# Patient Record
Sex: Male | Born: 1955 | Race: White | Hispanic: No | Marital: Married | Smoking: Former smoker
Health system: Southern US, Community
[De-identification: ages and names within clinical notes are randomized; demographics above are authoritative.]

## PROBLEM LIST (undated history)

## (undated) DIAGNOSIS — M1990 Unspecified osteoarthritis, unspecified site: Secondary | ICD-10-CM

## (undated) DIAGNOSIS — R3129 Other microscopic hematuria: Secondary | ICD-10-CM

## (undated) DIAGNOSIS — Z950 Presence of cardiac pacemaker: Secondary | ICD-10-CM

## (undated) DIAGNOSIS — E785 Hyperlipidemia, unspecified: Secondary | ICD-10-CM

## (undated) DIAGNOSIS — C61 Malignant neoplasm of prostate: Secondary | ICD-10-CM

## (undated) DIAGNOSIS — G7000 Myasthenia gravis without (acute) exacerbation: Secondary | ICD-10-CM

## (undated) DIAGNOSIS — M109 Gout, unspecified: Secondary | ICD-10-CM

## (undated) DIAGNOSIS — Z8619 Personal history of other infectious and parasitic diseases: Secondary | ICD-10-CM

## (undated) DIAGNOSIS — Z22322 Carrier or suspected carrier of Methicillin resistant Staphylococcus aureus: Secondary | ICD-10-CM

## (undated) DIAGNOSIS — I4891 Unspecified atrial fibrillation: Secondary | ICD-10-CM

## (undated) DIAGNOSIS — I10 Essential (primary) hypertension: Secondary | ICD-10-CM

## (undated) DIAGNOSIS — M21371 Foot drop, right foot: Secondary | ICD-10-CM

## (undated) DIAGNOSIS — R011 Cardiac murmur, unspecified: Secondary | ICD-10-CM

## (undated) DIAGNOSIS — R972 Elevated prostate specific antigen [PSA]: Secondary | ICD-10-CM

## (undated) DIAGNOSIS — H269 Unspecified cataract: Secondary | ICD-10-CM

## (undated) DIAGNOSIS — T7840XA Allergy, unspecified, initial encounter: Secondary | ICD-10-CM

## (undated) HISTORY — DX: Presence of cardiac pacemaker: Z95.0

## (undated) HISTORY — PX: COLONOSCOPY W/ POLYPECTOMY: SHX1380

## (undated) HISTORY — DX: Unspecified cataract: H26.9

## (undated) HISTORY — DX: Essential (primary) hypertension: I10

## (undated) HISTORY — PX: CARPAL TUNNEL RELEASE: SHX101

## (undated) HISTORY — DX: Hyperlipidemia, unspecified: E78.5

## (undated) HISTORY — DX: Gout, unspecified: M10.9

## (undated) HISTORY — PX: MOHS SURGERY: SUR867

## (undated) HISTORY — DX: Malignant neoplasm of prostate: C61

## (undated) HISTORY — DX: Other microscopic hematuria: R31.29

## (undated) HISTORY — DX: Cardiac murmur, unspecified: R01.1

## (undated) HISTORY — PX: SPINE SURGERY: SHX786

## (undated) HISTORY — DX: Personal history of other infectious and parasitic diseases: Z86.19

## (undated) HISTORY — PX: JOINT REPLACEMENT: SHX530

## (undated) HISTORY — PX: MELANOMA EXCISION: SHX5266

## (undated) HISTORY — DX: Allergy, unspecified, initial encounter: T78.40XA

## (undated) HISTORY — DX: Unspecified osteoarthritis, unspecified site: M19.90

## (undated) HISTORY — DX: Elevated prostate specific antigen (PSA): R97.20

## (undated) HISTORY — PX: EYE SURGERY: SHX253

---

## 1986-11-01 HISTORY — PX: KNEE SURGERY: SHX244

## 2000-12-02 DIAGNOSIS — I10 Essential (primary) hypertension: Secondary | ICD-10-CM | POA: Insufficient documentation

## 2003-07-22 DIAGNOSIS — G7000 Myasthenia gravis without (acute) exacerbation: Secondary | ICD-10-CM

## 2006-11-30 ENCOUNTER — Ambulatory Visit: Payer: Self-pay | Admitting: Gastroenterology

## 2009-05-08 ENCOUNTER — Ambulatory Visit: Payer: Self-pay | Admitting: Family Medicine

## 2012-12-28 HISTORY — PX: BASAL CELL CARCINOMA EXCISION: SHX1214

## 2014-10-16 DIAGNOSIS — I442 Atrioventricular block, complete: Secondary | ICD-10-CM | POA: Insufficient documentation

## 2014-10-17 LAB — BASIC METABOLIC PANEL
BUN: 17 mg/dL (ref 4–21)
CREATININE: 1.3 mg/dL (ref 0.6–1.3)
Glucose: 115 mg/dL
POTASSIUM: 4.6 mmol/L (ref 3.4–5.3)
SODIUM: 143 mmol/L (ref 137–147)

## 2014-10-17 LAB — LIPID PANEL
Cholesterol: 163 mg/dL (ref 0–200)
HDL: 37 mg/dL (ref 35–70)
LDL Cholesterol: 98 mg/dL
Triglycerides: 138 mg/dL (ref 40–160)

## 2014-10-17 LAB — TSH: TSH: 3.38 u[IU]/mL (ref 0.41–5.90)

## 2014-11-21 DIAGNOSIS — R001 Bradycardia, unspecified: Secondary | ICD-10-CM | POA: Insufficient documentation

## 2014-11-21 DIAGNOSIS — R0681 Apnea, not elsewhere classified: Secondary | ICD-10-CM | POA: Insufficient documentation

## 2014-11-27 LAB — PSA: PSA: 5.5

## 2014-12-11 ENCOUNTER — Ambulatory Visit: Payer: Self-pay | Admitting: Internal Medicine

## 2014-12-23 ENCOUNTER — Ambulatory Visit: Payer: Self-pay | Admitting: Cardiology

## 2014-12-23 DIAGNOSIS — I10 Essential (primary) hypertension: Secondary | ICD-10-CM

## 2014-12-24 HISTORY — PX: INSERT / REPLACE / REMOVE PACEMAKER: SUR710

## 2014-12-25 ENCOUNTER — Ambulatory Visit: Payer: Self-pay | Admitting: Cardiology

## 2014-12-30 DIAGNOSIS — Z950 Presence of cardiac pacemaker: Secondary | ICD-10-CM | POA: Insufficient documentation

## 2015-02-24 ENCOUNTER — Ambulatory Visit: Admit: 2015-02-24 | Payer: Self-pay | Admitting: Radiation Oncology

## 2015-03-01 LAB — PSA: PSA: 5.1

## 2015-03-02 NOTE — Op Note (Signed)
PATIENT NAME:  Garrett Woods, Garrett Woods MR#:  [id number] DATE OF BIRTH:  September 22, 1956  DATE OF PROCEDURE:  12/25/2014  PRIMARY CARE PHYSICIAN:  Lelon Huh, MD  PREOPERATIVE DIAGNOSIS: Type II second-degree atrioventricular block.   PROCEDURE: Dual-chamber pacemaker implantation.   POSTPROCEDURE DIAGNOSIS: Atrial sensing with ventricular pacing.   INDICATION: The patient is a 59 year old gentleman with a history of near syncope, palpitations, and exertional dyspnea. The patient was noted to be bradycardic with type II second degree AV block.   The risks, benefits, and alternatives of permanent pacemaker implantation were explained to the patient and informed written consent was obtained.   DESCRIPTION OF PROCEDURE: He was brought to the Operating Room in a fasting state. The left pectoral region was prepped and draped in the usual sterile manner. Anesthesia was obtained with 1% Xylocaine locally. A 6 cm incision was performed over the left pectoral region. The pacemaker pocket was generated by electrocautery and blunt dissection. Access was obtained to the left subclavian vein by fine needle aspiration. MRI compatible ventricular and atrial leads were positioned into the right ventricular apical septum and right atrial appendage after fluoroscopic guidance. After thresholds were obtained, the leads were sutured in place. The leads were connected to a dual chamber rate-responsive MRI compatible pacemaker generator ([employer] [id number] Advisa). The pacemaker pocket was irrigated with 10 mL solution. The new pacemaker generator was positioned in the pocket. The pocket was closed with 2-0 and 4-0 Vicryl, respectively. Steri-Strips as pressure dressing were applied.   ____________________________ Isaias Cowman, MD ap:mc D: 12/25/2014 13:36:57 ET T: 12/26/2014 08:13:35 ET JOB#: [id number]  cc: Isaias Cowman, MD, <Dictator> Isaias Cowman MD ELECTRONICALLY SIGNED 01/07/2015 10:08

## 2015-03-06 ENCOUNTER — Ambulatory Visit: Payer: 59 | Admitting: Radiation Oncology

## 2015-03-06 ENCOUNTER — Encounter (INDEPENDENT_AMBULATORY_CARE_PROVIDER_SITE_OTHER): Payer: Self-pay

## 2015-03-06 ENCOUNTER — Ambulatory Visit
Admission: RE | Admit: 2015-03-06 | Discharge: 2015-03-06 | Disposition: A | Discharge status: Home / Self Care | Payer: 59 | Source: Ambulatory Visit | Attending: Radiation Oncology | Admitting: Radiation Oncology

## 2015-03-06 ENCOUNTER — Encounter: Payer: Self-pay | Admitting: Radiation Oncology

## 2015-03-06 VITALS — BP 162/93 | HR 96 | Temp 98.1°F | Resp 20 | Ht 72.0 in | Wt 357.1 lb

## 2015-03-06 DIAGNOSIS — C61 Malignant neoplasm of prostate: Secondary | ICD-10-CM | POA: Insufficient documentation

## 2015-03-06 DIAGNOSIS — Z510 Encounter for antineoplastic radiation therapy: Secondary | ICD-10-CM | POA: Insufficient documentation

## 2015-03-06 NOTE — Consult Note (Signed)
Radiation Oncology NEW PATIENT EVALUATION  Name: Garrett Woods  MRN: [id number]  Date:   03/06/2015     DOB: September 16, 1956   This 59 y.o. male patient presents to the clinic for initial evaluation of Prostate cancer.  REFERRING PHYSICIAN: Birdie Sons, MD  CHIEF COMPLAINT:  Chief Complaint  Patient presents with  . Prostate Cancer    Initial evaluation of prostate caner    DIAGNOSIS: The encounter diagnosis was Prostate cancer.   PREVIOUS INVESTIGATIONS:  Pathology Report Imaging Referral Report All reviewed HPI: Patient is a 59 year old male with very little your lower urinary tract symptoms presented with an elevated PSA of 5. 10/01/2014. Digital rectal exam was difficult because the patient is morbidly obese. He went on to have transrectal ultrasound-guided biopsy showing 3 of 12 cores positive for Gleason 6 (3+3) adenocarcinoma. Patient does have a pacemaker. He is also had melanoma excision in the past. Treatment options by urology have been recommended. His prostate volume is 72 g making it over the limit for seed implantation. Based on his body habitus pacemaker recognition for robotic prostatectomy was not made. He is seen today for consideration of radiation treatments.  PLANNED TREATMENT REGIMEN: Image guided radiation therapy using I am RT  PAST MEDICAL HISTORY:  has a past medical history of Arthritis; Elevated PSA; Prostate cancer; Microscopic hematuria; Gout; Heart murmur; Hypertension; Hyperlipemia; and Pacemaker.    PAST SURGICAL HISTORY:  Past Surgical History  Procedure Laterality Date  . Knee surgery Right   . Melanoma excision      FAMILY HISTORY: family history is not on file.  SOCIAL HISTORY:  reports that he has quit smoking. He does not have any smokeless tobacco history on file. He reports that he drinks alcohol.  ALLERGIES: Review of patient's allergies indicates no known allergies.  MEDICATIONS:  Current Outpatient Prescriptions   Medication Sig Dispense Refill  . diclofenac (VOLTAREN) 50 MG EC tablet Take 50 mg by mouth daily as needed.    . Glucosamine-Chondroitin 250-200 MG TABS Take 1 tablet by mouth daily.    . imiquimod (ALDARA) 5 % cream Apply 1 application topically daily.    Marland Kitchen losartan-hydrochlorothiazide (HYZAAR) 100-25 MG per tablet Take 1 tablet by mouth daily.    Marland Kitchen lovastatin (MEVACOR) 40 MG tablet Take 40 mg by mouth at bedtime.     No current facility-administered medications for this encounter.    ECOG PERFORMANCE STATUS:  0 - Asymptomatic  REVIEW OF SYSTEMS:  Patient denies any weight loss, fatigue, weakness, fever, chills or night sweats. Patient denies any loss of vision, blurred vision. Patient denies any ringing  of the ears or hearing loss. No irregular heartbeat. Patient denies heart murmur or history of fainting. Patient denies any chest pain or pain radiating to her upper extremities. Patient denies any shortness of breath, difficulty breathing at night, cough or hemoptysis. Patient denies any swelling in the lower legs. Patient denies any nausea vomiting, vomiting of blood, or coffee ground material in the vomitus. Patient denies any stomach pain. Patient states has had normal bowel movements no significant constipation or diarrhea. Patient denies any dysuria, hematuria or significant nocturia. Patient denies any problems walking, swelling in the joints or loss of balance. Patient denies any skin changes, loss of hair or loss of weight. Patient denies any excessive worrying or anxiety or significant depression. Patient denies any problems with insomnia. Patient denies excessive thirst, polyuria, polydipsia. Patient denies any swollen glands, patient denies easy bruising or easy bleeding. Patient  denies any recent infections, allergies or URI. Patient "s visual fields have not changed significantly in recent time.    PHYSICAL EXAM: BP 162/93 mmHg  Pulse 96  Temp(Src) 98.1 F (36.7 C)  Resp 20   Ht 6' (1.829 m)  Wt 357 lb 2.3 oz (162 kg)  BMI 48.43 kg/m2 Well-developed obese male in NAD. Lungs are clear to A&P he has a 2/6 systolic ejection murmur. Abdomen is benign. On rectal exam rectal sphincter tone is good. Prostate exam is difficult secondary to his body habitus. No other rectal abnormalities are identified. Well-developed well-nourished patient in NAD. HEENT reveals PERLA, EOMI, discs not visualized.  Oral cavity is clear. No oral mucosal lesions are identified. Neck is clear without evidence of cervical or supraclavicular adenopathy. Lungs are clear to A&P. Cardiac examination is essentially unremarkable with regular rate and rhythm without murmur rub or thrill. Abdomen is benign with no organomegaly or masses noted. Motor sensory and DTR levels are equal and symmetric in the upper and lower extremities. Cranial nerves II through XII are grossly intact. Proprioception is intact. No peripheral adenopathy or edema is identified. No motor or sensory levels are noted. Crude visual fields are within normal range.   LABORATORY DATA:  No results found for this or any previous visit (from the past 72 hour(s)).   RADIOLOGY RESULTS: No results found.  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  PLAN: At this time we have discussed observation and surgical alternatives. Based on neurology's recommendation do not feel he is in good candidate for surgical resection based on his body habitus and pacemaker. Prostate volume is over the limit for seed implantation in and and I would recommend image guided radiation therapy up to 8000 cGy over 8 weeks using IMRT treatment planning and delivery. I have asked urology to place gold fiduciary markers for daily image guided therapy. Risks and benefits of treatment including increasing lower urinary tract symptoms, possible diarrhea, fatigue, alteration of blood counts, all were explained in  detail to the patient. He seems to comprehend my treatment plan well. I have ordered and scheduled CT simulation shortly after markers are placed.  I would like to take this opportunity for allowing me to participate in the care of your patient.Armstead Peaks., MD

## 2015-03-18 ENCOUNTER — Encounter: Payer: Self-pay | Admitting: *Deleted

## 2015-03-20 ENCOUNTER — Ambulatory Visit
Admission: RE | Admit: 2015-03-20 | Discharge: 2015-03-20 | Disposition: A | Discharge status: Home / Self Care | Payer: 59 | Source: Ambulatory Visit | Attending: Radiation Oncology | Admitting: Radiation Oncology

## 2015-03-20 DIAGNOSIS — Z510 Encounter for antineoplastic radiation therapy: Secondary | ICD-10-CM | POA: Diagnosis not present

## 2015-03-20 DIAGNOSIS — C61 Malignant neoplasm of prostate: Secondary | ICD-10-CM | POA: Diagnosis present

## 2015-03-24 ENCOUNTER — Ambulatory Visit
Admission: RE | Admit: 2015-03-24 | Discharge: 2015-03-24 | Disposition: A | Discharge status: Home / Self Care | Payer: 59 | Source: Ambulatory Visit | Attending: Family Medicine | Admitting: Family Medicine

## 2015-03-24 ENCOUNTER — Other Ambulatory Visit: Payer: Self-pay | Admitting: Family Medicine

## 2015-03-24 DIAGNOSIS — M5431 Sciatica, right side: Secondary | ICD-10-CM

## 2015-03-24 DIAGNOSIS — M5430 Sciatica, unspecified side: Secondary | ICD-10-CM | POA: Diagnosis present

## 2015-03-24 DIAGNOSIS — M5432 Sciatica, left side: Principal | ICD-10-CM

## 2015-03-25 DIAGNOSIS — C61 Malignant neoplasm of prostate: Secondary | ICD-10-CM | POA: Diagnosis not present

## 2015-03-27 ENCOUNTER — Encounter: Payer: Self-pay | Admitting: *Deleted

## 2015-03-28 ENCOUNTER — Other Ambulatory Visit: Payer: Self-pay | Admitting: *Deleted

## 2015-03-28 DIAGNOSIS — C61 Malignant neoplasm of prostate: Secondary | ICD-10-CM

## 2015-04-01 ENCOUNTER — Ambulatory Visit
Admission: RE | Admit: 2015-04-01 | Discharge: 2015-04-01 | Disposition: A | Discharge status: Home / Self Care | Payer: 59 | Source: Ambulatory Visit | Attending: Radiation Oncology | Admitting: Radiation Oncology

## 2015-04-01 DIAGNOSIS — C61 Malignant neoplasm of prostate: Secondary | ICD-10-CM | POA: Diagnosis not present

## 2015-04-02 ENCOUNTER — Ambulatory Visit
Admission: RE | Admit: 2015-04-02 | Discharge: 2015-04-02 | Disposition: A | Discharge status: Home / Self Care | Payer: 59 | Source: Ambulatory Visit | Attending: Radiation Oncology | Admitting: Radiation Oncology

## 2015-04-02 DIAGNOSIS — C61 Malignant neoplasm of prostate: Secondary | ICD-10-CM | POA: Diagnosis not present

## 2015-04-03 ENCOUNTER — Ambulatory Visit
Admission: RE | Admit: 2015-04-03 | Discharge: 2015-04-03 | Disposition: A | Discharge status: Home / Self Care | Payer: 59 | Source: Ambulatory Visit | Attending: Radiation Oncology | Admitting: Radiation Oncology

## 2015-04-03 DIAGNOSIS — C61 Malignant neoplasm of prostate: Secondary | ICD-10-CM | POA: Diagnosis not present

## 2015-04-04 ENCOUNTER — Ambulatory Visit
Admission: RE | Admit: 2015-04-04 | Discharge: 2015-04-04 | Disposition: A | Discharge status: Home / Self Care | Payer: 59 | Source: Ambulatory Visit | Attending: Radiation Oncology | Admitting: Radiation Oncology

## 2015-04-04 ENCOUNTER — Other Ambulatory Visit: Payer: Self-pay | Admitting: Family Medicine

## 2015-04-04 DIAGNOSIS — C61 Malignant neoplasm of prostate: Secondary | ICD-10-CM | POA: Diagnosis not present

## 2015-04-07 ENCOUNTER — Ambulatory Visit
Admission: RE | Admit: 2015-04-07 | Discharge: 2015-04-07 | Disposition: A | Discharge status: Home / Self Care | Payer: 59 | Source: Ambulatory Visit | Attending: Radiation Oncology | Admitting: Radiation Oncology

## 2015-04-07 DIAGNOSIS — C61 Malignant neoplasm of prostate: Secondary | ICD-10-CM | POA: Diagnosis not present

## 2015-04-08 ENCOUNTER — Encounter: Payer: Self-pay | Admitting: Radiation Oncology

## 2015-04-08 ENCOUNTER — Ambulatory Visit
Admission: RE | Admit: 2015-04-08 | Discharge: 2015-04-08 | Disposition: A | Discharge status: Home / Self Care | Payer: 59 | Source: Ambulatory Visit | Attending: Radiation Oncology | Admitting: Radiation Oncology

## 2015-04-08 DIAGNOSIS — C61 Malignant neoplasm of prostate: Secondary | ICD-10-CM | POA: Diagnosis not present

## 2015-04-09 ENCOUNTER — Ambulatory Visit
Admission: RE | Admit: 2015-04-09 | Discharge: 2015-04-09 | Disposition: A | Discharge status: Home / Self Care | Payer: 59 | Source: Ambulatory Visit | Attending: Radiation Oncology | Admitting: Radiation Oncology

## 2015-04-09 DIAGNOSIS — C61 Malignant neoplasm of prostate: Secondary | ICD-10-CM | POA: Diagnosis not present

## 2015-04-10 ENCOUNTER — Inpatient Hospital Stay: Payer: 59 | Attending: Radiation Oncology

## 2015-04-10 ENCOUNTER — Ambulatory Visit
Admission: RE | Admit: 2015-04-10 | Discharge: 2015-04-10 | Disposition: A | Discharge status: Home / Self Care | Payer: 59 | Source: Ambulatory Visit | Attending: Radiation Oncology | Admitting: Radiation Oncology

## 2015-04-10 DIAGNOSIS — C61 Malignant neoplasm of prostate: Secondary | ICD-10-CM | POA: Diagnosis present

## 2015-04-10 LAB — CBC
HEMATOCRIT: 41.7 % (ref 40.0–52.0)
Hemoglobin: 14.4 g/dL (ref 13.0–18.0)
MCH: 32.2 pg (ref 26.0–34.0)
MCHC: 34.6 g/dL (ref 32.0–36.0)
MCV: 92.9 fL (ref 80.0–100.0)
Platelets: 204 10*3/uL (ref 150–440)
RBC: 4.49 MIL/uL (ref 4.40–5.90)
RDW: 14 % (ref 11.5–14.5)
WBC: 6.4 10*3/uL (ref 3.8–10.6)

## 2015-04-11 ENCOUNTER — Ambulatory Visit
Admission: RE | Admit: 2015-04-11 | Discharge: 2015-04-11 | Disposition: A | Discharge status: Home / Self Care | Payer: 59 | Source: Ambulatory Visit | Attending: Radiation Oncology | Admitting: Radiation Oncology

## 2015-04-11 DIAGNOSIS — C61 Malignant neoplasm of prostate: Secondary | ICD-10-CM | POA: Diagnosis not present

## 2015-04-14 ENCOUNTER — Ambulatory Visit
Admission: RE | Admit: 2015-04-14 | Discharge: 2015-04-14 | Disposition: A | Discharge status: Home / Self Care | Payer: 59 | Source: Ambulatory Visit | Attending: Radiation Oncology | Admitting: Radiation Oncology

## 2015-04-14 DIAGNOSIS — C61 Malignant neoplasm of prostate: Secondary | ICD-10-CM | POA: Diagnosis not present

## 2015-04-15 ENCOUNTER — Ambulatory Visit
Admission: RE | Admit: 2015-04-15 | Discharge: 2015-04-15 | Disposition: A | Discharge status: Home / Self Care | Payer: 59 | Source: Ambulatory Visit | Attending: Radiation Oncology | Admitting: Radiation Oncology

## 2015-04-15 DIAGNOSIS — C61 Malignant neoplasm of prostate: Secondary | ICD-10-CM | POA: Diagnosis not present

## 2015-04-16 ENCOUNTER — Ambulatory Visit
Admission: RE | Admit: 2015-04-16 | Discharge: 2015-04-16 | Disposition: A | Discharge status: Home / Self Care | Payer: 59 | Source: Ambulatory Visit | Attending: Radiation Oncology | Admitting: Radiation Oncology

## 2015-04-16 DIAGNOSIS — C61 Malignant neoplasm of prostate: Secondary | ICD-10-CM | POA: Diagnosis not present

## 2015-04-17 ENCOUNTER — Ambulatory Visit
Admission: RE | Admit: 2015-04-17 | Discharge: 2015-04-17 | Disposition: A | Discharge status: Home / Self Care | Payer: 59 | Source: Ambulatory Visit | Attending: Radiation Oncology | Admitting: Radiation Oncology

## 2015-04-17 DIAGNOSIS — C61 Malignant neoplasm of prostate: Secondary | ICD-10-CM | POA: Diagnosis not present

## 2015-04-18 ENCOUNTER — Ambulatory Visit
Admission: RE | Admit: 2015-04-18 | Discharge: 2015-04-18 | Disposition: A | Discharge status: Home / Self Care | Payer: 59 | Source: Ambulatory Visit | Attending: Radiation Oncology | Admitting: Radiation Oncology

## 2015-04-18 DIAGNOSIS — C61 Malignant neoplasm of prostate: Secondary | ICD-10-CM | POA: Diagnosis not present

## 2015-04-21 ENCOUNTER — Ambulatory Visit
Admission: RE | Admit: 2015-04-21 | Discharge: 2015-04-21 | Disposition: A | Discharge status: Home / Self Care | Payer: 59 | Source: Ambulatory Visit | Attending: Radiation Oncology | Admitting: Radiation Oncology

## 2015-04-21 DIAGNOSIS — C61 Malignant neoplasm of prostate: Secondary | ICD-10-CM | POA: Diagnosis not present

## 2015-04-22 ENCOUNTER — Ambulatory Visit
Admission: RE | Admit: 2015-04-22 | Discharge: 2015-04-22 | Disposition: A | Discharge status: Home / Self Care | Payer: 59 | Source: Ambulatory Visit | Attending: Radiation Oncology | Admitting: Radiation Oncology

## 2015-04-22 DIAGNOSIS — C61 Malignant neoplasm of prostate: Secondary | ICD-10-CM | POA: Diagnosis not present

## 2015-04-23 ENCOUNTER — Ambulatory Visit
Admission: RE | Admit: 2015-04-23 | Discharge: 2015-04-23 | Disposition: A | Discharge status: Home / Self Care | Payer: 59 | Source: Ambulatory Visit | Attending: Radiation Oncology | Admitting: Radiation Oncology

## 2015-04-23 DIAGNOSIS — C61 Malignant neoplasm of prostate: Secondary | ICD-10-CM | POA: Diagnosis not present

## 2015-04-24 ENCOUNTER — Inpatient Hospital Stay: Payer: 59

## 2015-04-24 ENCOUNTER — Ambulatory Visit
Admission: RE | Admit: 2015-04-24 | Discharge: 2015-04-24 | Disposition: A | Discharge status: Home / Self Care | Payer: 59 | Source: Ambulatory Visit | Attending: Radiation Oncology | Admitting: Radiation Oncology

## 2015-04-24 DIAGNOSIS — C61 Malignant neoplasm of prostate: Secondary | ICD-10-CM

## 2015-04-24 LAB — CBC
HEMATOCRIT: 42.8 % (ref 40.0–52.0)
Hemoglobin: 14.5 g/dL (ref 13.0–18.0)
MCH: 31.8 pg (ref 26.0–34.0)
MCHC: 33.8 g/dL (ref 32.0–36.0)
MCV: 93.9 fL (ref 80.0–100.0)
PLATELETS: 189 10*3/uL (ref 150–440)
RBC: 4.55 MIL/uL (ref 4.40–5.90)
RDW: 13.7 % (ref 11.5–14.5)
WBC: 6.6 10*3/uL (ref 3.8–10.6)

## 2015-04-25 ENCOUNTER — Ambulatory Visit
Admission: RE | Admit: 2015-04-25 | Discharge: 2015-04-25 | Disposition: A | Discharge status: Home / Self Care | Payer: 59 | Source: Ambulatory Visit | Attending: Radiation Oncology | Admitting: Radiation Oncology

## 2015-04-25 DIAGNOSIS — C61 Malignant neoplasm of prostate: Secondary | ICD-10-CM | POA: Diagnosis not present

## 2015-04-28 ENCOUNTER — Ambulatory Visit
Admission: RE | Admit: 2015-04-28 | Discharge: 2015-04-28 | Disposition: A | Discharge status: Home / Self Care | Payer: 59 | Source: Ambulatory Visit | Attending: Radiation Oncology | Admitting: Radiation Oncology

## 2015-04-28 DIAGNOSIS — C61 Malignant neoplasm of prostate: Secondary | ICD-10-CM | POA: Diagnosis not present

## 2015-04-29 ENCOUNTER — Ambulatory Visit
Admission: RE | Admit: 2015-04-29 | Discharge: 2015-04-29 | Disposition: A | Discharge status: Home / Self Care | Payer: 59 | Source: Ambulatory Visit | Attending: Radiation Oncology | Admitting: Radiation Oncology

## 2015-04-29 DIAGNOSIS — C61 Malignant neoplasm of prostate: Secondary | ICD-10-CM | POA: Diagnosis not present

## 2015-04-30 ENCOUNTER — Ambulatory Visit
Admission: RE | Admit: 2015-04-30 | Discharge: 2015-04-30 | Disposition: A | Discharge status: Home / Self Care | Payer: 59 | Source: Ambulatory Visit | Attending: Radiation Oncology | Admitting: Radiation Oncology

## 2015-04-30 DIAGNOSIS — C61 Malignant neoplasm of prostate: Secondary | ICD-10-CM | POA: Diagnosis not present

## 2015-05-01 ENCOUNTER — Ambulatory Visit
Admission: RE | Admit: 2015-05-01 | Discharge: 2015-05-01 | Disposition: A | Discharge status: Home / Self Care | Payer: 59 | Source: Ambulatory Visit | Attending: Radiation Oncology | Admitting: Radiation Oncology

## 2015-05-01 DIAGNOSIS — C61 Malignant neoplasm of prostate: Secondary | ICD-10-CM | POA: Diagnosis not present

## 2015-05-02 ENCOUNTER — Ambulatory Visit
Admission: RE | Admit: 2015-05-02 | Discharge: 2015-05-02 | Disposition: A | Discharge status: Home / Self Care | Payer: 59 | Source: Ambulatory Visit | Attending: Radiation Oncology | Admitting: Radiation Oncology

## 2015-05-02 DIAGNOSIS — C61 Malignant neoplasm of prostate: Secondary | ICD-10-CM | POA: Diagnosis not present

## 2015-05-06 ENCOUNTER — Ambulatory Visit
Admission: RE | Admit: 2015-05-06 | Discharge: 2015-05-06 | Disposition: A | Discharge status: Home / Self Care | Payer: 59 | Source: Ambulatory Visit | Attending: Radiation Oncology | Admitting: Radiation Oncology

## 2015-05-06 ENCOUNTER — Other Ambulatory Visit: Payer: Self-pay | Admitting: Family Medicine

## 2015-05-06 DIAGNOSIS — C61 Malignant neoplasm of prostate: Secondary | ICD-10-CM | POA: Diagnosis not present

## 2015-05-07 ENCOUNTER — Ambulatory Visit
Admission: RE | Admit: 2015-05-07 | Discharge: 2015-05-07 | Disposition: A | Discharge status: Home / Self Care | Payer: 59 | Source: Ambulatory Visit | Attending: Radiation Oncology | Admitting: Radiation Oncology

## 2015-05-07 DIAGNOSIS — C61 Malignant neoplasm of prostate: Secondary | ICD-10-CM | POA: Diagnosis not present

## 2015-05-08 ENCOUNTER — Inpatient Hospital Stay: Payer: 59 | Attending: Radiation Oncology

## 2015-05-08 ENCOUNTER — Ambulatory Visit
Admission: RE | Admit: 2015-05-08 | Discharge: 2015-05-08 | Disposition: A | Discharge status: Home / Self Care | Payer: 59 | Source: Ambulatory Visit | Attending: Radiation Oncology | Admitting: Radiation Oncology

## 2015-05-08 DIAGNOSIS — C61 Malignant neoplasm of prostate: Secondary | ICD-10-CM | POA: Insufficient documentation

## 2015-05-08 LAB — CBC
HCT: 41.8 % (ref 40.0–52.0)
HEMOGLOBIN: 14.2 g/dL (ref 13.0–18.0)
MCH: 31.9 pg (ref 26.0–34.0)
MCHC: 34 g/dL (ref 32.0–36.0)
MCV: 93.9 fL (ref 80.0–100.0)
Platelets: 190 10*3/uL (ref 150–440)
RBC: 4.45 MIL/uL (ref 4.40–5.90)
RDW: 14.1 % (ref 11.5–14.5)
WBC: 5.6 10*3/uL (ref 3.8–10.6)

## 2015-05-09 ENCOUNTER — Ambulatory Visit
Admission: RE | Admit: 2015-05-09 | Discharge: 2015-05-09 | Disposition: A | Discharge status: Home / Self Care | Payer: 59 | Source: Ambulatory Visit | Attending: Radiation Oncology | Admitting: Radiation Oncology

## 2015-05-09 DIAGNOSIS — C61 Malignant neoplasm of prostate: Secondary | ICD-10-CM | POA: Diagnosis not present

## 2015-05-12 ENCOUNTER — Ambulatory Visit
Admission: RE | Admit: 2015-05-12 | Discharge: 2015-05-12 | Disposition: A | Discharge status: Home / Self Care | Payer: 59 | Source: Ambulatory Visit | Attending: Radiation Oncology | Admitting: Radiation Oncology

## 2015-05-12 DIAGNOSIS — C61 Malignant neoplasm of prostate: Secondary | ICD-10-CM | POA: Diagnosis not present

## 2015-05-13 ENCOUNTER — Ambulatory Visit
Admission: RE | Admit: 2015-05-13 | Discharge: 2015-05-13 | Disposition: A | Discharge status: Home / Self Care | Payer: 59 | Source: Ambulatory Visit | Attending: Radiation Oncology | Admitting: Radiation Oncology

## 2015-05-13 DIAGNOSIS — C61 Malignant neoplasm of prostate: Secondary | ICD-10-CM | POA: Diagnosis not present

## 2015-05-14 ENCOUNTER — Ambulatory Visit
Admission: RE | Admit: 2015-05-14 | Discharge: 2015-05-14 | Disposition: A | Discharge status: Home / Self Care | Payer: 59 | Source: Ambulatory Visit | Attending: Radiation Oncology | Admitting: Radiation Oncology

## 2015-05-14 DIAGNOSIS — C61 Malignant neoplasm of prostate: Secondary | ICD-10-CM | POA: Diagnosis not present

## 2015-05-15 ENCOUNTER — Ambulatory Visit
Admission: RE | Admit: 2015-05-15 | Discharge: 2015-05-15 | Disposition: A | Discharge status: Home / Self Care | Payer: 59 | Source: Ambulatory Visit | Attending: Radiation Oncology | Admitting: Radiation Oncology

## 2015-05-15 DIAGNOSIS — C61 Malignant neoplasm of prostate: Secondary | ICD-10-CM | POA: Diagnosis not present

## 2015-05-16 ENCOUNTER — Ambulatory Visit
Admission: RE | Admit: 2015-05-16 | Discharge: 2015-05-16 | Disposition: A | Discharge status: Home / Self Care | Payer: 59 | Source: Ambulatory Visit | Attending: Radiation Oncology | Admitting: Radiation Oncology

## 2015-05-16 DIAGNOSIS — C61 Malignant neoplasm of prostate: Secondary | ICD-10-CM | POA: Diagnosis not present

## 2015-05-19 ENCOUNTER — Ambulatory Visit
Admission: RE | Admit: 2015-05-19 | Discharge: 2015-05-19 | Disposition: A | Discharge status: Home / Self Care | Payer: 59 | Source: Ambulatory Visit | Attending: Radiation Oncology | Admitting: Radiation Oncology

## 2015-05-19 DIAGNOSIS — C61 Malignant neoplasm of prostate: Secondary | ICD-10-CM | POA: Diagnosis not present

## 2015-05-20 ENCOUNTER — Ambulatory Visit
Admission: RE | Admit: 2015-05-20 | Discharge: 2015-05-20 | Disposition: A | Discharge status: Home / Self Care | Payer: 59 | Source: Ambulatory Visit | Attending: Radiation Oncology | Admitting: Radiation Oncology

## 2015-05-20 DIAGNOSIS — C61 Malignant neoplasm of prostate: Secondary | ICD-10-CM | POA: Diagnosis not present

## 2015-05-21 ENCOUNTER — Ambulatory Visit
Admission: RE | Admit: 2015-05-21 | Discharge: 2015-05-21 | Disposition: A | Discharge status: Home / Self Care | Payer: 59 | Source: Ambulatory Visit | Attending: Radiation Oncology | Admitting: Radiation Oncology

## 2015-05-21 DIAGNOSIS — C61 Malignant neoplasm of prostate: Secondary | ICD-10-CM | POA: Diagnosis not present

## 2015-05-22 ENCOUNTER — Inpatient Hospital Stay: Payer: 59

## 2015-05-22 ENCOUNTER — Ambulatory Visit
Admission: RE | Admit: 2015-05-22 | Discharge: 2015-05-22 | Disposition: A | Discharge status: Home / Self Care | Payer: 59 | Source: Ambulatory Visit | Attending: Radiation Oncology | Admitting: Radiation Oncology

## 2015-05-22 DIAGNOSIS — C61 Malignant neoplasm of prostate: Secondary | ICD-10-CM

## 2015-05-22 LAB — CBC
HCT: 40.4 % (ref 40.0–52.0)
HEMOGLOBIN: 13.7 g/dL (ref 13.0–18.0)
MCH: 32.3 pg (ref 26.0–34.0)
MCHC: 34 g/dL (ref 32.0–36.0)
MCV: 95 fL (ref 80.0–100.0)
Platelets: 203 10*3/uL (ref 150–440)
RBC: 4.25 MIL/uL — ABNORMAL LOW (ref 4.40–5.90)
RDW: 14.4 % (ref 11.5–14.5)
WBC: 5.5 10*3/uL (ref 3.8–10.6)

## 2015-05-23 ENCOUNTER — Ambulatory Visit
Admission: RE | Admit: 2015-05-23 | Discharge: 2015-05-23 | Disposition: A | Discharge status: Home / Self Care | Payer: 59 | Source: Ambulatory Visit | Attending: Radiation Oncology | Admitting: Radiation Oncology

## 2015-05-23 DIAGNOSIS — C61 Malignant neoplasm of prostate: Secondary | ICD-10-CM | POA: Diagnosis not present

## 2015-05-26 ENCOUNTER — Ambulatory Visit
Admission: RE | Admit: 2015-05-26 | Discharge: 2015-05-26 | Disposition: A | Discharge status: Home / Self Care | Payer: 59 | Source: Ambulatory Visit | Attending: Radiation Oncology | Admitting: Radiation Oncology

## 2015-05-26 DIAGNOSIS — C61 Malignant neoplasm of prostate: Secondary | ICD-10-CM | POA: Diagnosis not present

## 2015-05-27 ENCOUNTER — Ambulatory Visit
Admission: RE | Admit: 2015-05-27 | Discharge: 2015-05-27 | Disposition: A | Discharge status: Home / Self Care | Payer: 59 | Source: Ambulatory Visit | Attending: Radiation Oncology | Admitting: Radiation Oncology

## 2015-05-27 DIAGNOSIS — C61 Malignant neoplasm of prostate: Secondary | ICD-10-CM | POA: Diagnosis not present

## 2015-05-28 ENCOUNTER — Ambulatory Visit
Admission: RE | Admit: 2015-05-28 | Discharge: 2015-05-28 | Disposition: A | Discharge status: Home / Self Care | Payer: 59 | Source: Ambulatory Visit | Attending: Radiation Oncology | Admitting: Radiation Oncology

## 2015-05-28 DIAGNOSIS — C61 Malignant neoplasm of prostate: Secondary | ICD-10-CM | POA: Diagnosis not present

## 2015-06-05 ENCOUNTER — Other Ambulatory Visit: Payer: Self-pay | Admitting: Family Medicine

## 2015-06-05 DIAGNOSIS — I10 Essential (primary) hypertension: Secondary | ICD-10-CM

## 2015-06-06 NOTE — Telephone Encounter (Signed)
Last OV 03/2015 

## 2015-06-11 ENCOUNTER — Ambulatory Visit (INDEPENDENT_AMBULATORY_CARE_PROVIDER_SITE_OTHER): Payer: 59 | Admitting: Family Medicine

## 2015-06-11 ENCOUNTER — Encounter: Payer: Self-pay | Admitting: Family Medicine

## 2015-06-11 VITALS — BP 138/88 | HR 96 | Temp 98.0°F | Resp 18 | Wt 362.0 lb

## 2015-06-11 DIAGNOSIS — Z85828 Personal history of other malignant neoplasm of skin: Secondary | ICD-10-CM | POA: Insufficient documentation

## 2015-06-11 DIAGNOSIS — M545 Low back pain, unspecified: Secondary | ICD-10-CM | POA: Insufficient documentation

## 2015-06-11 DIAGNOSIS — C61 Malignant neoplasm of prostate: Secondary | ICD-10-CM | POA: Diagnosis not present

## 2015-06-11 DIAGNOSIS — M5416 Radiculopathy, lumbar region: Secondary | ICD-10-CM

## 2015-06-11 DIAGNOSIS — M5417 Radiculopathy, lumbosacral region: Secondary | ICD-10-CM

## 2015-06-11 DIAGNOSIS — G7000 Myasthenia gravis without (acute) exacerbation: Secondary | ICD-10-CM

## 2015-06-11 DIAGNOSIS — Z8546 Personal history of malignant neoplasm of prostate: Secondary | ICD-10-CM | POA: Insufficient documentation

## 2015-06-11 MED ORDER — PREDNISONE 10 MG PO TABS: ORAL_TABLET | ORAL | Status: DC

## 2015-06-11 MED ORDER — CYCLOBENZAPRINE HCL 5 MG PO TABS: ORAL_TABLET | ORAL | Status: DC

## 2015-06-11 NOTE — Progress Notes (Signed)
Patient: Garrett Woods Male    DOB: 1956-04-03   59 y.o.   MRN: [id number] Visit Date: 06/11/2015  Today's Provider: Lelon Huh, MD   Chief Complaint  Patient presents with  . Back Pain   Subjective:    Back Pain This is a chronic problem. The current episode started more than 1 year ago. The problem has been gradually worsening since onset. The pain is present in the lumbar spine. The quality of the pain is described as shooting. Radiates to: right leg. Exacerbated by: walking. Associated symptoms include leg pain, numbness (right lower leg and foot) and weakness (right leg). Pertinent negatives include no abdominal pain, bladder incontinence, bowel incontinence, chest pain, dysuria, fever or headaches. Risk factors include obesity. He has tried NSAIDs for the symptoms. The treatment provided no relief.   Not as severe as it was in May when he was prescribed diclofenac and cyclobenzaprine, but has flared back up over the last few weeks.Marland Kitchen Has history of sciatica and lately having numbness radiating into right lower leg and foot. Is much weaker than left. Xrays in may found diffuse multilevel degenerative changes. Having episodes of numbness into his right lower leg.     No Known Allergies Previous Medications   ASPIRIN EC 81 MG TABLET    Take 81 mg by mouth daily.   CYCLOBENZAPRINE (FLEXERIL) 5 MG TABLET    TAKE 1-2 TABLETS EVERY EIGHT HOURS AS NEEDED FOR MUSCLE PAIN   DICLOFENAC (VOLTAREN) 50 MG EC TABLET    Take 50 mg by mouth 2 (two) times daily as needed (for back pain).    GLUCOSAMINE-CHONDROITIN 250-200 MG TABS    Take 1 tablet by mouth daily.   LOSARTAN-HYDROCHLOROTHIAZIDE (HYZAAR) 100-25 MG PER TABLET    TAKE 1 TABLET BY MOUTH DAILY   LOVASTATIN (MEVACOR) 40 MG TABLET    Take 40 mg by mouth at bedtime.    Review of Systems  Constitutional: Negative for fever.  Cardiovascular: Negative for chest pain.  Gastrointestinal: Negative for abdominal pain and bowel  incontinence.  Genitourinary: Negative for bladder incontinence and dysuria.  Musculoskeletal: Positive for back pain.  Neurological: Positive for weakness (right leg) and numbness (right lower leg and foot). Negative for headaches.    Social History  Substance Use Topics  . Smoking status: Former Smoker -- 1.00 packs/day for 20 years    Types: Cigarettes    Quit date: 11/01/2005  . Smokeless tobacco: Not on file  . Alcohol Use: 0.0 oz/week    0 Standard drinks or equivalent per week     Comment: 2 drinks every weekend   Objective:   BP 138/88 mmHg  Pulse 96  Temp(Src) 98 F (36.7 C) (Oral)  Resp 18  Wt 362 lb (164.202 kg)  SpO2 98%  Physical Exam  General appearance: alert, well developed, well nourished, cooperative and in no distress, obese Head: Normocephalic, without obvious abnormality, atraumatic Lungs: Respirations even and unlabored Extremities: No gross deformities Skin: Skin color, texture, turgor normal. No rashes seen  Psych: Appropriate mood and affect. Neurologic: Mental status: Alert, oriented to person, place, and time, thought content appropriate. Right patella tendon reflex absent. Left +1. MS RLE +4. LLE +5.      Assessment & Plan:     1. Right lower leg numbness  2. Lumbar back pain with radiculopathy affecting right lower extremity Worsening over the last few months.  - predniSONE (DELTASONE) 10 MG tablet; 6 tablets for 2 days,  then 5 for 2 days, then 4 for 2 days, then 3 for 2 days, then 2 for 2 days, then 1 for 2 days.  Dispense: 42 tablet; Refill: 0 - cyclobenzaprine (FLEXERIL) 5 MG tablet; TAKE 1-2 TABLETS EVERY EIGHT HOURS AS NEEDED FOR BACK PAIN  Dispense: 30 tablet; Refill: 0 - Ambulatory referral to Physical Therapy  If not responding to PT and prednisone will obtain MRI of spine.       Lelon Huh, MD  [facility]

## 2015-07-01 ENCOUNTER — Encounter: Payer: Self-pay | Admitting: Physical Therapy

## 2015-07-01 ENCOUNTER — Other Ambulatory Visit: Payer: Self-pay | Admitting: Family Medicine

## 2015-07-01 ENCOUNTER — Ambulatory Visit: Payer: 59 | Attending: Family Medicine | Admitting: Physical Therapy

## 2015-07-01 DIAGNOSIS — M545 Low back pain: Secondary | ICD-10-CM | POA: Insufficient documentation

## 2015-07-01 DIAGNOSIS — R29898 Other symptoms and signs involving the musculoskeletal system: Secondary | ICD-10-CM | POA: Insufficient documentation

## 2015-07-01 DIAGNOSIS — M79604 Pain in right leg: Secondary | ICD-10-CM

## 2015-07-01 NOTE — Patient Instructions (Signed)
High Step - Seated   Sit with back straight, feet flat, pointed out slightly. Shift weight onto one side: buttock and foot. Lift knee with green tband  Hold _2__ seconds. Repeat _10__ times each side.  Copyright  VHI. All rights reserved.  Knee Flexion: Sitting (Single Leg)   Sit facing anchor, leg extended. Tubing looped around ankle, flex knee, pulling back. Repeat _10_ times per set. Repeat with other leg. Do 2__ sets per session. Do 5__ sessions per week. Anchor Height: Knee  [email or web address].[email or web address]   Copyright  VHI. All rights reserved.  Back Hyperextension: Using Arms   Lying face down with arms bent, inhale. Then while exhaling, straighten arms. Hold __3__ seconds. Slowly return to starting position. Repeat __10__ times per set. Do __2__ sets per session. Do _2___ sessions per day.  Copyright  VHI. All rights reserved.

## 2015-07-01 NOTE — Therapy (Signed)
[place] MAIN [facility] SERVICES [address] [place], [place], [address] Phone: [phone]   Fax:  [phone]  Physical Therapy Evaluation  Patient Details  Name: Garrett Woods MRN: [id number] Date of Birth: [birth year] Referring Provider:  Birdie Sons, MD  Encounter Date: 07/01/2015      PT End of Session - 07/01/15 0915    Visit Number 1   Number of Visits 17   Date for PT Re-Evaluation 08/26/15   PT Start Time 0807   PT Stop Time 0911   PT Time Calculation (min) 64 min   Activity Tolerance Patient tolerated treatment well   Behavior During Therapy [facility] for tasks assessed/performed      Past Medical History  Diagnosis Date  . Arthritis   . Elevated PSA   . Prostate cancer   . Microscopic hematuria   . Gout   . Heart murmur   . Hypertension     Essential  . Hyperlipemia   . Pacemaker   . History of chicken pox     Past Surgical History  Procedure Laterality Date  . Knee surgery Right 1988    Tear of LCL of knee  . Melanoma excision  2013,2015  . Basal cell carcinoma excision  12/28/2012    Anterior neck, done by Dr. Lacinda Axon at [facility]  . Carpal tunnel release Left     There were no vitals filed for this visit.  Visit Diagnosis:  Lumbar pain with radiation down right leg - Plan: PT plan of care cert/re-cert  Weakness of right leg - Plan: PT plan of care cert/re-cert      Subjective Assessment - 07/01/15 0815    Subjective Patient reports that the doctor states that he has sciatica. The pain is mostly in the R side and goes down R LE with numbness and occasional pain. He states that the pain sometimes shifts to the L side. Patient reports that he has had this pain off and on for 10 years. He states that the pain has increased substantially over the past 8 months. Pain gets worse throughout the day, but he also has significant pain upon waking in the morning after sleeping on his stomach with R LE flexed.     Pertinent History Prostate cancer; just finished recent radiation treatment that ended in July. Melanoma surgey on the back in January 2016, [place] maker placement in March 2016.     Limitations Walking;Standing;Lifting;House hold activities   How long can you sit comfortably? Sitting does not bother patient most days    How long can you stand comfortably? not very long, just a few minutes    How long can you walk comfortably? Not walking long distances, approximately 1000 ft    Diagnostic tests xray in may 2015 - showed disc changes L3-S1    Patient Stated Goals Be able to walk without pain for longer distance, play golf again.    Currently in Pain? No/denies            [facility] PT Assessment - 07/02/15 0001    Assessment   Medical Diagnosis low back pain with RLE sciatica   Onset Date/Surgical Date 11/01/14   Hand Dominance Right   Next MD Visit None scheduled at the moment    Prior Therapy Patient reports that he had PT to reduce back pain in 2009, good results from strengthening and stretching. No pain upon completion of PT at that time.    Precautions  Precautions None   Restrictions   Weight Bearing Restrictions No   Home Environment   Living Environment Private residence   Living Arrangements Spouse/significant other   Available Help at Discharge Family   Type of [place] to enter   Entrance Stairs-Number of Steps 10  alternate enterance, 2 steps, no rail    Entrance Stairs-Rails Right;Left   Home Layout Two level   Alternate Level Stairs-Number of Steps 12   Alternate Level Stairs-Rails Left   Home Equipment None   Prior Function   Vocation Full time employment   Vocation Requirements Walking, visiting clients,   [employer] of [employer]; spends most of the day sitting   Leisure Golf, doing work around [employer]   Overall Cognitive Status Within Functional Limits for tasks assessed   Observation/Other Assessments   Modified Osw[place] 44%  impaired ( Severe disability)   Sensation   Light Touch Appears Intact   Additional Comments Patient reports slight difference in the R foot compared to the L. but can feel light touch.    Single Leg Stance   Comments Patient able to stand for up to 5 seconds on the L LE and 3 seconds on the R LE.    Posture/Postural Control   Posture Comments Patient sits and stands with forward head with rounded posture. Exhibits reduced lumbar lordosis   AROM   Overall AROM Comments All UE movements are grossly WNL. Bilateral LE A/ROM grossly within normal limits.    Strength   Right Hip Flexion 4/5   Right Hip Extension 4/5   Right Hip ABduction 4+/5   Right Hip ADduction 5/5   Left Hip Flexion 4+/5   Left Hip Extension 4+/5   Left Hip ABduction 4+/5   Left Hip ADduction 4+/5   Right Knee Flexion 4+/5   Right Knee Extension 5/5   Left Knee Flexion 5/5   Left Knee Extension 5/5   Right Ankle Dorsiflexion 4/5   Right Ankle Plantar Flexion 4-/5   Left Ankle Dorsiflexion 5/5   Left Ankle Plantar Flexion 4+/5   Palpation   Palpation comment Patient tender to palpation on the R SI joint and the R lateral side of sacrum. No tenderness noted in the R piriformis or increase in pain/numbness with PA or rotational mobilizations of the lumbar spine. Patient was noted to have slight hypomobilty in the lumbar spine with PA and rotational mobilizations.    FABER test   findings Negative   Side Right   Slump test   Findings Negative   Side Right   Straight Leg Raise   Findings Negative   Side  Right   other   Findings Positive   Side  Right   Comments Repeated extension. in standing and prone. Patient demonstrates decreased numbness in the R LE/foot.    Ober's Test   Findings Negative   Side Right;Left   Ely's Test   Findings Positive   Side Right;Left   Comments R knee ROM in prone 110degree. L knee ROM in prone 125degrees    SI Compression   Findings Negative   Side Right   Piriformis Test    Findings Negative   Side  Right   Ambulation/Gait   Gait Comments Patient ambulated with reciprocal gait pattern, wide BOS, and decreased step height. Patient also demonstrates slight forward head and rounded shoulders.    Standardized Balance Assessment   Five times sit to stand comments  13.30 seconds without HHA (>61 yo, <15 sec indicates low fall risk)   Berg Balance Test   Sit to Stand Able to stand without using hands and stabilize independently   Standing Unsupported Able to stand safely 2 minutes   Sitting with Back Unsupported but Feet Supported on Floor or Stool Able to sit safely and securely 2 minutes   Stand to Sit Sits safely with minimal use of hands   Transfers Able to transfer safely, minor use of hands   Standing Unsupported with Eyes Closed Able to stand 10 seconds safely   Standing Ubsupported with Feet Together Able to place feet together independently and stand 1 minute safely   From Standing, Reach Forward with Outstretched Arm Can reach confidently >25 cm (10")   From Standing Position, Pick up Object from Floor Able to pick up shoe safely and easily   From Standing Position, Turn to Look Behind Over each Shoulder Looks behind from both sides and weight shifts well   Turn 360 Degrees Able to turn 360 degrees safely in 4 seconds or less   Standing Unsupported, Alternately Place Feet on Step/Stool Able to stand independently and safely and complete 8 steps in 20 seconds   Standing Unsupported, One Foot in Front Able to place foot tandem independently and hold 30 seconds   Standing on One Leg Able to lift leg independently and hold equal to or more than 3 seconds   Total Score 54            Treatment:  Patient instructed in HEP  Prone extension x10 Hip flexion with green tband x10 each LE  Knee flexion green tband x10 each LE   Patient required min-mod verbal and tactile instruction for proper exercise positioning and improve eccentric control of movement  to increase strengthening. Patient demonstrated good response to instruction.               PT Education - 07/01/15 [phone]    Education provided Yes   Education Details Plan of care. HEP initiated - see patient instructions    Person(s) Educated Patient   Methods Explanation;Demonstration;Tactile cues;Handout;Verbal cues   Comprehension Verbalized understanding;Returned demonstration;Verbal cues required;Tactile cues required             PT Long Term Goals - 07/01/15 1013    PT LONG TERM GOAL #1   Title Patient will be independent with HEP to increase function and reduce pain by 08/26/15    Time 8   Period Weeks   Status New   PT LONG TERM GOAL #2   Title Patient will be able to walk 1054ft without pain on the 6 minute walk test to indicate improve function by 08/26/15.    Time 8   Period Weeks   Status New   PT LONG TERM GOAL #3   Title Patient will increase R LE strength to 5/5 to allow greater ease of ascent and descent of a flight of stairs without pain to access 2nd story of home by 08/26/15   Time 8   Period Weeks   Status New   PT LONG TERM GOAL #4   Title Patient will score less than a 25% on the Modified Oswestry to indicate improved function with daily tasks by 08/26/15   Time 8   Period Weeks   Status New               Plan - 07/01/15 4196    Clinical Impression Statement Patient is a  pleasant 59 year old male that reports to PT with r sided low back pain with radicular symptoms into the R LE/foot. Sensation is grossly intact for light touch, but patient reports slight reduction in sensation in the R foot. Decreased strength was found in the R LE compared to the L LE. Tenderness was found in the R SI joint and the R side of the sacrum; no tenderness or increased pain with palpation/mobilizations to the Lumbar spine. Joint play in the lumbar spine is slightly hypomobile to PA and bilateral rotation. Patient denies increased pain or radicular symptoms  with SI provocation tests except slight increase in pain at the R SI region with end range hip flexion. Repeated flexion increased R lumbar pain and repeated extension reduced numbness in the R foot. Patient instructed in HEP for repeated extension and R LE strengthening. Moderate verbal instruction provided for proper positioning and increased control with eccentric movements. Based on impairments found at evaluation, this patient would benefit from skilled PT to increase R LE strength, decrease back pain, and reduce  radicular pain to allow patient to return to PLOF.    Pt will benefit from skilled therapeutic intervention in order to improve on the following deficits Difficulty walking;Decreased activity tolerance;Pain;Obesity;Postural dysfunction;Improper body mechanics;Decreased endurance;Hypomobility   Rehab Potential Good   Clinical Impairments Affecting Rehab Potential Positive: good results from prior PT. Negative: co-morbidities, chronic condition.    PT Frequency 2x / week   PT Duration 8 weeks   PT Treatment/Interventions ADLs/Self Care Home Management;Gait training;Stair training;Functional mobility training;Therapeutic activities;Therapeutic exercise;Patient/family education;Manual techniques;Compression bandaging;Passive range of motion;Dry needling;Energy conservation;Traction;Moist Heat;Cryotherapy   PT Next Visit Plan Back ROM, extension exercises.     PT Home Exercise Plan See patient instructions    Consulted and Agree with Plan of Care Patient         Problem List Patient Active Problem List   Diagnosis Date Noted  . History of basal cell cancer 06/11/2015  . Prostate cancer 06/11/2015  . Sciatica of right side 06/11/2015  . Myasthenia gravis 06/11/2015  . Benign essential HTN 02/09/2015  . Combined fat and carbohydrate induced hyperlipemia 12/30/2014  . Artificial cardiac pacemaker 12/30/2014  . Bradycardia 11/21/2014  . Breathlessness on exertion 11/21/2014  .  Acquired complete AV block 10/16/2014  . Carpal tunnel syndrome 05/08/2009  . Lumbago 01/09/2009  . 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 Hyperlipidemia 11/08/2006  . Obesity, morbid (more than 100 lbs over ideal weight or BMI > 40) 07/23/2006  . Myasthenia gravis 07/22/2003   Barrie Folk SPT 07/02/2015   9:31 AM  This entire session was performed under direct supervision and direction of a licensed therapist. I have personally read, edited and approve of the note as written.  Hopkins,Margaret, PT, DPT 07/02/2015, 9:31 AM  [place] MAIN [facility] SERVICES [address] [place], [place], [address] Phone: [phone]   Fax:  [phone]

## 2015-07-04 ENCOUNTER — Ambulatory Visit: Payer: 59 | Attending: Family Medicine

## 2015-07-04 ENCOUNTER — Encounter: Payer: Self-pay | Admitting: Physical Therapy

## 2015-07-04 DIAGNOSIS — R29898 Other symptoms and signs involving the musculoskeletal system: Secondary | ICD-10-CM | POA: Diagnosis present

## 2015-07-04 DIAGNOSIS — M79604 Pain in right leg: Secondary | ICD-10-CM

## 2015-07-04 DIAGNOSIS — M545 Low back pain: Secondary | ICD-10-CM | POA: Diagnosis present

## 2015-07-04 NOTE — Patient Instructions (Signed)
HIP: Flexion / KNEE: Extension, Straight Leg Raise   Raise leg, keeping knee straight. Perform slowly. _12__ reps per set, _2__ sets per day, __5_ days per week   Copyright  VHI. All rights reserved.  ABDUCTION: Sitting - Resistance Band (Active)   Sit with feet flat. Lift right leg slightly and, against yellow resistance band, draw it out to side. Complete _2__ sets of __10_ repetitions. Perform _2__ sessions per day.  Copyright  VHI. All rights reserved.

## 2015-07-04 NOTE — Therapy (Signed)
[place] MAIN [facility] SERVICES [address] [place], [place], [address] Phone: [phone]   Fax:  [phone]  Physical Therapy Treatment  Patient Details  Name: Garrett Woods MRN: [id number] Date of Birth: [birth year] Referring Provider:  Birdie Sons, MD  Encounter Date: 07/04/2015      PT End of Session - 07/04/15 0856    Visit Number 2   Number of Visits 17   Date for PT Re-Evaluation 08/26/15   PT Start Time 0800   PT Stop Time 0853   PT Time Calculation (min) 53 min   Activity Tolerance Patient tolerated treatment well   Behavior During Therapy [facility] for tasks assessed/performed      Past Medical History  Diagnosis Date  . Arthritis   . Elevated PSA   . Prostate cancer   . Microscopic hematuria   . Gout   . Heart murmur   . Hypertension     Essential  . Hyperlipemia   . Pacemaker   . History of chicken pox     Past Surgical History  Procedure Laterality Date  . Knee surgery Right 1988    Tear of LCL of knee  . Melanoma excision  2013,2015  . Basal cell carcinoma excision  12/28/2012    Anterior neck, done by Dr. Lacinda Axon at [facility]  . Carpal tunnel release Left     There were no vitals filed for this visit.  Visit Diagnosis:  Lumbar pain with radiation down right leg  Weakness of right leg      Subjective Assessment - 07/04/15 0806    Subjective Patient reports that he is "doing pretty good" upon arrival to PT. He reports that he is in a little pain in his low back 3/10, He also reports that he has been compliant with HEP and states that he has had slight reduction in numbness in the R foot with prone extension exercises    Pertinent History Prostate cancer; just finished recent radiation treatment that ended in July. Melanoma surgey on the back in January 2016, Stem maker placement in March 2016.     Limitations Walking;Standing;Lifting;House hold activities   How long can you sit comfortably? Sitting  does not bother patient most days    How long can you stand comfortably? not very long, just a few minutes    How long can you walk comfortably? Not walking long distances, approximately 1000 ft    Diagnostic tests xray in may 2015 - showed disc changes L3-S1    Patient Stated Goals Be able to walk without pain for longer distance, play golf again.    Currently in Pain? Yes   Pain Score 3    Pain Location Back   Pain Orientation Lower;Right   Pain Descriptors / Indicators Dull;Aching   Pain Type Chronic pain   Pain Radiating Towards R LE    Pain Onset Today   Pain Frequency Intermittent   Aggravating Factors  standing and walking    Pain Relieving Factors sitting    Effect of Pain on Daily Activities reduced ambulation             [facility] PT Assessment - 07/04/15 0811    6 Minute Walk- Baseline   6 Minute Walk- Baseline yes   BP (mmHg) 141/82 mmHg   HR (bpm) 105   02 Sat (%RA) 100 %   Modified Borg Scale for Dyspnea 0- Nothing at all   Perceived Rate of Exertion (Borg) 7-  Very, very light   6 Minute walk- Post Test   6 Minute Walk Post Test yes   BP (mmHg) (!) 141/96 mmHg   HR (bpm) 103   02 Sat (%RA) 100 %   Modified Borg Scale for Dyspnea 2- Mild shortness of breath   6 minute walk test results    Aerobic Endurance Distance Walked 800   Endurance additional comments At start of test back pain 1/10. Patient required 2 seated rest breaks due increased back pain up to 5/10. Increased antalgic gait pattern on the R noted with increased distance             Treatment  Nustep level 5, 3 minutes LE only (unbilled)   Patient instructed in 6 minute walk test see above for results   Lumbar Flexion: 30 degrees Lumbar extension: 4 degrees    Seated therex with blue tband   Hip flexion x12 each LE  Hip abduction x12 each LE knee extension x12 each LE Knee flexions x12 each LE   Supine  SLR 2x12 Bridges with slight UE support x10 Prone extension x10   For  seated, supine and prone therex, PT provided min verbal instruction for proper ROM, increased eccentric control to improve strengthening, and proper breathing and deep core activation for increased spinal stabilization. Patient responded well to verbal instruction for PT.   Manual therapy  PT performed PA grade 3 mobilizations to the lumbar and low thoracic spine x30 seconds at each level from T9-L5.  PT performed L rotational grade 3 mobilizations to L2-L5 x30 seconds at each level  PT performed R rotational mobilizations to L4-L5 x 30 seconds at each Level PT instructed patient in prone extension with over pressure at L5-L3 x 7 at each segment   Long axis distraction performed to the R LE in prone 2x30 seconds no change in pain .   Min verbal instruction provided by PT for proper ROM with Prone press ups with overpressure for optimal segmental movement.   Upon completion of PT treatment session, patient reports reduction in pain in the low back with walking. Minimal change noted in R LE foot numbness.                  PT Education - 07/04/15 0854    Education provided Yes   Education Details Standardized outcome measure, strengthening, Manual therapy HEP advanced.    Person(s) Educated Patient   Methods Explanation;Demonstration;Tactile cues;Verbal cues   Comprehension Verbalized understanding;Verbal cues required;Returned demonstration;Tactile cues required             PT Long Term Goals - 07/01/15 1013    PT LONG TERM GOAL #1   Title Patient will be independent with HEP to increase function and reduce pain by 08/26/15    Time 8   Period Weeks   Status New   PT LONG TERM GOAL #2   Title Patient will be able to walk 1065ft without pain on the 6 minute walk test to indicate improve function by 08/26/15.    Time 8   Period Weeks   Status New   PT LONG TERM GOAL #3   Title Patient will increase R LE strength to 5/5 to allow greater ease of ascent and descent of a  flight of stairs without pain to access 2nd story of home by 08/26/15   Time 8   Period Weeks   Status New   PT LONG TERM GOAL #4   Title Patient will score  less than a 25% on the Modified Oswestry to indicate improved function with daily tasks by 08/26/15   Time 8   Period Weeks   Status New               Plan - 07/04/15 0857    Clinical Impression Statement Patient instructed in LE strengthening, core stabilization and 6 minute walk test. PT required to provide verbal and tactile instruction for exercise set up and proper exercise form including increase ROM, improve eccentric control and proper deep core activation. Patient responded well to instruction. With the 6 minute walk test, the patient was noted to require 2 seated rest breaks due to low back pain and increased antalgic gait pattern. PT performed manual therapy to the lumbar and low thoracic spine with PA and R rotational mobilizations. Patient reports decreased pain in the low back following manual therapy. Continued skilled PT is recommended to improve LE strength, decrease pain, improve lumbar motion to allow patient to return to PLOF.   Pt will benefit from skilled therapeutic intervention in order to improve on the following deficits Difficulty walking;Decreased activity tolerance;Pain;Obesity;Postural dysfunction;Improper body mechanics;Decreased endurance;Hypomobility   Rehab Potential Good   Clinical Impairments Affecting Rehab Potential Positive: good results from prior PT. Negative: co-morbidities, chronic condition.    PT Frequency 2x / week   PT Duration 8 weeks   PT Treatment/Interventions ADLs/Self Care Home Management;Gait training;Stair training;Functional mobility training;Therapeutic activities;Therapeutic exercise;Patient/family education;Manual techniques;Compression bandaging;Passive range of motion;Dry needling;Energy conservation;Traction;Moist Heat;Cryotherapy   PT Next Visit Plan LE strengthening.  Lumbar extension   PT Home Exercise Plan advanced - See patient instructions    Consulted and Agree with Plan of Care Patient        Problem List Patient Active Problem List   Diagnosis Date Noted  . History of basal cell cancer 06/11/2015  . Prostate cancer 06/11/2015  . Sciatica of right side 06/11/2015  . Myasthenia gravis 06/11/2015  . Benign essential HTN 02/09/2015  . Combined fat and carbohydrate induced hyperlipemia 12/30/2014  . Artificial cardiac pacemaker 12/30/2014  . Bradycardia 11/21/2014  . Breathlessness on exertion 11/21/2014  . Acquired complete AV block 10/16/2014  . Carpal tunnel syndrome 05/08/2009  . Lumbago 01/09/2009  . 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 Hyperlipidemia 11/08/2006  . Obesity, morbid (more than 100 lbs over ideal weight or BMI > 40) 07/23/2006  . Myasthenia gravis 07/22/2003   Barrie Folk SPT 07/04/2015   12:00 PM   This entire session was performed under direct supervision and direction of a licensed therapist/therapist assistant . I have personally read, edited and approve of the note as written. t Phillips Grout PT, DPT   Huprich,Jason 07/04/2015, 12:00 PM  [place] MAIN [facility] SERVICES [address] [place], [place], [address] Phone: [phone]   Fax:  [phone]

## 2015-07-08 ENCOUNTER — Ambulatory Visit: Payer: 59

## 2015-07-08 DIAGNOSIS — M545 Low back pain: Secondary | ICD-10-CM

## 2015-07-08 DIAGNOSIS — M79604 Pain in right leg: Secondary | ICD-10-CM

## 2015-07-08 DIAGNOSIS — R29898 Other symptoms and signs involving the musculoskeletal system: Secondary | ICD-10-CM

## 2015-07-08 NOTE — Therapy (Signed)
[place] MAIN [facility] SERVICES [address] [place], [place], [address] Phone: [phone]   Fax:  [phone]  Physical Therapy Treatment  Patient Details  Name: Garrett Woods MRN: [id number] Date of Birth: 1955/11/25 Referring Provider:  Birdie Sons, MD  Encounter Date: 07/08/2015      PT End of Session - 07/08/15 0814    Visit Number 3   Number of Visits 17   Date for PT Re-Evaluation 08/26/15   PT Start Time 0803   PT Stop Time 0845   PT Time Calculation (min) 42 min   Activity Tolerance Patient tolerated treatment well   Behavior During Therapy [facility] for tasks assessed/performed      Past Medical History  Diagnosis Date  . Arthritis   . Elevated PSA   . Prostate cancer   . Microscopic hematuria   . Gout   . Heart murmur   . Hypertension     Essential  . Hyperlipemia   . Pacemaker   . History of chicken pox     Past Surgical History  Procedure Laterality Date  . Knee surgery Right 1988    Tear of LCL of knee  . Melanoma excision  2013,2015  . Basal cell carcinoma excision  12/28/2012    Anterior neck, done by Dr. Lacinda Axon at [facility]  . Carpal tunnel release Left     There were no vitals filed for this visit.  Visit Diagnosis:  Lumbar pain with radiation down right leg  Weakness of right leg      Subjective Assessment - 07/08/15 0808    Subjective Patient states that he is doing okay this AM, but states that he feels a little stiff. He states that he has moderate pain in the low back 5/10 upon arrival to PT and still has numbness in the R foot.    Pertinent History Prostate cancer; just finished recent radiation treatment that ended in July. Melanoma surgey on the back in January 2016, [place] maker placement in March 2016.     Limitations Walking;Standing;Lifting;House hold activities   How long can you sit comfortably? Sitting does not bother patient most days    How long can you stand comfortably? not very  long, just a few minutes    How long can you walk comfortably? Not walking long distances, approximately 1000 ft    Diagnostic tests xray in may 2015 - showed disc changes L3-S1    Patient Stated Goals Be able to walk without pain for longer distance, play golf again.    Currently in Pain? Yes   Pain Score 5    Pain Location Back   Pain Orientation Lower;Right   Pain Descriptors / Indicators Aching;Dull   Pain Type Chronic pain   Pain Onset More than a month ago   Pain Frequency Intermittent   Aggravating Factors  standing and walking    Pain Relieving Factors sitting       Treatment  Nustep 4 minutes Level 4 (unbilled )   SLR 2x10 BLE  Bridges 2x 10 BLE  Supine trunk rotations L and R x1 minutes  sidelying hip abduction 2x10 BLE Prone hip extension 2x 10 BLE  Muscle energy  Restied hip flexion on the L LE 2x10  Resisted hip extension on the R LE 2x10   For all therex, PT provided Min Verbal instruction for proper exercise technique including decreased speed of eccentric movements, to maintain movements in a pain free ROM, and to  increased deep core activation with proper diaphragmatic breathing.     Manual therapy  PT performed PA grade 3 mobilizations to the lumbar and low thoracic spine x30 seconds at each level from T9-L5.  PT performed L rotational grade 3 mobilizations to L2-L5 x30 seconds at each level  PT performed R rotational mobilizations to L3-L5 x 30 seconds at each Level PT instructed patient in prone extension with over pressure at L5-L2 x 5 at each segment   Long axis distraction performed to the R LE in prone 2x30 seconds no change in pain.   Min verbal instruction provided by PT for proper ROM with Prone press ups with overpressure for optimal segmental movement.   Patient reports decreased pain in the low back to 3/10 with gait following PT treatment.                       PT Education - 07/08/15 0813    Education provided Yes    Education Details LE strengthening. Manual therapy.    Person(s) Educated Patient   Methods Explanation;Demonstration;Verbal cues   Comprehension Verbalized understanding;Returned demonstration;Verbal cues required             PT Long Term Goals - 07/01/15 1013    PT LONG TERM GOAL #1   Title Patient will be independent with HEP to increase function and reduce pain by 08/26/15    Time 8   Period Weeks   Status New   PT LONG TERM GOAL #2   Title Patient will be able to walk 1055ft without pain on the 6 minute walk test to indicate improve function by 08/26/15.    Time 8   Period Weeks   Status New   PT LONG TERM GOAL #3   Title Patient will increase R LE strength to 5/5 to allow greater ease of ascent and descent of a flight of stairs without pain to access 2nd story of home by 08/26/15   Time 8   Period Weeks   Status New   PT LONG TERM GOAL #4   Title Patient will score less than a 25% on the Modified Oswestry to indicate improved function with daily tasks by 08/26/15   Time 8   Period Weeks   Status New               Plan - 07/08/15 0854    Clinical Impression Statement Patient instructed in LE strengthening and core stabilization exercises. PT provided Min verbal instruction for proper deep core activation and proper ROM and eccentric control. Patient responded well to verbal instruction. PT performed manual therapy including grade III mobilization and passive stretching to increase lumbar extension and increase anterior R hip flexibility. Patient reports decreased pain in the R lumbar region following manual therapy. Continued skilled PT is recommended to decreased pain, improve function, and increase strength to return patient to PLOF.    Pt will benefit from skilled therapeutic intervention in order to improve on the following deficits Difficulty walking;Decreased activity tolerance;Pain;Obesity;Postural dysfunction;Improper body mechanics;Decreased  endurance;Hypomobility   Rehab Potential Good   Clinical Impairments Affecting Rehab Potential Positive: good results from prior PT. Negative: co-morbidities, chronic condition.    PT Frequency 2x / week   PT Duration 8 weeks   PT Treatment/Interventions ADLs/Self Care Home Management;Gait training;Stair training;Functional mobility training;Therapeutic activities;Therapeutic exercise;Patient/family education;Manual techniques;Compression bandaging;Passive range of motion;Dry needling;Energy conservation;Traction;Moist Heat;Cryotherapy   PT Next Visit Plan LE strengthening. Lumbar extension   PT Home Exercise  Plan continue as previously given   Consulted and Agree with Plan of Care Patient        Problem List Patient Active Problem List   Diagnosis Date Noted  . History of basal cell cancer 06/11/2015  . Prostate cancer 06/11/2015  . Sciatica of right side 06/11/2015  . Myasthenia gravis 06/11/2015  . Benign essential HTN 02/09/2015  . Combined fat and carbohydrate induced hyperlipemia 12/30/2014  . Artificial cardiac pacemaker 12/30/2014  . Bradycardia 11/21/2014  . Breathlessness on exertion 11/21/2014  . Acquired complete AV block 10/16/2014  . Carpal tunnel syndrome 05/08/2009  . Lumbago 01/09/2009  . 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 Hyperlipidemia 11/08/2006  . Obesity, morbid (more than 100 lbs over ideal weight or BMI > 40) 07/23/2006  . Myasthenia gravis 07/22/2003   Barrie Folk SPT 07/08/2015   2:20 PM   Phillips Grout PT, DPT   Huprich,Jason 07/08/2015, 2:20 PM This entire session was performed under direct supervision and direction of a licensed therapist/therapist assistant . I have personally read, edited and approve of the note as written.   [place] MAIN [facility] SERVICES [address] [place], [place], [address] Phone: [phone]   Fax:  [phone]

## 2015-07-09 ENCOUNTER — Other Ambulatory Visit: Payer: Self-pay | Admitting: *Deleted

## 2015-07-09 ENCOUNTER — Encounter: Payer: Self-pay | Admitting: Radiation Oncology

## 2015-07-09 ENCOUNTER — Ambulatory Visit
Admission: RE | Admit: 2015-07-09 | Discharge: 2015-07-09 | Disposition: A | Discharge status: Home / Self Care | Payer: 59 | Source: Ambulatory Visit | Attending: Radiation Oncology | Admitting: Radiation Oncology

## 2015-07-09 VITALS — BP 152/96 | HR 96 | Temp 98.1°F | Resp 18 | Wt 359.4 lb

## 2015-07-09 DIAGNOSIS — C61 Malignant neoplasm of prostate: Secondary | ICD-10-CM

## 2015-07-09 NOTE — Progress Notes (Signed)
Radiation Oncology Follow up Note  Name: Garrett Woods   Date:   07/09/2015 MRN:  [id number] DOB: April 28, 1956    This 59 y.o. male presents to the clinic today for follow-up for prostate cancer.  REFERRING PROVIDER: Birdie Sons, MD  HPI: Patient is a 59 year old male now out 1 month having completedIMRT image guided radiation therapy for a Gleason 6 (3+3) adenocarcinoma presenting the PSA of 5. He is seen today in routine follow-up and is doing well he states his urinary frequency and urgency has subsided considerably and is almost back to normal. No diarrhea at this time..  COMPLICATIONS OF TREATMENT: none  FOLLOW UP COMPLIANCE: keeps appointments   PHYSICAL EXAM:  BP 152/96 mmHg  Pulse 96  Temp(Src) 98.1 F (36.7 C)  Resp 18  Wt 359 lb 5.6 oz (163 kg) Prostate is difficult to palpate based on the patient's morbid obesity. No other rectal abnormality is identified. Well-developed well-nourished patient in NAD. HEENT reveals PERLA, EOMI, discs not visualized.  Oral cavity is clear. No oral mucosal lesions are identified. Neck is clear without evidence of cervical or supraclavicular adenopathy. Lungs are clear to A&P. Cardiac examination is essentially unremarkable with regular rate and rhythm without murmur rub or thrill. Abdomen is benign with no organomegaly or masses noted. Motor sensory and DTR levels are equal and symmetric in the upper and lower extremities. Cranial nerves II through XII are grossly intact. Proprioception is intact. No peripheral adenopathy or edema is identified. No motor or sensory levels are noted. Crude visual fields are within normal range.   RADIOLOGY RESULTS: No films for review  PLAN: At the present time he is doing well recovering nicely from side effects from I MRT radiation therapy for prostate cancer. I've again explained to him our protocol for following PSA and I've asked to see him back in 4 months at which time PSA will be obtained if  not sooner. Patient knows to call sooner with any concerns. I am please was overall progress.  I would like to take this opportunity for allowing me to participate in the care of your patient.Armstead Peaks., MD

## 2015-07-10 ENCOUNTER — Encounter: Payer: Self-pay | Admitting: Physical Therapy

## 2015-07-10 ENCOUNTER — Ambulatory Visit: Payer: 59 | Admitting: Physical Therapy

## 2015-07-10 DIAGNOSIS — R29898 Other symptoms and signs involving the musculoskeletal system: Secondary | ICD-10-CM

## 2015-07-10 DIAGNOSIS — M545 Low back pain: Secondary | ICD-10-CM | POA: Diagnosis not present

## 2015-07-10 DIAGNOSIS — M79604 Pain in right leg: Secondary | ICD-10-CM

## 2015-07-10 NOTE — Patient Instructions (Signed)
Standing Arch (Extension)   Place hands in small of back. Using hands as fulcrum, arch backward. Try to keep knees straight. Great exercise if sitting makes pain worse. Use to break up long periods of sitting. Repeat _10___ times. Do _2-3___ sessions per day.  http://gt2.exer.us/247   Copyright  VHI. All rights reserved.  HIP: Short Hip Flexors - Standing   Place hand on wall for support. Place one leg in front of other. Bend both knees. Lift chest and rotate pelvis forward. Hold ___ seconds. _2__ reps per set for 20 seconds, _2__ sets per day, _5__ days per week  Copyright  VHI. All rights reserved.

## 2015-07-10 NOTE — Therapy (Signed)
[place] MAIN [facility] SERVICES [address] [place], [place], [address] Phone: [phone]   Fax:  [phone]  Physical Therapy Treatment  Patient Details  Name: Garrett Woods MRN: [id number] Date of Birth: 1956-03-06 Referring Provider:  Birdie Sons, MD  Encounter Date: 07/10/2015      PT End of Session - 07/10/15 0859    Visit Number 4   Number of Visits 17   Date for PT Re-Evaluation 08/26/15   PT Start Time 0807   PT Stop Time 0855   PT Time Calculation (min) 48 min   Activity Tolerance Patient tolerated treatment well   Behavior During Therapy [facility] for tasks assessed/performed      Past Medical History  Diagnosis Date  . Arthritis   . Elevated PSA   . Prostate cancer   . Microscopic hematuria   . Gout   . Heart murmur   . Hypertension     Essential  . Hyperlipemia   . Pacemaker   . History of chicken pox     Past Surgical History  Procedure Laterality Date  . Knee surgery Right 1988    Tear of LCL of knee  . Melanoma excision  2013,2015  . Basal cell carcinoma excision  12/28/2012    Anterior neck, done by Dr. Lacinda Axon at [facility]  . Carpal tunnel release Left     There were no vitals filed for this visit.  Visit Diagnosis:  Lumbar pain with radiation down right leg  Weakness of right leg      Subjective Assessment - 07/10/15 0811    Subjective Patient reports that he is doing "okay" upon arrival to PT. He states that the is in a little pain upon arrival to PT 4/10. He also states that he has been compliant with HEP.    Pertinent History Prostate cancer; just finished recent radiation treatment that ended in July. Melanoma surgey on the back in January 2016, [place] maker placement in March 2016.     Limitations Walking;Standing;Lifting;House hold activities   How long can you sit comfortably? Sitting does not bother patient most days    How long can you stand comfortably? not very long, just a few minutes     How long can you walk comfortably? Not walking long distances, approximately 1000 ft    Diagnostic tests xray in may 2015 - showed disc changes L3-S1    Patient Stated Goals Be able to walk without pain for longer distance, play golf again.    Currently in Pain? Yes   Pain Score 4    Pain Location Back   Pain Orientation Lower;Right   Pain Descriptors / Indicators Aching;Dull   Pain Type Chronic pain   Pain Radiating Towards R LE   Pain Onset More than a month ago   Pain Frequency Intermittent   Aggravating Factors  standing and walking   Pain Relieving Factors sitting                Treatment  Nustep 4 minutes Level 3 (unbilled )   Standing therex with green tband:  Hip abduction x12 BLE Hip extension x12 BLE  Hip flexion x12 BLE   Step ups to 5 inch step x12 each LE   For standing therex, PT provided min verbal instruction for increased eccentric control and decreased compensation from the trunk. Patient noted to have slight difficulty with eccentric control on the R LE with step down.   Supine  SLR 2x12 BLE  Bridges 2x12 BLE  Supine trunk rotations L and R x1 minutes  sidelying hip abduction 2x10 BLE Prone hip extension 2x 10 BLE  Muscle energy  Restied hip flexion for 5 second hold on the L LE x10  Resisted hip extension for 5 second hold on the R LE 2x10   For all therex, PT provided Min Verbal instruction for proper exercise technique including decreased speed of eccentric movements, to maintain movements in a pain free ROM, and to increased deep core activation with proper diaphragmatic breathing.   HEP advanced  hip flexion stretch 2 x20 seconds  Standing extension x10   Min verbal instruction for increased ROM, proper exercise set up, and decreased lateral trunk flexion. Good response to instruction.   Manual therapy  PT performed PA grade III mobilizations to the lumbar and low thoracic spine x30 seconds at each level from T9-L5.  PT  performed L rotational grade III mobilizations to L2-L5 x30 seconds at each level  PT performed R rotational mobilizations to L3-L5 x 30 seconds at each Level PT instructed patient in prone extension with over pressure at L5-L2 x 5 at each segment   Min verbal instruction provided by PT for proper ROM with Prone press ups with overpressure for increased segmental movement.   Patient reports decreased pain in the low back to 2/10 with gait following PT treatment.                   PT Education - 07/10/15 0813    Education provided Yes   Education Details LE strengthening. Manual  therapy    Person(s) Educated Patient   Methods Explanation;Demonstration;Verbal cues   Comprehension Returned demonstration;Verbal cues required;Verbalized understanding             PT Long Term Goals - 07/01/15 1013    PT LONG TERM GOAL #1   Title Patient will be independent with HEP to increase function and reduce pain by 08/26/15    Time 8   Period Weeks   Status New   PT LONG TERM GOAL #2   Title Patient will be able to walk 1033ft without pain on the 6 minute walk test to indicate improve function by 08/26/15.    Time 8   Period Weeks   Status New   PT LONG TERM GOAL #3   Title Patient will increase R LE strength to 5/5 to allow greater ease of ascent and descent of a flight of stairs without pain to access 2nd story of home by 08/26/15   Time 8   Period Weeks   Status New   PT LONG TERM GOAL #4   Title Patient will score less than a 25% on the Modified Oswestry to indicate improved function with daily tasks by 08/26/15   Time 8   Period Weeks   Status New               Plan - 07/10/15 0859    Clinical Impression Statement Patient instructed in LE strengthening as well as core stabilization on this day. PT provided min verbal instruction with all therex including decreased compensation from the trunk, increase eccentric control and proper ROM to increase  strengthening. Patient responded well to verbal instruction. HEP advanced to include standing back extension exercises as well as R side hip flexion stretch. Manual therapy performed with Grade III PA and R and L unilateral mobilizations to the lower thoracic spine and lumbar spine. Patient reports decreased  pain in the low back secondary to manual therapy. Continued skilled PT is recommended to decrease pain, and improve strength to allow patient to return to PLOF.   Pt will benefit from skilled therapeutic intervention in order to improve on the following deficits Difficulty walking;Decreased activity tolerance;Pain;Obesity;Postural dysfunction;Improper body mechanics;Decreased endurance;Hypomobility   Rehab Potential Good   Clinical Impairments Affecting Rehab Potential Positive: good results from prior PT. Negative: co-morbidities, chronic condition.    PT Frequency 2x / week   PT Duration 8 weeks   PT Treatment/Interventions ADLs/Self Care Home Management;Gait training;Stair training;Functional mobility training;Therapeutic activities;Therapeutic exercise;Patient/family education;Manual techniques;Compression bandaging;Passive range of motion;Dry needling;Energy conservation;Traction;Moist Heat;Cryotherapy   PT Next Visit Plan LE strengthening. Lumbar extension   PT Home Exercise Plan Advanced - see patient instructions.    Consulted and Agree with Plan of Care Patient        Problem List Patient Active Problem List   Diagnosis Date Noted  . History of basal cell cancer 06/11/2015  . Prostate cancer 06/11/2015  . Sciatica of right side 06/11/2015  . Myasthenia gravis 06/11/2015  . Benign essential HTN 02/09/2015  . Combined fat and carbohydrate induced hyperlipemia 12/30/2014  . Artificial cardiac pacemaker 12/30/2014  . Bradycardia 11/21/2014  . Breathlessness on exertion 11/21/2014  . Acquired complete AV block 10/16/2014  . Carpal tunnel syndrome 05/08/2009  . Lumbago 01/09/2009   . 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 Hyperlipidemia 11/08/2006  . Obesity, morbid (more than 100 lbs over ideal weight or BMI > 40) 07/23/2006  . Myasthenia gravis 07/22/2003  Barrie Folk, SPT This entire session was performed under direct supervision and direction of a licensed therapist . I have personally read, edited and approve of the note as written.   Hopkins,Margaret PT, DPT 07/10/2015, 11:00 AM  [place] MAIN [facility] SERVICES [address] [place], [place], [address] Phone: [phone]   Fax:  [phone]

## 2015-07-15 ENCOUNTER — Encounter: Payer: 59 | Admitting: Physical Therapy

## 2015-07-17 ENCOUNTER — Encounter: Payer: Self-pay | Admitting: Physical Therapy

## 2015-07-17 ENCOUNTER — Ambulatory Visit: Payer: 59 | Admitting: Physical Therapy

## 2015-07-17 DIAGNOSIS — M545 Low back pain, unspecified: Secondary | ICD-10-CM

## 2015-07-17 DIAGNOSIS — R29898 Other symptoms and signs involving the musculoskeletal system: Secondary | ICD-10-CM

## 2015-07-17 NOTE — Therapy (Addendum)
[place] MAIN [facility] SERVICES [address] [place], [place], [address] Phone: [phone]   Fax:  [phone]  Physical Therapy Treatment  Patient Details  Name: Garrett Woods MRN: [id number] Date of Birth: 01/01/1956 Referring Provider:  Birdie Sons, MD  Encounter Date: 07/17/2015      PT End of Session - 07/17/15 0848    Visit Number 5   Number of Visits 17   Date for PT Re-Evaluation 08/26/15   PT Start Time 0803   PT Stop Time 0846   PT Time Calculation (min) 43 min   Activity Tolerance Patient tolerated treatment well   Behavior During Therapy [facility] for tasks assessed/performed      Past Medical History  Diagnosis Date  . Arthritis   . Elevated PSA   . Prostate cancer   . Microscopic hematuria   . Gout   . Heart murmur   . Hypertension     Essential  . Hyperlipemia   . Pacemaker   . History of chicken pox     Past Surgical History  Procedure Laterality Date  . Knee surgery Right 1988    Tear of LCL of knee  . Melanoma excision  2013,2015  . Basal cell carcinoma excision  12/28/2012    Anterior neck, done by Dr. Lacinda Axon at [facility]  . Carpal tunnel release Left     There were no vitals filed for this visit.  Visit Diagnosis:  Lumbar pain with radiation down right leg  Weakness of right leg      Subjective Assessment - 07/17/15 0805    Subjective Patient reports that he is doing well upon arrival to PT. He states that his back feels a little stiff this AM 5/10    Pertinent History Prostate cancer; just finished recent radiation treatment that ended in July. Melanoma surgey on the back in January 2016, [place] maker placement in March 2016.     Limitations Walking;Standing;Lifting;House hold activities   How long can you sit comfortably? Sitting does not bother patient most days    How long can you stand comfortably? not very long, just a few minutes    How long can you walk comfortably? Not walking long  distances, approximately 1000 ft    Diagnostic tests xray in may 2015 - showed disc changes L3-S1    Patient Stated Goals Be able to walk without pain for longer distance, play golf again.    Currently in Pain? Yes   Pain Score 5    Pain Location Back   Pain Orientation Right;Left   Pain Descriptors / Indicators Aching;Dull   Pain Type Chronic pain   Pain Onset More than a month ago   Pain Frequency Intermittent   Aggravating Factors  standing and walking    Pain Relieving Factors sitting          Treatment  Nustep 4 minutes Level 3 (unbilled )    Standing lumbar extension 2x10   Standing therex with green tband:  Hip abduction x12 BLE Hip extension x12 BLE  Hip flexion x12 BLE   PT provided min verbal instruction to decrease compensation from trunk as well as increase eccentric control with the R LE as well as maintain movement in pain free range. Patient responded well to instruction but denotes slight increase in lumbar pain without centralization of symptoms from the foot.     Supine  Lumbar rotations with LE on small therapy ball 2x1 minute   Pelvic  rocks 2x12  Bridges 2x12 BLE  Prone hip extension 2x 12 BLE  Qped shoulder flexion x10 each UE  Attempted qped hip extension on therapy ball. Patient unable to maintain control to perform hip extension.   Muscle energy  Restied hip flexion for 5 second hold on the L LE 2x10  Resisted hip extension for 5 second hold on the R LE 2x10    Min verbal instruction for increased ROM, proper exercise set up and improve activation of the deep core musculature. Good response to instruction.   Manual therapy  PT performed PA grade III mobilizations to the lumbar and low thoracic spine x30 seconds at each level from T9-L5.  PT performed L unilateral grade III mobilizations to T11-L5 x30 seconds at each level  PT performed R unilateral  grade III mobilizations to T11-L5 x 30 seconds at each Level PT instructed patient  in prone extension with over pressure at L5-L1 x 5 at each segment   Min verbal instruction provided by PT for proper ROM with Prone press ups with overpressure for increased segmental movement.   Patient reports decreased pain in the low back to 3/10 with gait following PT treatment.                         PT Education - 07/17/15 1726    Education provided Yes   Education Details LE strengthening, core stabilization, manual therapy    Person(s) Educated Patient   Methods Explanation;Demonstration;Verbal cues   Comprehension Verbalized understanding;Returned demonstration;Verbal cues required             PT Long Term Goals - 07/01/15 1013    PT LONG TERM GOAL #1   Title Patient will be independent with HEP to increase function and reduce pain by 08/26/15    Time 8   Period Weeks   Status New   PT LONG TERM GOAL #2   Title Patient will be able to walk 1065ft without pain on the 6 minute walk test to indicate improve function by 08/26/15.    Time 8   Period Weeks   Status New   PT LONG TERM GOAL #3   Title Patient will increase R LE strength to 5/5 to allow greater ease of ascent and descent of a flight of stairs without pain to access 2nd story of home by 08/26/15   Time 8   Period Weeks   Status New   PT LONG TERM GOAL #4   Title Patient will score less than a 25% on the Modified Oswestry to indicate improved function with daily tasks by 08/26/15   Time 8   Period Weeks   Status New               Plan - 07/17/15 0849    Clinical Impression Statement Patient instructed in LE strengthening as well as increased lumbar stabilization. Patient required min verbal instruction for proper exercise technique including decreased compensation from trunk and improved hold time to increase stabilization exercises. Patient responded well to instruction with decreased compensation. Patient reports slight increase in low back pain with standing repeated  movements on this day. Patient noted to have grossly improved ROM with prone hip extension as well as decreased trunk activation to achieve movement. PT performed manual therapy with Grade III PA and R and L Unilateral mobilizations to the low thoracic and lumbar spine; improved joint mobility noted compared to previous treatment in the lumbar spine. Slight decrease  in pain noted following manual therapy form 5/10 to 3/10. Continued skilled PT is recommended to decrease back pain and improve walking ability to allow patient to return to PLOF.   Pt will benefit from skilled therapeutic intervention in order to improve on the following deficits Difficulty walking;Decreased activity tolerance;Pain;Obesity;Postural dysfunction;Improper body mechanics;Decreased endurance;Hypomobility   Rehab Potential Good   Clinical Impairments Affecting Rehab Potential Positive: good results from prior PT. Negative: co-morbidities, chronic condition.    PT Frequency 2x / week   PT Duration 8 weeks   PT Treatment/Interventions ADLs/Self Care Home Management;Gait training;Stair training;Functional mobility training;Therapeutic activities;Therapeutic exercise;Patient/family education;Manual techniques;Compression bandaging;Passive range of motion;Dry needling;Energy conservation;Traction;Moist Heat;Cryotherapy   PT Next Visit Plan LE strengthening. Lumbar extension   PT Home Exercise Plan continue as given    Consulted and Agree with Plan of Care Patient        Problem List Patient Active Problem List   Diagnosis Date Noted  . History of basal cell cancer 06/11/2015  . Prostate cancer 06/11/2015  . Sciatica of right side 06/11/2015  . Myasthenia gravis 06/11/2015  . Benign essential HTN 02/09/2015  . Combined fat and carbohydrate induced hyperlipemia 12/30/2014  . Artificial cardiac pacemaker 12/30/2014  . Bradycardia 11/21/2014  . Breathlessness on exertion 11/21/2014  . Acquired complete AV block 10/16/2014   . Carpal tunnel syndrome 05/08/2009  . Lumbago 01/09/2009  . 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 Hyperlipidemia 11/08/2006  . Obesity, morbid (more than 100 lbs over ideal weight or BMI > 40) 07/23/2006  . Myasthenia gravis 07/22/2003   Barrie Folk SPT 07/18/2015   11:52 AM   Royce Macadamia 07/18/2015, 11:52 AM   This entire session was performed under direct supervision and direction of a licensed therapist/therapist assistant . I have personally read, edited and approve of the note as written.  Kerman Passey, PT, [place] MAIN [facility] SERVICES [address] [place], [place], [address] Phone: [phone]   Fax:  [phone]

## 2015-07-21 ENCOUNTER — Encounter: Payer: Self-pay | Admitting: Physical Therapy

## 2015-07-21 ENCOUNTER — Ambulatory Visit: Payer: 59 | Admitting: Physical Therapy

## 2015-07-21 DIAGNOSIS — M545 Low back pain: Secondary | ICD-10-CM

## 2015-07-21 DIAGNOSIS — R29898 Other symptoms and signs involving the musculoskeletal system: Secondary | ICD-10-CM

## 2015-07-21 DIAGNOSIS — M79604 Pain in right leg: Secondary | ICD-10-CM

## 2015-07-21 NOTE — Therapy (Signed)
[place] MAIN [facility] SERVICES [address] [place], [place], [address] Phone: [phone]   Fax:  [phone]  Physical Therapy Treatment  Patient Details  Name: Garrett Woods MRN: [id number] Date of Birth: 03/16/1956 Referring Provider:  Birdie Sons, MD  Encounter Date: 07/21/2015      PT End of Session - 07/21/15 0856    Visit Number 6   Number of Visits 17   Date for PT Re-Evaluation 08/26/15   PT Start Time 0800   PT Stop Time 0848   PT Time Calculation (min) 48 min   Activity Tolerance Patient tolerated treatment well   Behavior During Therapy [facility] for tasks assessed/performed      Past Medical History  Diagnosis Date  . Arthritis   . Elevated PSA   . Prostate cancer   . Microscopic hematuria   . Gout   . Heart murmur   . Hypertension     Essential  . Hyperlipemia   . Pacemaker   . History of chicken pox     Past Surgical History  Procedure Laterality Date  . Knee surgery Right 1988    Tear of LCL of knee  . Melanoma excision  2013,2015  . Basal cell carcinoma excision  12/28/2012    Anterior neck, done by Dr. Lacinda Axon at [facility]  . Carpal tunnel release Left     There were no vitals filed for this visit.  Visit Diagnosis:  Lumbar pain with radiation down right leg  Weakness of right leg      Subjective Assessment - 07/21/15 0803    Subjective Patient reports that he is doing "okay" upon arrival to PT. He states that he feels a litlle stiff this morning and is having some pain when he walks.    Pertinent History Prostate cancer; just finished recent radiation treatment that ended in July. Melanoma surgey on the back in January 2016, [place] maker placement in March 2016.     Limitations Walking;Standing;Lifting;House hold activities   How long can you sit comfortably? Sitting does not bother patient most days    How long can you stand comfortably? not very long, just a few minutes    How long can you  walk comfortably? Not walking long distances, approximately 1000 ft    Diagnostic tests xray in may 2015 - showed disc changes L3-S1    Patient Stated Goals Be able to walk without pain for longer distance, play golf again.    Currently in Pain? Yes   Pain Score 5    Pain Location Back   Pain Orientation Right   Pain Descriptors / Indicators Aching;Dull   Pain Type Chronic pain   Pain Onset More than a month ago   Pain Frequency Intermittent   Aggravating Factors  walking    Pain Relieving Factors siting         Treatment   Nustep 4 minutes Level 3 (unbilled )    Standing lumbar extension x12 Standing hip flexor stretch 2x20 seconds  Standing therex with green tband:   Hip abduction x12 BLE Hip extension x12 BLE    Patient required min verbal instruction from PT for increased lumbar lordosis to improve spinal alignment, as well as decreased compensation from the trunk for increased strengthening and decreased low back co-contraction.     Supine   Lumbar rotations with LE on small therapy ball 2x1 minute    Pelvic rocks 2x15   Prone hip extension 2x 12  BLE Prone trunk extension 2x12 sidelying hip abduction  2x12  sidelying archer stretch 3x15  seconds bilaterally  Seated on therapy ball :  Trunk rotations 2x15  Marches 2x12 BLE Resisted rotation to neutral with red tband x12 each direction   Min verbal instruction with seated, prone, supine, and sidelying exercises for increased ROM, improved weight shift and decreased trunk lean/compensation, as well as proper lumbar positioning to improve activation of the deep core musculature. Good response to instruction  Muscle energy   Resisted hip flexion for 5 second hold on the L LE x10   Resisted hip extension for 5 second hold on the R LE x10    Manual therapy   PT performed PA grade III mobilizations to the lumbar and low thoracic spine x30 seconds at each level from T9-L5.   PT performed R unilateral  grade III  mobilizations to T11-L5 x 30 seconds at each Level PT instructed patient in prone extension with over pressure at L2-L5 x 5 at each segment  PT performed STM to the R lumbar region to reduce myofascial restrictions x 3 minutes   Min verbal instruction provided by PT for proper ROM with Prone press ups with overpressure for increased segmental movement.   Patient reports decreased pain in the low back to 3/10 with gait following PT treatment.                           PT Education - 07/21/15 0804    Education provided Yes   Education Details LE strengthening, core stabilization    Person(s) Educated Patient   Methods Explanation;Demonstration;Verbal cues   Comprehension Verbalized understanding;Returned demonstration;Verbal cues required             PT Long Term Goals - 07/01/15 1013    PT LONG TERM GOAL #1   Title Patient will be independent with HEP to increase function and reduce pain by 08/26/15    Time 8   Period Weeks   Status New   PT LONG TERM GOAL #2   Title Patient will be able to walk 1070ft without pain on the 6 minute walk test to indicate improve function by 08/26/15.    Time 8   Period Weeks   Status New   PT LONG TERM GOAL #3   Title Patient will increase R LE strength to 5/5 to allow greater ease of ascent and descent of a flight of stairs without pain to access 2nd story of home by 08/26/15   Time 8   Period Weeks   Status New   PT LONG TERM GOAL #4   Title Patient will score less than a 25% on the Modified Oswestry to indicate improved function with daily tasks by 08/26/15   Time 8   Period Weeks   Status New               Plan - 07/21/15 0857    Clinical Impression Statement Patient instructed in LE strengthening as well as increased core stability and lumbar ROM. Patient required min verbal instruction for improved spinal alignment, and decreased trunk compensation. Patient responded moderately to instruction, but needed  repeated instruction to maintain neutral lumbar position in sitting. Patient reports decreased pain following manual therapy including STM, and grade III PA and L unilateral mobilizations to 3/10 from 5/10. Continued skilled PT is recommended to improve ROM, decrease pain to allow patient to return to PLOF.   Pt will benefit  from skilled therapeutic intervention in order to improve on the following deficits Difficulty walking;Decreased activity tolerance;Pain;Obesity;Postural dysfunction;Improper body mechanics;Decreased endurance;Hypomobility   Rehab Potential Good   Clinical Impairments Affecting Rehab Potential Positive: good results from prior PT. Negative: co-morbidities, chronic condition.    PT Frequency 2x / week   PT Duration 8 weeks   PT Treatment/Interventions ADLs/Self Care Home Management;Gait training;Stair training;Functional mobility training;Therapeutic activities;Therapeutic exercise;Patient/family education;Manual techniques;Compression bandaging;Passive range of motion;Dry needling;Energy conservation;Traction;Moist Heat;Cryotherapy   PT Next Visit Plan increase stability ball exercises and standing exercises, manual    PT Home Exercise Plan continue as given    Consulted and Agree with Plan of Care Patient        Problem List Patient Active Problem List   Diagnosis Date Noted  . History of basal cell cancer 06/11/2015  . Prostate cancer 06/11/2015  . Sciatica of right side 06/11/2015  . Myasthenia gravis 06/11/2015  . Benign essential HTN 02/09/2015  . Combined fat and carbohydrate induced hyperlipemia 12/30/2014  . Artificial cardiac pacemaker 12/30/2014  . Bradycardia 11/21/2014  . Breathlessness on exertion 11/21/2014  . Acquired complete AV block 10/16/2014  . Carpal tunnel syndrome 05/08/2009  . Lumbago 01/09/2009  . 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 Hyperlipidemia 11/08/2006  . Obesity, morbid (more than 100 lbs over ideal  weight or BMI > 40) 07/23/2006  . Myasthenia gravis 07/22/2003    Barrie Folk SPT 07/21/2015   4:37 PM  This entire session was performed under direct supervision and direction of a licensed therapist . I have personally read, edited and approve of the note as written.   Hopkins,Margaret PT, DPT 07/21/2015, 4:37 PM  [place] MAIN [facility] SERVICES [address] [place], [place], [address] Phone: [phone]   Fax:  [phone]

## 2015-07-23 ENCOUNTER — Ambulatory Visit: Payer: 59 | Admitting: Physical Therapy

## 2015-07-23 ENCOUNTER — Encounter: Payer: Self-pay | Admitting: Physical Therapy

## 2015-07-23 DIAGNOSIS — R29898 Other symptoms and signs involving the musculoskeletal system: Secondary | ICD-10-CM

## 2015-07-23 DIAGNOSIS — M79604 Pain in right leg: Secondary | ICD-10-CM

## 2015-07-23 DIAGNOSIS — M545 Low back pain: Secondary | ICD-10-CM | POA: Diagnosis not present

## 2015-07-23 NOTE — Therapy (Signed)
[place] MAIN [facility] SERVICES [address] [place], [place], [address] Phone: [phone]   Fax:  [phone]  Physical Therapy Treatment  Patient Details  Name: Garrett Woods MRN: [id number] Date of Birth: 1956-03-12 Referring Provider:  Birdie Sons, MD  Encounter Date: 07/23/2015      PT End of Session - 07/23/15 0936    Visit Number 7   Number of Visits 17   Date for PT Re-Evaluation 08/26/15   PT Start Time 0800   PT Stop Time 0845   PT Time Calculation (min) 45 min   Activity Tolerance Patient tolerated treatment well   Behavior During Therapy [facility] for tasks assessed/performed      Past Medical History  Diagnosis Date  . Arthritis   . Elevated PSA   . Prostate cancer   . Microscopic hematuria   . Gout   . Heart murmur   . Hypertension     Essential  . Hyperlipemia   . Pacemaker   . History of chicken pox     Past Surgical History  Procedure Laterality Date  . Knee surgery Right 1988    Tear of LCL of knee  . Melanoma excision  2013,2015  . Basal cell carcinoma excision  12/28/2012    Anterior neck, done by Dr. Lacinda Axon at [facility]  . Carpal tunnel release Left     There were no vitals filed for this visit.  Visit Diagnosis:  Lumbar pain with radiation down right leg  Weakness of right leg      Subjective Assessment - 07/23/15 0806    Subjective Patient reports the he feels "okay" upon arrvial to PT, he states that he feels like his back is a little better, but he was in a lot of pain yesterday. He states that his back pain got to a 4/10 with walking into rehab department from parkinglot, but he has no pain after 2 minute rest.    Pertinent History Prostate cancer; just finished recent radiation treatment that ended in July. Melanoma surgey on the back in January 2016, [place] maker placement in March 2016.     Limitations Walking;Standing;Lifting;House hold activities   How long can you sit comfortably?  Sitting does not bother patient most days    How long can you stand comfortably? not very long, just a few minutes    How long can you walk comfortably? Not walking long distances, approximately 1000 ft    Diagnostic tests xray in may 2015 - showed disc changes L3-S1    Patient Stated Goals Be able to walk without pain for longer distance, play golf again.    Currently in Pain? No/denies   Pain Onset More than a month ago        Treatment  Nustep 4 minutes Level 3 (unbilled )    Standing lumbar extension 2x10 (slight increase in pain)  PT provided min verbal instruction for increased ROM and improve segmental movement.   Supine  Lumbar rotations with LE on small therapy ball 2x1 minute  Pelvic rocks 2x12  Bridges 2x12 BLE  Prone hip extension 2x 12 BLE Prone press ups 2x12.   Min verbal instruction provided by PT for proper ROM with Prone press ups for increased segmental movement. Min instruction also provided for increase ROM and decreased co-contraction of low back musculature. Patient responded well to instruction.   Seated on therapy ball :  Trunk rotations 2x15  Marches 2x12 BLE Hip abduction 2x12  Press out lateral resistance from red tband x12 each direction Resisted rotation to neutral with red tband x12 each direction   Muscle energy  Resisted hip flexion for 5 second hold on the L LE 2x10  Resisted hip extension for 5 second hold on the R LE 2x10   Min verbal instruction for increased ROM, proper exercise set up and improve activation of the deep core musculature. Good response to instruction.   PT attempted Mechanical traction, but was unable to perform due to body habitus.   Manual therapy  PT performed PA grade III mobilizations to the lumbar and low thoracic spine x30 seconds at each level from T9-L5.  PT performed L unilateral grade III mobilizations to T11-L5 x30 seconds at each level  PT performed R unilateral grade III mobilizations to  T11-L5 x 30 seconds at each Level PT performed BLE hip flexor stretch 3x20 seconds  PT performed R quadriceps stech with contract/relax. 6 second contraction, 10 second stretch.      Patient reports decreased pain in the low back to 2/10 with gait following PT treatment, but states that it still feels stiff.                           PT Education - 07/23/15 0935    Education provided Yes   Education Details Lumbar ROM, core stabilization.    Person(s) Educated Patient   Methods Explanation;Demonstration;Verbal cues   Comprehension Verbalized understanding;Verbal cues required;Returned demonstration             PT Long Term Goals - 07/01/15 1013    PT LONG TERM GOAL #1   Title Patient will be independent with HEP to increase function and reduce pain by 08/26/15    Time 8   Period Weeks   Status New   PT LONG TERM GOAL #2   Title Patient will be able to walk 1048ft without pain on the 6 minute walk test to indicate improve function by 08/26/15.    Time 8   Period Weeks   Status New   PT LONG TERM GOAL #3   Title Patient will increase R LE strength to 5/5 to allow greater ease of ascent and descent of a flight of stairs without pain to access 2nd story of home by 08/26/15   Time 8   Period Weeks   Status New   PT LONG TERM GOAL #4   Title Patient will score less than a 25% on the Modified Oswestry to indicate improved function with daily tasks by 08/26/15   Time 8   Period Weeks   Status New               Plan - 07/23/15 0936    Clinical Impression Statement Patient instructed in Lumbar ROM exercises in increase lumbar lordosis and trunk rotation and increase core stability. Patient required min verbal instruction for increased core activation, improve ROM, and decreased co-activation of low back with LE movement. Patient responded well to instruction. PT performed manual therapy including Grade III mobilization and R hip flexor stretches.  Patient reports slight decrease in back pain following manual therapy but reports that he still feels stiff in the low back. Continued skilled PT is recommended to improve LE strength and decrease pain to improve function in the work place.    Pt will benefit from skilled therapeutic intervention in order to improve on the following deficits Difficulty walking;Decreased activity tolerance;Pain;Obesity;Postural dysfunction;Improper body mechanics;Decreased  endurance;Hypomobility   Rehab Potential Good   Clinical Impairments Affecting Rehab Potential Positive: good results from prior PT. Negative: co-morbidities, chronic condition.    PT Frequency 2x / week   PT Duration 8 weeks   PT Treatment/Interventions ADLs/Self Care Home Management;Gait training;Stair training;Functional mobility training;Therapeutic activities;Therapeutic exercise;Patient/family education;Manual techniques;Compression bandaging;Passive range of motion;Dry needling;Energy conservation;Traction;Moist Heat;Cryotherapy   PT Next Visit Plan  and standing exercises, manual, increase stability ball exercises   PT Home Exercise Plan continue as given    Consulted and Agree with Plan of Care Patient        Problem List Patient Active Problem List   Diagnosis Date Noted  . History of basal cell cancer 06/11/2015  . Prostate cancer 06/11/2015  . Sciatica of right side 06/11/2015  . Myasthenia gravis 06/11/2015  . Benign essential HTN 02/09/2015  . Combined fat and carbohydrate induced hyperlipemia 12/30/2014  . Artificial cardiac pacemaker 12/30/2014  . Bradycardia 11/21/2014  . Breathlessness on exertion 11/21/2014  . Acquired complete AV block 10/16/2014  . Carpal tunnel syndrome 05/08/2009  . Lumbago 01/09/2009  . 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 Hyperlipidemia 11/08/2006  . Obesity, morbid (more than 100 lbs over ideal weight or BMI > 40) 07/23/2006  . Myasthenia gravis 07/22/2003    Barrie Folk SPT 07/23/2015   3:06 PM  This entire session was performed under direct supervision and direction of a licensed therapist. I have personally read, edited and approve of the note as written.  Hopkins,Margaret PT, DPT 07/23/2015, 3:06 PM  [place] MAIN [facility] SERVICES [address] [place], [place], [address] Phone: [phone]   Fax:  [phone]

## 2015-07-25 ENCOUNTER — Other Ambulatory Visit: Payer: Self-pay | Admitting: Family Medicine

## 2015-07-28 ENCOUNTER — Encounter: Payer: Self-pay | Admitting: Physical Therapy

## 2015-07-28 ENCOUNTER — Ambulatory Visit: Payer: 59 | Admitting: Physical Therapy

## 2015-07-28 DIAGNOSIS — R29898 Other symptoms and signs involving the musculoskeletal system: Secondary | ICD-10-CM

## 2015-07-28 DIAGNOSIS — M545 Low back pain, unspecified: Secondary | ICD-10-CM

## 2015-07-28 NOTE — Therapy (Signed)
[place] MAIN [facility] SERVICES [address] [place], [place], [address] Phone: [phone]   Fax:  [phone]  Physical Therapy Treatment  Patient Details  Name: Garrett Woods MRN: [id number] Date of Birth: 04-03-1956 Referring Provider:  Birdie Sons, MD  Encounter Date: 07/28/2015      PT End of Session - 07/28/15 1041    Visit Number 8   Number of Visits 17   Date for PT Re-Evaluation 08/26/15   PT Start Time 0800   PT Stop Time 0845   PT Time Calculation (min) 45 min   Activity Tolerance Patient tolerated treatment well   Behavior During Therapy [facility] for tasks assessed/performed      Past Medical History  Diagnosis Date  . Arthritis   . Elevated PSA   . Prostate cancer   . Microscopic hematuria   . Gout   . Heart murmur   . Hypertension     Essential  . Hyperlipemia   . Pacemaker   . History of chicken pox     Past Surgical History  Procedure Laterality Date  . Knee surgery Right 1988    Tear of LCL of knee  . Melanoma excision  2013,2015  . Basal cell carcinoma excision  12/28/2012    Anterior neck, done by Dr. Lacinda Axon at [facility]  . Carpal tunnel release Left     There were no vitals filed for this visit.  Visit Diagnosis:  Lumbar pain with radiation down right leg  Weakness of right leg      Subjective Assessment - 07/28/15 0805    Subjective patient states that he is doing well upon arrival to PT. Reports that his back pain was better over the weekend with less intensity and more time to onset of pain. He reports that he is a little stiff this morning, but other wise feels good.    Pertinent History Prostate cancer; just finished recent radiation treatment that ended in July. Melanoma surgey on the back in January 2016, [place] maker placement in March 2016.     Limitations Walking;Standing;Lifting;House hold activities   How long can you sit comfortably? Sitting does not bother patient most days    How  long can you stand comfortably? not very long, just a few minutes    How long can you walk comfortably? Not walking long distances, approximately 1000 ft    Diagnostic tests xray in may 2015 - showed disc changes L3-S1    Patient Stated Goals Be able to walk without pain for longer distance, play golf again.    Currently in Pain? No/denies   Pain Score 0-No pain              Treatment  Nustep 4 minutes Level 3 (unbilled )   PT provided min verbal instruction for increased ROM and improve segmental movement.   Supine  Lumbar rotations with LE on small therapy ball 2x1 minute  Pelvic rocks 2x12  Bridges 2x12 BLE  Prone hip extension 2x 12 BLE Prone press ups 2x12.   Min verbal instruction for increase ROM, improve core activation, and proper exercise positioning. Patient responded well to instruction and was able to demonstrate improve exercise technique with decreased low back contraction with hip extension in prone   Qped exercises  Diaphragmatic breathing x1 minute  Arm raises x10 BUE  Hip extension  x10 BLE.   Patient reports slight increase in pain with hip extension. Moderate verbal and tactile instruction to  improved exercises technique and decreased lumbar rotaiton. Patient responded moderate to instruction   Seated on therapy ball : Marches 2x12 BLE Hip abduction 2x12  Press out lateral resistance from red tband x12 each direction   Min verbal instruction for increased ROM, proper exercise set up and improve activation of the deep core musculature. Good response to instruction.   Manual therapy  PT performed PA grade III mobilizations to the lumbar and low thoracic spine x30 seconds at each level from T9-L5.  PT performed L unilateral grade III mobilizations to T11-L5 x30 seconds at each level  PT performed BLE hip flexor stretch 3x20 seconds  Prone press ups with overpressure at T11-L4 for increased segmental movement.  .     Patient reports  decreased pain in the low back to 2/10 with gait following PT treatment, with decreased stiffness.                     PT Education - 07/28/15 0807    Education provided Yes   Education Details Lumbar ROM. Core stabilization.    Person(s) Educated Patient   Methods Explanation;Demonstration;Verbal cues   Comprehension Verbalized understanding;Returned demonstration;Verbal cues required             PT Long Term Goals - 07/01/15 1013    PT LONG TERM GOAL #1   Title Patient will be independent with HEP to increase function and reduce pain by 08/26/15    Time 8   Period Weeks   Status New   PT LONG TERM GOAL #2   Title Patient will be able to walk 1037ft without pain on the 6 minute walk test to indicate improve function by 08/26/15.    Time 8   Period Weeks   Status New   PT LONG TERM GOAL #3   Title Patient will increase R LE strength to 5/5 to allow greater ease of ascent and descent of a flight of stairs without pain to access 2nd story of home by 08/26/15   Time 8   Period Weeks   Status New   PT LONG TERM GOAL #4   Title Patient will score less than a 25% on the Modified Oswestry to indicate improved function with daily tasks by 08/26/15   Time 8   Period Weeks   Status New               Plan - 07/28/15 1218    Clinical Impression Statement Patient instructed in core activation exercises as well improved lumbar ROM. PT provided min-moderate verbal instruction for increased core activation with qped exercises. Patient responded moderated to instruction, but demonstrated excessive lumbar rotation with hip extension. Patient Demonstrated improve control with therapy ball exercises. Patient was noted to have improved hip flexibility on the R LE compared to previous PT treatment session. Manual therapy provided to increase lumbar ROM and decreased stiffness. Patient reports slight decrease in pain following manual therapy. Continued skilled PT [place]  recommended to improve lumbar ROM, decrease pain and improve function with gait.   Pt will benefit from skilled therapeutic intervention in order to improve on the following deficits Difficulty walking;Decreased activity tolerance;Pain;Obesity;Postural dysfunction;Improper body mechanics;Decreased endurance;Hypomobility   Rehab Potential Good   Clinical Impairments Affecting Rehab Potential Positive: good results from prior PT. Negative: co-morbidities, chronic condition.    PT Frequency 2x / week   PT Duration 8 weeks   PT Treatment/Interventions ADLs/Self Care Home Management;Gait training;Stair training;Functional mobility training;Therapeutic activities;Therapeutic exercise;Patient/family education;Manual techniques;Compression bandaging;Passive  range of motion;Dry needling;Energy conservation;Traction;Moist Heat;Cryotherapy   PT Next Visit Plan increaesd stability ball exercises. standing exercises-ASSESS GOALS   PT Home Exercise Plan continue as given    Consulted and Agree with Plan of Care Patient        Problem List Patient Active Problem List   Diagnosis Date Noted  . History of basal cell cancer 06/11/2015  . Prostate cancer 06/11/2015  . Sciatica of right side 06/11/2015  . Myasthenia gravis 06/11/2015  . Benign essential HTN 02/09/2015  . Combined fat and carbohydrate induced hyperlipemia 12/30/2014  . Artificial cardiac pacemaker 12/30/2014  . Bradycardia 11/21/2014  . Breathlessness on exertion 11/21/2014  . Acquired complete AV block 10/16/2014  . Carpal tunnel syndrome 05/08/2009  . Lumbago 01/09/2009  . 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 Hyperlipidemia 11/08/2006  . Obesity, morbid (more than 100 lbs over ideal weight or BMI > 40) 07/23/2006  . Myasthenia gravis 07/22/2003   Barrie Folk SPT 07/28/2015   3:17 PM  This entire session was performed under direct supervision and direction of a licensed therapist. I have personally  read, edited and approve of the note as written.  Hopkins,Margaret PT, DPT 07/28/2015, 3:17 PM  [place] MAIN [facility] SERVICES [address] [place], [place], [address] Phone: [phone]   Fax:  [phone]

## 2015-07-30 ENCOUNTER — Ambulatory Visit: Payer: 59 | Admitting: Physical Therapy

## 2015-07-30 ENCOUNTER — Encounter: Payer: Self-pay | Admitting: Physical Therapy

## 2015-07-30 DIAGNOSIS — M79604 Pain in right leg: Secondary | ICD-10-CM

## 2015-07-30 DIAGNOSIS — M545 Low back pain: Secondary | ICD-10-CM | POA: Diagnosis not present

## 2015-07-30 DIAGNOSIS — R29898 Other symptoms and signs involving the musculoskeletal system: Secondary | ICD-10-CM

## 2015-07-30 NOTE — Therapy (Signed)
[place] MAIN [facility] SERVICES [address] [place], [place], [address] Phone: [phone]   Fax:  [phone]  Physical Therapy Treatment Progress Note 07/01/15-07/30/15  Patient Details  Name: Garrett Woods MRN: [id number] Date of Birth: [birth year] Referring Provider:  Birdie Sons, MD  Encounter Date: 07/30/2015      PT End of Session - 07/30/15 0809    Visit Number 9   Number of Visits 17   Date for PT Re-Evaluation 08/26/15   PT Start Time 0800   PT Stop Time 0845   PT Time Calculation (min) 45 min   Activity Tolerance Patient tolerated treatment well   Behavior During Therapy [facility] for tasks assessed/performed      Past Medical History  Diagnosis Date  . Arthritis   . Elevated PSA   . Prostate cancer   . Microscopic hematuria   . Gout   . Heart murmur   . Hypertension     Essential  . Hyperlipemia   . Pacemaker   . History of chicken pox     Past Surgical History  Procedure Laterality Date  . Knee surgery Right 1988    Tear of LCL of knee  . Melanoma excision  2013,2015  . Basal cell carcinoma excision  12/28/2012    Anterior neck, done by Dr. Lacinda Axon at [facility]  . Carpal tunnel release Left     There were no vitals filed for this visit.  Visit Diagnosis:  Lumbar pain with radiation down right leg  Weakness of right leg      Subjective Assessment - 07/30/15 0804    Subjective Patient reports that he is doing "okay" this morning. He reports that his back feels stiff this AM, and that he was in some pain yesterday, even at rest. He reports that he took some muscle relaxers this morning and states that his pain was 4/10 after medication.    Pertinent History Prostate cancer; just finished recent radiation treatment that ended in July. Melanoma surgey on the back in January 2016, [place] maker placement in March 2016.     Limitations Walking;Standing;Lifting;House hold activities   How long can you sit  comfortably? Sitting does not bother patient most days    How long can you stand comfortably? not very long, just a few minutes    How long can you walk comfortably? Not walking long distances, approximately 1000 ft    Diagnostic tests xray in may 2015 - showed disc changes L3-S1    Patient Stated Goals Be able to walk without pain for longer distance, play golf again.    Currently in Pain? Yes   Pain Score 4    Pain Location Back   Pain Orientation Right   Pain Descriptors / Indicators Aching   Pain Type Chronic pain   Pain Radiating Towards R LE    Pain Onset More than a month ago   Aggravating Factors  walking            [facility] PT Assessment - 07/30/15 0001    Observation/Other Assessments   Modified Oswertry 44% impaired. ( no change from 07/01/15)   Strength   Right Hip Flexion 4+/5   Right Hip ABduction 5/5   Right Hip ADduction 5/5   Left Hip Flexion 4+/5   Left Hip ABduction 5/5   Left Hip ADduction 5/5   Right Knee Flexion 5/5   Right Knee Extension 5/5   Left Knee Flexion 5/5   Left  Knee Extension 5/5   Right Ankle Dorsiflexion 4+/5   Right Ankle Plantar Flexion 4/5   Left Ankle Dorsiflexion 5/5   Left Ankle Plantar Flexion 4+/5   6 minute walk test results    Aerobic Endurance Distance Walked 800   Endurance additional comments No change from 07/01/15. Patient required 3 sitting rest breaks due to back pain. Was noted to have decreased R sided pelvic rotation.            Treatment:  Nustep Level 5, 3 minutes   Therex Supine lumbar rotations on therapy ball x 1 minute sidelying Archer low back stretch x5 each direction with 10 second hold  Prone lumbar press ups 2x12  PT provided Min verbal instruction for movement in pain free range of motion, increased hold time to increase ROM.   Patient instructed in standardized outcome measures including 6 minute walk test, modified ODI, and Manual muscle testing. See above for result   Manual therapy performed  by PT:  Prone hip flexor stretches 2x20 seconds BLE Grade III PA and L unilateral mobilizations to T10-L5 x 40 seconds each segment  Lumbar traction with gait belt, no change in symptoms noted by patient.    Qped:  Lumbar stretch with hands above head hip back Lateral stretch to L with hand above head and hips back Arching back/rounding back (Cat/camel stretch) x 10  PT provided moderate verbal instruction for proper exercise set up and improve postioning to increase ROM and improve stretch. Patient responded well to instructions    Patient noted decreased pain in low back following manual therapy and qped exercises.                    PT Education - 07/30/15 0809    Education provided Yes   Education Details Lumbar ROM, Standardized outcome measures    Person(s) Educated Patient   Methods Explanation;Demonstration;Verbal cues   Comprehension Verbalized understanding;Returned demonstration;Verbal cues required             PT Long Term Goals - 07/30/15 0817    PT LONG TERM GOAL #1   Title Patient will be independent with HEP to increase function and reduce pain by 08/26/15    Time 8   Period Weeks   Status On-going   PT LONG TERM GOAL #2   Title Patient will be able to walk 1016f without pain on the 6 minute walk test to indicate improve function by 08/26/15.    Time 8   Period Weeks   Status Not Met   PT LONG TERM GOAL #3   Title Patient will increase R LE strength to 5/5 to allow greater ease of ascent and descent of a flight of stairs without pain to access 2nd story of home by 08/26/15   Time 8   Period Weeks   Status Partially Met   PT LONG TERM GOAL #4   Title Patient will score less than a 25% on the Modified Oswestry to indicate improved function with daily tasks by 08/26/15   Time 8   Period Weeks   Status Not Met               Plan - 07/30/15 1048    Clinical Impression Statement Patient instructed in Lumbar ROM exercises as well as  standardized outcome measures to assess patient progress. Patient demonstrated improvements in LE strength through manual muscle testing and reports that the numbness in the R foot as reduced slightly. Patient continues  to demonstrate impairments with no change in score with the 6 minute walk test or the Modified Oswestry disability index. Patient does state that he has felt like it takes his pain longer to come on while walking in the community. PT performed manual therapy including Grade III mobilizations to the lumbar spine and PROM/stretching to the anterior hip musculature. Patient reports decrease in pain from 5/10 to 2/10 following manual therapy. Continued skilled PT is recommended to improve gait, decrease pain and increase strength to allow patient to return to PLOF.   Pt will benefit from skilled therapeutic intervention in order to improve on the following deficits Difficulty walking;Decreased activity tolerance;Pain;Obesity;Postural dysfunction;Improper body mechanics;Decreased endurance;Hypomobility   Rehab Potential Good   Clinical Impairments Affecting Rehab Potential Positive: good results from prior PT. Negative: co-morbidities, chronic condition.    PT Frequency 2x / week   PT Duration 8 weeks   PT Treatment/Interventions ADLs/Self Care Home Management;Gait training;Stair training;Functional mobility training;Therapeutic activities;Therapeutic exercise;Patient/family education;Manual techniques;Compression bandaging;Passive range of motion;Dry needling;Energy conservation;Traction;Moist Heat;Cryotherapy   PT Next Visit Plan increaesd stability ball exercises. standing exercises-ASSESS GOALS   PT Home Exercise Plan continue as given    Consulted and Agree with Plan of Care Patient        Problem List Patient Active Problem List   Diagnosis Date Noted  . History of basal cell cancer 06/11/2015  . Prostate cancer 06/11/2015  . Sciatica of right side 06/11/2015  . Myasthenia  gravis 06/11/2015  . Benign essential HTN 02/09/2015  . Combined fat and carbohydrate induced hyperlipemia 12/30/2014  . Artificial cardiac pacemaker 12/30/2014  . Bradycardia 11/21/2014  . Breathlessness on exertion 11/21/2014  . Acquired complete AV block 10/16/2014  . Carpal tunnel syndrome 05/08/2009  . Lumbago 01/09/2009  . 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 Hyperlipidemia 11/08/2006  . Obesity, morbid (more than 100 lbs over ideal weight or BMI > 40) 07/23/2006  . Myasthenia gravis 07/22/2003   Barrie Folk SPT 07/30/2015   5:05 PM  This entire session was performed under direct supervision and direction of a licensed therapist. I have personally read, edited and approve of the note as written.   Hopkins,Margaret PT, DPT 07/30/2015, 5:05 PM  [place] MAIN [facility] SERVICES [address] [place], [place], [address] Phone: [phone]   Fax:  [phone]

## 2015-08-01 ENCOUNTER — Other Ambulatory Visit: Payer: Self-pay | Admitting: Urology

## 2015-08-04 ENCOUNTER — Ambulatory Visit: Payer: 59 | Attending: Family Medicine | Admitting: Physical Therapy

## 2015-08-04 ENCOUNTER — Encounter: Payer: Self-pay | Admitting: Physical Therapy

## 2015-08-04 DIAGNOSIS — M545 Low back pain: Secondary | ICD-10-CM | POA: Diagnosis not present

## 2015-08-04 DIAGNOSIS — R29898 Other symptoms and signs involving the musculoskeletal system: Secondary | ICD-10-CM | POA: Diagnosis present

## 2015-08-04 DIAGNOSIS — M79604 Pain in right leg: Secondary | ICD-10-CM

## 2015-08-04 NOTE — Therapy (Signed)
[place] MAIN [facility] SERVICES [address] [place], [place], [address] Phone: [phone]   Fax:  [phone]  Physical Therapy Treatment  Patient Details  Name: Burman Bruington MRN: [id number] Date of Birth: 1956-05-06 Referring Provider:  Birdie Sons, MD  Encounter Date: 08/04/2015      PT End of Session - 08/04/15 0817    Visit Number 10   Number of Visits 17   Date for PT Re-Evaluation 08/26/15   PT Start Time 0800   PT Stop Time 0845   PT Time Calculation (min) 45 min   Activity Tolerance Patient tolerated treatment well   Behavior During Therapy [facility] for tasks assessed/performed      Past Medical History  Diagnosis Date  . Arthritis   . Elevated PSA   . Prostate cancer ([place])   . Microscopic hematuria   . Gout   . Heart murmur   . Hypertension     Essential  . Hyperlipemia   . Pacemaker   . History of chicken pox     Past Surgical History  Procedure Laterality Date  . Knee surgery Right 1988    Tear of LCL of knee  . Melanoma excision  2013,2015  . Basal cell carcinoma excision  12/28/2012    Anterior neck, done by Dr. Lacinda Axon at [facility]  . Carpal tunnel release Left     There were no vitals filed for this visit.  Visit Diagnosis:  Lumbar pain with radiation down right leg  Weakness of right leg      Subjective Assessment - 08/04/15 0806    Subjective Patient reports reports that he is "hanging in there" upon arrival. He states that his back feels stiff and his R ankle is a little sore. He states that he has had gout in that ankle previously.    Pertinent History Prostate cancer; just finished recent radiation treatment that ended in July. Melanoma surgey on the back in January 2016, [place] maker placement in March 2016.     Limitations Walking;Standing;House hold activities   How long can you sit comfortably? Sitting does not bother patient most days    How long can you stand comfortably? not very long,  just a few minutes    How long can you walk comfortably? Not walking long distances, approximately 1000 ft    Diagnostic tests xray in may 2015 - showed disc changes L3-S1    Patient Stated Goals Be able to walk without pain for longer distance, play golf again.    Currently in Pain? Yes   Pain Score 0-No pain   Pain Onset More than a month ago           Nustep level 4, 3 minutes (unbilled)    PT noted that patient's R PSIS appeared more prominent and slightly more cephalic compared to the L on this day.    Muscle energy for R posterior pelvic rotation: Isometric R hip extension  2x8  Isometric L hip flexion 2x8   Qped lumber stretch 3x20 seconds  Qped  R lumbar stretch 3x20 second  Qped core stabilization with alternating UE lifts  2x10 each UE Qped abdominal drawing in maneuver 2x10   Prone hip extension 2x10 BLE  Prone lumbar extension 2x12   PT provided Moderate verbal and tactile instruction for improved positioning, increased ROM, and increased hold time of stretches and strengthening exercises. Patient responded well to instruction from PT. Slight increase in pain was noted at  the R PSIS with muscle energy technique to improve pelvic alignment.   PT performed Manual therapy : Mobilization with movement for posterior  R pelvic rotation 2x5  Foraminal gapping, R side L3-4, grade III mobilization  2x 30  T9-L5 grade III PA mobilizations  1 bout x30 seconds each level T9-L5 Grade III L Unilateral mobilizations 1 bout x30 seconds each level  Sidelying R LE distraction 3 x 15 seconds  Anterior Grade II mobilizations of the R sided pelvis in prone x 30 seconds   Patient reports no pain with walking in therapy gym in the R sided low back following manual therapy and slight decrease in ankle pain with walking.                       PT Education - 08/04/15 1020    Education provided Yes   Education Details Lumbar ROM and Pelvic rotation    Person(s)  Educated Patient   Methods Explanation;Demonstration;Verbal cues   Comprehension Returned demonstration;Verbalized understanding;Verbal cues required             PT Long Term Goals - 07/30/15 0817    PT LONG TERM GOAL #1   Title Patient will be independent with HEP to increase function and reduce pain by 08/26/15    Time 8   Period Weeks   Status On-going   PT LONG TERM GOAL #2   Title Patient will be able to walk 1061f without pain on the 6 minute walk test to indicate improve function by 08/26/15.    Time 8   Period Weeks   Status Not Met   PT LONG TERM GOAL #3   Title Patient will increase R LE strength to 5/5 to allow greater ease of ascent and descent of a flight of stairs without pain to access 2nd story of home by 08/26/15   Time 8   Period Weeks   Status Partially Met   PT LONG TERM GOAL #4   Title Patient will score less than a 25% on the Modified Oswestry to indicate improved function with daily tasks by 08/26/15   Time 8   Period Weeks   Status Not Met               Plan - 08/04/15 1021    Clinical Impression Statement PT noted increased prominence of the R PSIS with palpation. Patient instructed in muscle energy techniques to improve pelvic alignment. PT also performed manual techniques to improve pelvic positioning and increase movement in the lumbar spine; patient reports no pain with standing following manual therapy. Instruction provided for exercises to improved lumbar stabilization as well as decrease radicular symptoms with lumbar extension. Continued skilled PT is recommended to improve LE strength, decrease pain, and improve function at home and at work.    Pt will benefit from skilled therapeutic intervention in order to improve on the following deficits Difficulty walking;Decreased activity tolerance;Pain;Obesity;Postural dysfunction;Improper body mechanics;Decreased endurance;Hypomobility   Rehab Potential Good   Clinical Impairments Affecting  Rehab Potential Positive: good results from prior PT. Negative: co-morbidities, chronic condition.    PT Frequency 2x / week   PT Duration 8 weeks   PT Treatment/Interventions ADLs/Self Care Home Management;Gait training;Stair training;Functional mobility training;Therapeutic activities;Therapeutic exercise;Patient/family education;Manual techniques;Compression bandaging;Passive range of motion;Dry needling;Energy conservation;Traction;Moist Heat;Cryotherapy   PT Next Visit Plan LE strengthening, core stabilization.    PT Home Exercise Plan continue as given    Consulted and Agree with Plan of Care Patient  Problem List Patient Active Problem List   Diagnosis Date Noted  . History of basal cell cancer 06/11/2015  . Prostate cancer ([place]) 06/11/2015  . Sciatica of right side 06/11/2015  . Myasthenia gravis ([place]) 06/11/2015  . Benign essential HTN 02/09/2015  . Combined fat and carbohydrate induced hyperlipemia 12/30/2014  . Artificial cardiac pacemaker 12/30/2014  . Bradycardia 11/21/2014  . Breathlessness on exertion 11/21/2014  . Acquired complete AV block ([place]) 10/16/2014  . Carpal tunnel syndrome 05/08/2009  . Lumbago 01/09/2009  . 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 Hyperlipidemia 11/08/2006  . Obesity, morbid (more than 100 lbs over ideal weight or BMI > 40) ([place]) 07/23/2006  . Myasthenia gravis ([place]) 07/22/2003   Barrie Folk SPT 08/04/2015   4:20 PM  This entire session was performed under direct supervision and direction of a licensed therapist . I have personally read, edited and approve of the note as written.  Hopkins,Margaret PT, DPT 08/04/2015, 4:20 PM  [place] MAIN [facility] SERVICES [address] [place], [place], [address] Phone: [phone]   Fax:  [phone]

## 2015-08-06 ENCOUNTER — Encounter: Payer: Self-pay | Admitting: Physical Therapy

## 2015-08-06 ENCOUNTER — Ambulatory Visit: Payer: 59 | Admitting: Physical Therapy

## 2015-08-06 DIAGNOSIS — M545 Low back pain: Secondary | ICD-10-CM

## 2015-08-06 DIAGNOSIS — R29898 Other symptoms and signs involving the musculoskeletal system: Secondary | ICD-10-CM

## 2015-08-06 DIAGNOSIS — M79604 Pain in right leg: Secondary | ICD-10-CM

## 2015-08-06 NOTE — Therapy (Signed)
[place] MAIN [facility] SERVICES [address] [place], [place], [address] Phone: [phone]   Fax:  [phone]  Physical Therapy Treatment  Patient Details  Name: Garrett Woods MRN: [id number] Date of Birth: December 24, 1955 Referring Provider:  Birdie Sons, MD  Encounter Date: 08/06/2015      PT End of Session - 08/06/15 0847    Visit Number 11   Number of Visits 17   Date for PT Re-Evaluation 08/26/15   PT Start Time 0800   PT Stop Time 0845   PT Time Calculation (min) 45 min   Activity Tolerance Patient tolerated treatment well   Behavior During Therapy [facility] for tasks assessed/performed      Past Medical History  Diagnosis Date  . Arthritis   . Elevated PSA   . Prostate cancer ([place])   . Microscopic hematuria   . Gout   . Heart murmur   . Hypertension     Essential  . Hyperlipemia   . Pacemaker   . History of chicken pox     Past Surgical History  Procedure Laterality Date  . Knee surgery Right 1988    Tear of LCL of knee  . Melanoma excision  2013,2015  . Basal cell carcinoma excision  12/28/2012    Anterior neck, done by Dr. Lacinda Axon at [facility]  . Carpal tunnel release Left     There were no vitals filed for this visit.  Visit Diagnosis:  Lumbar pain with radiation down right leg  Weakness of right leg      Subjective Assessment - 08/06/15 0805    Subjective Patient reports that he is doing well today, and reports that he feels "looser" today than he usually does in the morning. He states that he felt pretty good on Monday following treatment, but wa a little sore on Tuesday.    Patient is accompained by: Family member   Pertinent History Prostate cancer; just finished recent radiation treatment that ended in July. Melanoma surgey on the back in January 2016, [place] maker placement in March 2016.     Limitations Walking;Standing;House hold activities   How long can you sit comfortably? Sitting does not bother  patient most days    How long can you stand comfortably? not very long, just a few minutes    How long can you walk comfortably? Not walking long distances, approximately 1000 ft    Diagnostic tests xray in may 2015 - showed disc changes L3-S1    Patient Stated Goals Be able to walk without pain for longer distance, play golf again.    Currently in Pain? No/denies   Pain Onset More than a month ago          Nustep level, 4, 3 minutes (unbilled)  Standing hip flexor stretch 2x20 seconds BLE   Supine: Lumbar rotation on therapy ball x1 minute  Bridges x12  Muscle energy technique for R sided posterior pelvic rotation  L hip flexion 5 second hold 2x3  R hip extension, 5 second hold, 2x3  Prone press ups 2x12  Prone hip extension 2x10   Standing therex with red tband  BLE Hip flexion x10  BLE Hip abduction x10  BLE hip extension x10   Qped lumbar stretch 3x20 seconds  Qped R lumbar stretch 2x20 seconds   PT provided min verbal and tactile instruction for improved positioning, increased ROM with supine and standing exercises, improve deep core activation, and decreased compensation of the trunk with  R LE movement. Patient responded moderately to instruction but demonstrated difficulty with trunk compensation for R LE movement in standing. Following standing therex, patient reports mild increase in pain to 3/10 in the R sided lower back.   PT performed manual therapy including:  R LE/pelvic distraction in sidelying 3x20 seconds  R LE hip flexor stretch 2x20 seconds  Mobilization with movement to the R innominate for posterior rotation 5 second hold 2x5  PA grade III mobilizations T9-L5 1x30 seconds each segment (slight tenderness noted at L3)    Foraminal gapping grade III mobilization for L3-L4 2x30    Patient reports decreased pain to 0/10 following manual therapy. He also reports that his back feel less tight compared to prior to PT treatment  today.                        PT Education - 08/06/15 0807    Education provided Yes   Education Details Manual therapy, lumbar ROM, LE strengthening   Person(s) Educated Patient   Methods Explanation;Demonstration;Verbal cues   Comprehension Verbalized understanding;Returned demonstration;Verbal cues required             PT Long Term Goals - 07/30/15 0817    PT LONG TERM GOAL #1   Title Patient will be independent with HEP to increase function and reduce pain by 08/26/15    Time 8   Period Weeks   Status On-going   PT LONG TERM GOAL #2   Title Patient will be able to walk 1016f without pain on the 6 minute walk test to indicate improve function by 08/26/15.    Time 8   Period Weeks   Status Not Met   PT LONG TERM GOAL #3   Title Patient will increase R LE strength to 5/5 to allow greater ease of ascent and descent of a flight of stairs without pain to access 2nd story of home by 08/26/15   Time 8   Period Weeks   Status Partially Met   PT LONG TERM GOAL #4   Title Patient will score less than a 25% on the Modified Oswestry to indicate improved function with daily tasks by 08/26/15   Time 8   Period Weeks   Status Not Met               Plan - 08/06/15 1152    Clinical Impression Statement Patient instructed in LE strengthening and lumbar ROM exercises. Moderate verbal instruction with therex provided to decrease compensation of the trunk with standing exercises; only moderate response noted to instruction. Mild increase is pain with standing therex. Manual therapy performed to improve pelvic rotation, improve segmental lumbar movement and decrease pain. Patient reports decreased pain to 0/10 following manual therapy with walking. Continued skilled PT is recommended to decrease low back pain and increase strength to allow return to PLOF.      Pt will benefit from skilled therapeutic intervention in order to improve on the following deficits  Difficulty walking;Decreased activity tolerance;Pain;Obesity;Postural dysfunction;Improper body mechanics;Decreased endurance;Hypomobility   Rehab Potential Good   Clinical Impairments Affecting Rehab Potential Positive: good results from prior PT. Negative: co-morbidities, chronic condition.    PT Frequency 2x / week   PT Duration 8 weeks   PT Treatment/Interventions ADLs/Self Care Home Management;Gait training;Stair training;Functional mobility training;Therapeutic activities;Therapeutic exercise;Patient/family education;Manual techniques;Compression bandaging;Passive range of motion;Dry needling;Energy conservation;Traction;Moist Heat;Cryotherapy   PT Next Visit Plan LE strengthening, core stabilization.    PT Home Exercise Plan  continue as given    Consulted and Agree with Plan of Care Patient        Problem List Patient Active Problem List   Diagnosis Date Noted  . History of basal cell cancer 06/11/2015  . Prostate cancer ([place]) 06/11/2015  . Sciatica of right side 06/11/2015  . Myasthenia gravis ([place]) 06/11/2015  . Benign essential HTN 02/09/2015  . Combined fat and carbohydrate induced hyperlipemia 12/30/2014  . Artificial cardiac pacemaker 12/30/2014  . Bradycardia 11/21/2014  . Breathlessness on exertion 11/21/2014  . Acquired complete AV block ([place]) 10/16/2014  . Carpal tunnel syndrome 05/08/2009  . Lumbago 01/09/2009  . 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 Hyperlipidemia 11/08/2006  . Obesity, morbid (more than 100 lbs over ideal weight or BMI > 40) ([place]) 07/23/2006  . Myasthenia gravis ([place]) 07/22/2003   Barrie Folk SPT 08/06/2015   2:57 PM  This entire session was performed under direct supervision and direction of a licensed therapist . I have personally read, edited and approve of the note as written.  Hopkins,Margaret PT, DPT 08/06/2015, 2:57 PM  [place] MAIN [facility] SERVICES [address]  [place], [place], [address] Phone: [phone]   Fax:  [phone]

## 2015-08-11 ENCOUNTER — Ambulatory Visit: Payer: 59 | Admitting: Physical Therapy

## 2015-08-11 ENCOUNTER — Encounter: Payer: Self-pay | Admitting: Physical Therapy

## 2015-08-11 DIAGNOSIS — R29898 Other symptoms and signs involving the musculoskeletal system: Secondary | ICD-10-CM

## 2015-08-11 DIAGNOSIS — M545 Low back pain, unspecified: Secondary | ICD-10-CM

## 2015-08-11 NOTE — Therapy (Signed)
[place] MAIN [facility] SERVICES [address] [place], [place], [address] Phone: [phone]   Fax:  [phone]  Physical Therapy Treatment  Patient Details  Name: Garrett Woods MRN: [id number] Date of Birth: 12-25-1955 Referring Provider:  Birdie Sons, MD  Encounter Date: 08/11/2015      PT End of Session - 08/11/15 0812    Visit Number 12   Number of Visits 17   Date for PT Re-Evaluation 08/26/15   PT Start Time 0800   PT Stop Time 0845   PT Time Calculation (min) 45 min   Activity Tolerance Patient tolerated treatment well   Behavior During Therapy [facility] for tasks assessed/performed      Past Medical History  Diagnosis Date  . Arthritis   . Elevated PSA   . Prostate cancer ([place])   . Microscopic hematuria   . Gout   . Heart murmur   . Hypertension     Essential  . Hyperlipemia   . Pacemaker   . History of chicken pox     Past Surgical History  Procedure Laterality Date  . Knee surgery Right 1988    Tear of LCL of knee  . Melanoma excision  2013,2015  . Basal cell carcinoma excision  12/28/2012    Anterior neck, done by Dr. Lacinda Axon at [facility]  . Carpal tunnel release Left     There were no vitals filed for this visit.  Visit Diagnosis:  Lumbar pain with radiation down right leg  Weakness of right leg      Subjective Assessment - 08/11/15 0806    Subjective Patient reports that he is doing "okay" today. He reports that his back is a little stiff this morning (4/10).    Patient is accompained by: Family member   Pertinent History Prostate cancer; just finished recent radiation treatment that ended in July. Melanoma surgey on the back in January 2016, [place]amus maker placement in March 2016.     Limitations Walking;Standing;House hold activities   How long can you sit comfortably? Sitting does not bother patient most days    How long can you stand comfortably? not very long, just a few minutes    How long can you  walk comfortably? Not walking long distances, approximately 1000 ft    Diagnostic tests xray in may 2015 - showed disc changes L3-S1    Patient Stated Goals Be able to walk without pain for longer distance, play golf again.    Currently in Pain? Yes   Pain Score 4    Pain Location Back   Pain Orientation Right   Pain Descriptors / Indicators Aching   Pain Type Chronic pain   Pain Onset More than a month ago        Nustep level, 4, 3 minutes (unbilled)     Supine: Lumbar rotation on therapy ball 2x1 minute   Bridges x12 SLR x 12 BLE   Muscle energy technique for R sided posterior pelvic rotation   L hip flexion 5 second hold 2x3   R hip extension, 5 second hold, 2x3 Sidelying archer stretch 3x20 seconds.  sidelying hip abduction x 10 BLE   Prone press ups with overpressure at T12-L4 to improve segmental movement x10 each segment.   Prone hip extension 2x10   Seated therapy ball exercises with Red tband  Hip flexion 2x 10 BLE  Hip abduction  2x12 BLE    Qped lumbar stretch 3x20 seconds   Qped R  lumbar stretch 2x20 seconds   PT provided Min verbal instruction for improved ROM, improved positioning to correct spinal alignment, increased activation of deep core musculature, decrease compensation of the trunk with seated and prone therex. Patient responded well to instruction. He noted slight increase in R SI pain with R sided hip abduction.   PT performed manual therapy including:   R LE hip flexor stretch 2x20 seconds    Mobilization with movement to the R innominate for posterior rotation 5 second hold 2x5  PA grade III mobilizations T9-L5 1x30 seconds each segment  Foraminal gapping grade III mobilization for L3-L4 2x30    Patient reports decreased pain to 1/10 following manual therapy. Mild decrease stiffness in the R sided SI region compared to the start of PT. Patient demonstrated improved gait mechanics with decreased antalgic gait pattern on the R following PT.                              PT Education - 08/11/15 [phone]    Education provided Yes   Education Details Manual therapy,  Lumbar ROM,    Person(s) Educated Patient   Methods Explanation;Demonstration;Verbal cues   Comprehension Verbalized understanding;Returned demonstration;Verbal cues required             PT Long Term Goals - 07/30/15 0817    PT LONG TERM GOAL #1   Title Patient will be independent with HEP to increase function and reduce pain by 08/26/15    Time 8   Period Weeks   Status On-going   PT LONG TERM GOAL #2   Title Patient will be able to walk 1097f without pain on the 6 minute walk test to indicate improve function by 08/26/15.    Time 8   Period Weeks   Status Not Met   PT LONG TERM GOAL #3   Title Patient will increase R LE strength to 5/5 to allow greater ease of ascent and descent of a flight of stairs without pain to access 2nd story of home by 08/26/15   Time 8   Period Weeks   Status Partially Met   PT LONG TERM GOAL #4   Title Patient will score less than a 25% on the Modified Oswestry to indicate improved function with daily tasks by 08/26/15   Time 8   Period Weeks   Status Not Met               Plan - 08/11/15 1135    Clinical Impression Statement Patient instructed in LE strengthening as well as core stabilization exercises. PT provided min verbal instruction to improve exercise technique including increased core activation, and improved ROM with therex. Patient demonstrated improved core activation and increased stability with therapy ball exercises. PT performed Manual therapy to improve spinal mobility and increase R sided pelvic rotation. Patient reports decreased pain following therapy, but states that he still feels a little stiff.  Continued skilled PT is recommended to improve LE strength and decrease low back pain to allow patient to return to PLOF.   Pt will benefit from skilled therapeutic intervention in order  to improve on the following deficits Difficulty walking;Decreased activity tolerance;Pain;Obesity;Postural dysfunction;Improper body mechanics;Decreased endurance;Hypomobility   Rehab Potential Good   Clinical Impairments Affecting Rehab Potential Positive: good results from prior PT. Negative: co-morbidities, chronic condition.    PT Frequency 2x / week   PT Duration 8 weeks   PT Treatment/Interventions ADLs/Self Care Home  Management;Gait training;Stair training;Functional mobility training;Therapeutic activities;Therapeutic exercise;Patient/family education;Manual techniques;Compression bandaging;Passive range of motion;Dry needling;Energy conservation;Traction;Moist Heat;Cryotherapy   PT Next Visit Plan LE strengthening, core stabilization.    PT Home Exercise Plan increased to include quadriped lumbar stretching.    Consulted and Agree with Plan of Care Patient        Problem List Patient Active Problem List   Diagnosis Date Noted  . History of basal cell cancer 06/11/2015  . Prostate cancer ([place]) 06/11/2015  . Sciatica of right side 06/11/2015  . Myasthenia gravis ([place]) 06/11/2015  . Benign essential HTN 02/09/2015  . Combined fat and carbohydrate induced hyperlipemia 12/30/2014  . Artificial cardiac pacemaker 12/30/2014  . Bradycardia 11/21/2014  . Breathlessness on exertion 11/21/2014  . 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 Carpal tunnel syndrome 05/08/2009  . Lumbago 01/09/2009  . 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 Hyperlipidemia 11/08/2006  . Obesity, morbid (more than 100 lbs over ideal weight or BMI > 40) ([place]) 07/23/2006  . Myasthenia gravis ([place]) 07/22/2003   Barrie Folk SPT 08/11/2015   4:47 PM  This entire session was performed under direct supervision and direction of a licensed therapist. I have personally read, edited and approve of the note as written.  Hopkins,Margaret PT, DPT 08/11/2015, 4:47 PM  [place] MAIN [facility] SERVICES [address] [place], [place], [address] Phone: [phone]   Fax:  [phone]

## 2015-08-13 ENCOUNTER — Ambulatory Visit: Payer: 59 | Admitting: Physical Therapy

## 2015-08-18 ENCOUNTER — Ambulatory Visit: Payer: 59 | Admitting: Physical Therapy

## 2015-08-19 ENCOUNTER — Ambulatory Visit (INDEPENDENT_AMBULATORY_CARE_PROVIDER_SITE_OTHER): Payer: Managed Care, Other (non HMO) | Admitting: Family Medicine

## 2015-08-19 ENCOUNTER — Encounter: Payer: Self-pay | Admitting: Family Medicine

## 2015-08-19 VITALS — BP 142/88 | HR 87 | Temp 98.4°F | Resp 16 | Wt 367.8 lb

## 2015-08-19 DIAGNOSIS — M5416 Radiculopathy, lumbar region: Secondary | ICD-10-CM

## 2015-08-19 DIAGNOSIS — M549 Dorsalgia, unspecified: Secondary | ICD-10-CM | POA: Insufficient documentation

## 2015-08-19 DIAGNOSIS — M5417 Radiculopathy, lumbosacral region: Secondary | ICD-10-CM

## 2015-08-19 MED ORDER — PREDNISONE 10 MG PO TABS: ORAL_TABLET | ORAL | Status: DC

## 2015-08-19 NOTE — Progress Notes (Signed)
Patient: Garrett Woods Male    DOB: 1956-01-02   59 y.o.   MRN: [id number] Visit Date: 08/19/2015  Today's Provider: Lelon Huh, MD   Chief Complaint  Patient presents with  . Back Pain   Subjective:    Back Pain This is a chronic problem. The current episode started more than 1 year ago. The problem occurs constantly. The problem has been gradually worsening since onset. The quality of the pain is described as aching, burning, cramping, shooting and stabbing. The pain radiates to the right foot and right thigh. The pain is at a severity of 5/10 (on medicine). The pain is moderate. Worse during: usually when walking. The symptoms are aggravated by standing. Stiffness is present all day. Associated symptoms include leg pain (pain radiates to the right leg), numbness (right foot) and tingling. Pertinent negatives include no weakness. Risk factors include obesity. He has tried muscle relaxant and heat (anti inflammatory) for the symptoms. The treatment provided no relief.   Was initially seen for this August 10th and prescribed prednisone, flexeril, and physical therapy. He states he had small improvement with these treatments, but pain remains moderate in intensity and is radiating all the way down the back and side of right leg. Not feeling week in leg, but mobility is limited due to pain.   Wt Readings from Last 3 Encounters:  08/19/15 367 lb 12.8 oz (166.833 kg)  07/09/15 359 lb 5.6 oz (163 kg)  06/11/15 362 lb (164.202 kg)       No Known Allergies Previous Medications   ASPIRIN EC 81 MG TABLET    Take 81 mg by mouth daily.   CYCLOBENZAPRINE (FLEXERIL) 5 MG TABLET    TAKE 1-2 TABLETS EVERY EIGHT HOURS AS NEEDED FOR BACK PAIN   DICLOFENAC (CATAFLAM) 50 MG TABLET       DICLOFENAC (VOLTAREN) 50 MG EC TABLET    TAKE 1 TABLET BY MOUTH TWICE A DAY AS NEEDED FOR BACK PAIN   GLUCOSAMINE-CHONDROITIN 250-200 MG TABS    Take 1 tablet by mouth daily.   LOSARTAN-HYDROCHLOROTHIAZIDE (HYZAAR) 100-25 MG PER TABLET    TAKE 1 TABLET BY MOUTH DAILY   LOVASTATIN (MEVACOR) 40 MG TABLET    Take 40 mg by mouth at bedtime.    Review of Systems  Musculoskeletal: Positive for back pain. Negative for joint swelling, arthralgias and neck stiffness.  Neurological: Positive for tingling and numbness (right foot). Negative for dizziness, tremors and weakness.    Social History  Substance Use Topics  . Smoking status: Former Smoker -- 1.00 packs/day for 20 years    Types: Cigarettes    Quit date: 11/02/2003  . Smokeless tobacco: Not on file  . Alcohol Use: 0.0 oz/week    0 Standard drinks or equivalent per week     Comment: 2 drinks every weekend   Objective:   BP 142/88 mmHg  Pulse 87  Temp(Src) 98.4 F (36.9 C) (Oral)  Resp 16  Wt 367 lb 12.8 oz (166.833 kg)  Physical Exam  General appearance: alert, well developed, well nourished, cooperative and in no distress, obese Head: Normocephalic, without obvious abnormality, atraumatic Lungs: Respirations even and unlabored Extremities: No gross deformities Skin: Skin color, texture, turgor normal. No rashes seen  Psych: Appropriate mood and affect. Neurologic: Mental status: Alert, oriented to person, place, and time, thought content appropriate. Right patella tendon reflex absent. Left +1. MS RLE +4. LLE +5.     Assessment & Plan:  1. Lumbar back pain with radiculopathy affecting right lower extremity Not improved after completing physical therapy. There was a bit of improvement on prednisone which we will restart while awaiting MRI results.  - diclofenac (CATAFLAM) 50 MG tablet;  - predniSONE (DELTASONE) 10 MG tablet; 6 tablets for 2 days, then 5 for 2 days, then 4 for 2 days, then 3 for 2 days, then 2 for 2 days, then 1 for 2 days.  Dispense: 42 tablet; Refill: 0 - MR Lumbar Spine Wo Contrast; Future       Lelon Huh, MD  [facility]

## 2015-08-20 ENCOUNTER — Encounter: Payer: 59 | Admitting: Physical Therapy

## 2015-08-25 ENCOUNTER — Encounter: Payer: 59 | Admitting: Physical Therapy

## 2015-08-27 ENCOUNTER — Encounter: Payer: 59 | Admitting: Physical Therapy

## 2015-08-29 ENCOUNTER — Telehealth: Payer: Self-pay

## 2015-08-29 ENCOUNTER — Other Ambulatory Visit: Payer: Self-pay | Admitting: Family Medicine

## 2015-08-29 DIAGNOSIS — M5416 Radiculopathy, lumbar region: Secondary | ICD-10-CM

## 2015-08-29 DIAGNOSIS — M48061 Spinal stenosis, lumbar region without neurogenic claudication: Secondary | ICD-10-CM

## 2015-08-29 MED ORDER — PREDNISONE 10 MG PO TABS: ORAL_TABLET | ORAL | Status: AC

## 2015-08-29 NOTE — Telephone Encounter (Signed)
-----   Message from Birdie Sons, MD sent at 08/29/2015  8:06 AM EDT ----- Regarding: MRI Spine MRI shows several bulging discs and severe spine canal narrowing which is probably compressing nerves causing his pain and numbness. Need to stay on prednisone and need referral to neurosurgery. Order is in EMR for referral. Please forward to Judson Roch after advising patient.

## 2015-08-29 NOTE — Telephone Encounter (Signed)
Patient advised as below and agrees with referral and continuing Prednisone. Patient states he will be out of the Prednisone on Sunday. He wants to know if he needs another round of prednisone sent into the pharmacy? (This message has not been forwarded to Judson Roch yet.)

## 2015-08-29 NOTE — Telephone Encounter (Signed)
Have sent refill to pharmacy that should get him by until seen by back specialist.

## 2015-08-29 NOTE — Telephone Encounter (Signed)
Pt is returning call.  CB#[phone]/MW

## 2015-08-29 NOTE — Telephone Encounter (Signed)
Patient advised that refill for prednisone has been sent into pharmacy. Please put in order for referral as stated below and then forward to [place]. Thanks

## 2015-08-29 NOTE — Telephone Encounter (Signed)
[id number] 

## 2015-09-03 ENCOUNTER — Encounter: Payer: Self-pay | Admitting: Family Medicine

## 2015-11-02 ENCOUNTER — Other Ambulatory Visit: Payer: Self-pay | Admitting: Family Medicine

## 2015-11-12 ENCOUNTER — Ambulatory Visit
Admission: RE | Admit: 2015-11-12 | Discharge: 2015-11-12 | Disposition: A | Discharge status: Home / Self Care | Payer: Managed Care, Other (non HMO) | Source: Ambulatory Visit | Attending: Radiation Oncology | Admitting: Radiation Oncology

## 2015-11-12 ENCOUNTER — Encounter: Payer: Self-pay | Admitting: Radiation Oncology

## 2015-11-12 ENCOUNTER — Other Ambulatory Visit: Payer: Self-pay | Admitting: *Deleted

## 2015-11-12 ENCOUNTER — Inpatient Hospital Stay: Payer: Managed Care, Other (non HMO) | Attending: Radiation Oncology

## 2015-11-12 VITALS — BP 149/93 | HR 95 | Temp 99.1°F | Resp 20 | Wt 370.4 lb

## 2015-11-12 DIAGNOSIS — C61 Malignant neoplasm of prostate: Secondary | ICD-10-CM | POA: Insufficient documentation

## 2015-11-12 LAB — PSA: PSA: 0.97 ng/mL (ref 0.00–4.00)

## 2015-11-12 NOTE — Progress Notes (Signed)
Radiation Oncology Follow up Note  Name: Garrett Woods   Date:   11/12/2015 MRN:  [id number] DOB: 1956/03/20    This 60 y.o. male presents to the clinic today for follow-up for prostate cancer Gleason 6 (3+3) status post image guided I MRT radiation therapy.  REFERRING PROVIDER: Birdie Sons, MD  HPI: Patient is an 60 year old male now out 5 months having completed image guided I MRT radiation therapy for Gleason 6 adenocarcinoma the prostate presenting the PSA of 5. He had a 72 g prostate over the limit for seed implantation.Marland Kitchen He is seen today in routine follow-up and is doing well specifically denies any lower urinary tract symptoms diarrhea or fatigue. He has PSA drawn prior to examination. Has not had one since completing radiation  COMPLICATIONS OF TREATMENT: none  FOLLOW UP COMPLIANCE: keeps appointments   PHYSICAL EXAM:  BP 149/93 mmHg  Pulse 95  Temp(Src) 99.1 F (37.3 C)  Resp 20  Wt 370 lb 6 oz (168 kg) On rectal exam rectal sphincter tone is good. Prostate is smooth contracted without evidence of nodularity or mass. Sulcus is preserved bilaterally. No discrete nodularity is identified. No other rectal abnormalities are noted. Well-developed well-nourished patient in NAD. HEENT reveals PERLA, EOMI, discs not visualized.  Oral cavity is clear. No oral mucosal lesions are identified. Neck is clear without evidence of cervical or supraclavicular adenopathy. Lungs are clear to A&P. Cardiac examination is essentially unremarkable with regular rate and rhythm without murmur rub or thrill. Abdomen is benign with no organomegaly or masses noted. Motor sensory and DTR levels are equal and symmetric in the upper and lower extremities. Cranial nerves II through XII are grossly intact. Proprioception is intact. No peripheral adenopathy or edema is identified. No motor or sensory levels are noted. Crude visual fields are within normal range.  RADIOLOGY RESULTS: No current films  for review  PLAN: At the present time he is doing well. Will review his PSA when it becomes available. Otherwise I'm please was overall progress. I've asked to see him back in 6 months for follow-up. Patient knows to call sooner with any concerns.  I would like to take this opportunity for allowing me to participate in the care of your patient.Armstead Peaks., MD

## 2015-11-24 ENCOUNTER — Other Ambulatory Visit: Payer: Self-pay | Admitting: Neurosurgery

## 2015-11-24 DIAGNOSIS — M48061 Spinal stenosis, lumbar region without neurogenic claudication: Secondary | ICD-10-CM

## 2015-12-01 ENCOUNTER — Ambulatory Visit
Admission: RE | Admit: 2015-12-01 | Discharge: 2015-12-01 | Disposition: A | Discharge status: Home / Self Care | Payer: Managed Care, Other (non HMO) | Source: Ambulatory Visit | Attending: Neurosurgery | Admitting: Neurosurgery

## 2015-12-01 DIAGNOSIS — M48061 Spinal stenosis, lumbar region without neurogenic claudication: Secondary | ICD-10-CM

## 2015-12-01 MED ORDER — METHYLPREDNISOLONE ACETATE 40 MG/ML INJ SUSP (RADIOLOG
120.0000 mg | Freq: Once | INTRAMUSCULAR | Status: AC
  Administered 2015-12-01: 120 mg via EPIDURAL

## 2015-12-01 MED ORDER — IOHEXOL 180 MG/ML  SOLN
1.0000 mL | Freq: Once | INTRAMUSCULAR | Status: AC | PRN
  Administered 2015-12-01: 1 mL via EPIDURAL

## 2015-12-01 NOTE — Discharge Instructions (Signed)

## 2015-12-07 ENCOUNTER — Other Ambulatory Visit: Payer: Self-pay | Admitting: Family Medicine

## 2015-12-19 ENCOUNTER — Other Ambulatory Visit: Payer: Self-pay | Admitting: Neurosurgery

## 2015-12-19 DIAGNOSIS — M48061 Spinal stenosis, lumbar region without neurogenic claudication: Secondary | ICD-10-CM

## 2016-01-01 ENCOUNTER — Other Ambulatory Visit: Payer: Managed Care, Other (non HMO)

## 2016-01-03 ENCOUNTER — Other Ambulatory Visit: Payer: Self-pay | Admitting: Family Medicine

## 2016-01-09 ENCOUNTER — Encounter: Payer: Self-pay | Admitting: Family Medicine

## 2016-01-15 ENCOUNTER — Other Ambulatory Visit: Payer: Managed Care, Other (non HMO)

## 2016-03-23 ENCOUNTER — Other Ambulatory Visit (HOSPITAL_COMMUNITY): Payer: Self-pay | Admitting: Neurosurgery

## 2016-03-23 DIAGNOSIS — M5416 Radiculopathy, lumbar region: Secondary | ICD-10-CM

## 2016-04-01 ENCOUNTER — Other Ambulatory Visit (HOSPITAL_COMMUNITY): Payer: Managed Care, Other (non HMO)

## 2016-04-05 ENCOUNTER — Other Ambulatory Visit: Payer: Self-pay | Admitting: Radiology

## 2016-04-08 ENCOUNTER — Ambulatory Visit (HOSPITAL_COMMUNITY)
Admission: RE | Admit: 2016-04-08 | Discharge: 2016-04-08 | Disposition: A | Discharge status: Home / Self Care | Payer: Managed Care, Other (non HMO) | Source: Ambulatory Visit | Attending: Neurosurgery | Admitting: Neurosurgery

## 2016-04-08 DIAGNOSIS — M5416 Radiculopathy, lumbar region: Secondary | ICD-10-CM | POA: Diagnosis not present

## 2016-04-08 DIAGNOSIS — M4806 Spinal stenosis, lumbar region: Secondary | ICD-10-CM | POA: Diagnosis not present

## 2016-04-08 DIAGNOSIS — M4807 Spinal stenosis, lumbosacral region: Secondary | ICD-10-CM | POA: Diagnosis not present

## 2016-04-08 MED ORDER — LIDOCAINE HCL (PF) 1 % IJ SOLN
5.0000 mL | Freq: Once | INTRAMUSCULAR | Status: AC
  Administered 2016-04-08: 5 mL via INTRADERMAL

## 2016-04-08 MED ORDER — IOPAMIDOL (ISOVUE-M 200) INJECTION 41%
INTRAMUSCULAR | Status: AC
  Administered 2016-04-08: 20 mL via INTRATHECAL
  Filled 2016-04-08: qty 10

## 2016-04-08 MED ORDER — ACETAMINOPHEN 325 MG PO TABS
ORAL_TABLET | ORAL | Status: AC
  Administered 2016-04-08: 650 mg
  Filled 2016-04-08: qty 2

## 2016-04-08 MED ORDER — IOPAMIDOL (ISOVUE-M 200) INJECTION 41%
20.0000 mL | Freq: Once | INTRAMUSCULAR | Status: AC
  Administered 2016-04-08: 20 mL via INTRATHECAL

## 2016-04-08 MED ORDER — LIDOCAINE HCL (PF) 1 % IJ SOLN
INTRAMUSCULAR | Status: AC
  Administered 2016-04-08: 5 mL via INTRADERMAL
  Filled 2016-04-08: qty 5

## 2016-04-08 MED ORDER — ONDANSETRON HCL 4 MG/2ML IJ SOLN: 4.0000 mg | Freq: Four times a day (QID) | INTRAMUSCULAR | Status: DC | PRN

## 2016-04-08 MED ORDER — ACETAMINOPHEN 325 MG PO TABS
650.0000 mg | ORAL_TABLET | Freq: Four times a day (QID) | ORAL | Status: DC | PRN
  Filled 2016-04-08: qty 2

## 2016-04-08 NOTE — Discharge Instructions (Signed)
Myelography, Care After °These instructions give you information on caring for yourself after your procedure. Your doctor may also give you more specific instructions. Call your doctor if you have any problems or questions after your procedure. °HOME CARE °· Rest the first day. °· When you rest, lie flat, with your head slightly raised (elevated). °· Avoid heavy lifting and activity for 48 hours, or as told by your doctor. °· You may take the bandage (dressing) off one day after the test, or as told by your doctor. °· Take all medicines only as told by your doctor. °· Ask your doctor when it is okay to take a shower or bath. °· Ask your doctor when your test results will be ready and how you can get them. Make sure you follow up and get your results. °· Do not drink alcohol for 24 hours, or as told by your doctor. °· Drink enough fluid to keep your pee (urine) clear or pale yellow. °GET HELP IF:  °· You have a fever. °· You have a headache. °· You feel sick to your stomach (nauseous) or throw up (vomit). °· You have pain or cramping in your belly (abdomen). °GET HELP RIGHT AWAY IF:  °· You have a headache with a stiff neck or fever. °· You have trouble breathing. °· Any of the places where the needles were put in are: °¨ Puffy (swollen) or red. °¨ Sore or hot to the touch. °¨ Draining yellowish-white fluid (pus). °¨ Bleeding. °MAKE SURE YOU: °· Understand these instructions. °· Will watch your condition. °· Will get help right away if you are not doing well or get worse. °  °This information is not intended to replace advice given to you by your health care provider. Make sure you discuss any questions you have with your health care provider. °  °Document Released: 07/27/2008 Document Revised: 11/08/2014 Document Reviewed: 07/12/2012 °Elsevier Interactive Patient Education ©2016 Elsevier Inc. ° °

## 2016-04-08 NOTE — CV Procedure (Signed)
Informed consent obtained.  Time out performed. L2-3 LP performed under sterile technique with single pass 20 g spinal needle.  16 cc Isovue 200 instilled.  Lumbar myelogram performed without difficulty.  CT to follow.

## 2016-05-06 ENCOUNTER — Other Ambulatory Visit: Payer: Self-pay | Admitting: Neurosurgery

## 2016-05-11 ENCOUNTER — Other Ambulatory Visit: Payer: Self-pay | Admitting: *Deleted

## 2016-05-11 DIAGNOSIS — C61 Malignant neoplasm of prostate: Secondary | ICD-10-CM

## 2016-05-12 ENCOUNTER — Encounter: Payer: Self-pay | Admitting: Radiation Oncology

## 2016-05-12 ENCOUNTER — Other Ambulatory Visit: Payer: Self-pay | Admitting: *Deleted

## 2016-05-12 ENCOUNTER — Ambulatory Visit
Admission: RE | Admit: 2016-05-12 | Discharge: 2016-05-12 | Disposition: A | Discharge status: Home / Self Care | Payer: Managed Care, Other (non HMO) | Source: Ambulatory Visit | Attending: Radiation Oncology | Admitting: Radiation Oncology

## 2016-05-12 ENCOUNTER — Inpatient Hospital Stay: Payer: Managed Care, Other (non HMO) | Attending: Radiation Oncology

## 2016-05-12 VITALS — BP 159/91 | HR 85 | Temp 97.8°F | Resp 22 | Wt 371.3 lb

## 2016-05-12 DIAGNOSIS — C61 Malignant neoplasm of prostate: Secondary | ICD-10-CM | POA: Insufficient documentation

## 2016-05-12 DIAGNOSIS — Z923 Personal history of irradiation: Secondary | ICD-10-CM | POA: Insufficient documentation

## 2016-05-12 LAB — PSA: PSA: 0.77 ng/mL (ref 0.00–4.00)

## 2016-05-12 NOTE — Progress Notes (Signed)
Radiation Oncology Follow up Note  Name: Garrett Woods   Date:   05/12/2016 MRN:  [id number] DOB: [birth year]    This 60 y.o. male presents to the clinic today for follow-up for Gleason 6 prostate cancer status post image guided I MRT now out 11 months.  REFERRING PROVIDER: Birdie Sons, MD  HPI: Patient is a 60 year old male now out 11 months having completed IM RT radiation therapy for Gleason 6 (3+3) adenocarcinoma presenting the PSA of 5 with a 72 g prostate. Seen today in routine follow-up he is doing well. His last PSA back in January 2017 was 1. He specifically denies diarrhea dysuria or any other GI/GU complaints.. Patient is slated to have back surgery for lumbar disc condition in the near future.  COMPLICATIONS OF TREATMENT: none  FOLLOW UP COMPLIANCE: keeps appointments   PHYSICAL EXAM:  BP 159/91 mmHg  Pulse 85  Temp(Src) 97.8 F (36.6 C)  Resp 22  Wt 371 lb 4.1 oz (168.4 kg) On rectal exam rectal sphincter tone is good. Prostate is smooth contracted without evidence of nodularity or mass. Sulcus is preserved bilaterally. No discrete nodularity is identified. No other rectal abnormalities are noted. Well-developed well-nourished patient in NAD. HEENT reveals PERLA, EOMI, discs not visualized.  Oral cavity is clear. No oral mucosal lesions are identified. Neck is clear without evidence of cervical or supraclavicular adenopathy. Lungs are clear to A&P. Cardiac examination is essentially unremarkable with regular rate and rhythm without murmur rub or thrill. Abdomen is benign with no organomegaly or masses noted. Motor sensory and DTR levels are equal and symmetric in the upper and lower extremities. Cranial nerves II through XII are grossly intact. Proprioception is intact. No peripheral adenopathy or edema is identified. No motor or sensory levels are noted. Crude visual fields are within normal range.  RADIOLOGY RESULTS: No current films for review  PLAN: Present  time I have run a PSA level on him today and will repeat that separately. I'm otherwise please was overall progress and side effect profile. I've asked to see him back in 6 months for follow-up and then will start once your follow-up visits. Patient knows to call sooner with any concerns.  I would like to take this opportunity to thank you for allowing me to participate in the care of your patient.Armstead Peaks., MD

## 2016-05-17 ENCOUNTER — Other Ambulatory Visit: Payer: Self-pay | Admitting: *Deleted

## 2016-05-17 DIAGNOSIS — C61 Malignant neoplasm of prostate: Secondary | ICD-10-CM

## 2016-05-23 ENCOUNTER — Other Ambulatory Visit: Payer: Self-pay | Admitting: Family Medicine

## 2016-05-24 NOTE — Pre-Procedure Instructions (Signed)
Garrett Woods  05/24/2016     Your procedure is scheduled on : Wednesday June 02, 2016 at 11:30 AM.  Report to [facility] at 8:30 AM.  Call this number if you have problems the morning of surgery: [phone]    Remember:  Do not eat food or drink liquids after midnight.  Take these medicines the morning of surgery with A SIP OF WATER : Gabapentin (Neurontin)   Stop taking any vitamins, herbal medications/supplements, NSAIDs, Ibuprofen, Advil, Motrin, Aleve, Diclofenac, etc on Wednesday July 26th   Do not wear jewelry.  Do not wear lotions, powders, or cologne.    Men may shave face and neck.  Do not bring valuables to the hospital.  [facility] is not responsible for any belongings or valuables.  Contacts, dentures or bridgework may not be worn into surgery.  Leave your suitcase in the car.  After surgery it may be brought to your room.  For patients admitted to the hospital, discharge time will be determined by your treatment team.  Patients discharged the day of surgery will not be allowed to drive home.   Name and phone number of your driver:    Special instructions:  Shower using CHG soap the night before and the morning of your surgery  Please read over the following fact sheets that you were given. MRSA Information

## 2016-05-25 ENCOUNTER — Encounter (HOSPITAL_COMMUNITY)
Admission: RE | Admit: 2016-05-25 | Discharge: 2016-05-25 | Disposition: A | Discharge status: Home / Self Care | Payer: Managed Care, Other (non HMO) | Source: Ambulatory Visit | Attending: Neurosurgery | Admitting: Neurosurgery

## 2016-05-25 ENCOUNTER — Encounter (HOSPITAL_COMMUNITY): Payer: Self-pay

## 2016-05-25 DIAGNOSIS — Z85820 Personal history of malignant melanoma of skin: Secondary | ICD-10-CM | POA: Insufficient documentation

## 2016-05-25 DIAGNOSIS — Z79899 Other long term (current) drug therapy: Secondary | ICD-10-CM | POA: Insufficient documentation

## 2016-05-25 DIAGNOSIS — Z01812 Encounter for preprocedural laboratory examination: Secondary | ICD-10-CM | POA: Diagnosis not present

## 2016-05-25 DIAGNOSIS — Z0183 Encounter for blood typing: Secondary | ICD-10-CM | POA: Insufficient documentation

## 2016-05-25 DIAGNOSIS — Z8546 Personal history of malignant neoplasm of prostate: Secondary | ICD-10-CM | POA: Diagnosis not present

## 2016-05-25 DIAGNOSIS — Z87891 Personal history of nicotine dependence: Secondary | ICD-10-CM | POA: Diagnosis not present

## 2016-05-25 DIAGNOSIS — M4806 Spinal stenosis, lumbar region: Secondary | ICD-10-CM | POA: Diagnosis not present

## 2016-05-25 DIAGNOSIS — Z01818 Encounter for other preprocedural examination: Secondary | ICD-10-CM | POA: Diagnosis present

## 2016-05-25 DIAGNOSIS — G7000 Myasthenia gravis without (acute) exacerbation: Secondary | ICD-10-CM | POA: Insufficient documentation

## 2016-05-25 DIAGNOSIS — I10 Essential (primary) hypertension: Secondary | ICD-10-CM | POA: Insufficient documentation

## 2016-05-25 DIAGNOSIS — Z923 Personal history of irradiation: Secondary | ICD-10-CM | POA: Insufficient documentation

## 2016-05-25 DIAGNOSIS — Z85828 Personal history of other malignant neoplasm of skin: Secondary | ICD-10-CM | POA: Diagnosis not present

## 2016-05-25 DIAGNOSIS — E785 Hyperlipidemia, unspecified: Secondary | ICD-10-CM | POA: Diagnosis not present

## 2016-05-25 DIAGNOSIS — Z950 Presence of cardiac pacemaker: Secondary | ICD-10-CM | POA: Diagnosis not present

## 2016-05-25 DIAGNOSIS — Z7982 Long term (current) use of aspirin: Secondary | ICD-10-CM | POA: Insufficient documentation

## 2016-05-25 HISTORY — DX: Myasthenia gravis without (acute) exacerbation: G70.00

## 2016-05-25 LAB — BASIC METABOLIC PANEL
Anion gap: 9 (ref 5–15)
BUN: 11 mg/dL (ref 6–20)
CHLORIDE: 104 mmol/L (ref 101–111)
CO2: 25 mmol/L (ref 22–32)
Calcium: 9.8 mg/dL (ref 8.9–10.3)
Creatinine, Ser: 1.06 mg/dL (ref 0.61–1.24)
GFR calc Af Amer: >60 mL/min (ref >60)
GFR calc non Af Amer: >60 mL/min (ref >60)
GLUCOSE: 164 mg/dL — AB (ref 65–99)
POTASSIUM: 3.7 mmol/L (ref 3.5–5.1)
Sodium: 138 mmol/L (ref 135–145)

## 2016-05-25 LAB — SURGICAL PCR SCREEN
MRSA, PCR: NEGATIVE
Staphylococcus aureus: POSITIVE — AB

## 2016-05-25 LAB — TYPE AND SCREEN
ABO/RH(D): O POS
Antibody Screen: NEGATIVE

## 2016-05-25 LAB — CBC
HEMATOCRIT: 39.2 % (ref 39.0–52.0)
HEMOGLOBIN: 13 g/dL (ref 13.0–17.0)
MCH: 31.4 pg (ref 26.0–34.0)
MCHC: 33.2 g/dL (ref 30.0–36.0)
MCV: 94.7 fL (ref 78.0–100.0)
PLATELETS: 203 10*3/uL (ref 150–400)
RBC: 4.14 MIL/uL — AB (ref 4.22–5.81)
RDW: 13.4 % (ref 11.5–15.5)
WBC: 7.9 10*3/uL (ref 4.0–10.5)

## 2016-05-25 LAB — ABO/RH: ABO/RH(D): O POS

## 2016-05-25 MED ORDER — CHLORHEXIDINE GLUCONATE CLOTH 2 % EX PADS: 6.0000 | MEDICATED_PAD | Freq: Once | CUTANEOUS | Status: DC

## 2016-05-25 NOTE — Progress Notes (Signed)
   05/25/16 0819  OBSTRUCTIVE SLEEP APNEA  Have you ever been diagnosed with sleep apnea through a sleep study? No  Do you snore loudly (loud enough to be heard through closed doors)?  1  Do you often feel tired, fatigued, or sleepy during the daytime (such as falling asleep during driving or talking to someone)? 0  Has anyone observed you stop breathing during your sleep? 0  Do you have, or are you being treated for high blood pressure? 1  BMI more than 35 kg/m2? 1  Age > 50 (1-yes) 1  Neck circumference greater than:Male 16 inches or larger, Male 17inches or larger? 1  Male Gender (Yes=1) 1  Obstructive Sleep Apnea Score 6  Score 5 or greater  Results sent to PCP   This patient has screened at risk for sleep apnea using the STOP Bang tool used during a pre-surgical visit. A score of 5 or greater is at risk for sleep apnea.

## 2016-05-25 NOTE — Progress Notes (Signed)
Nurse called prescription for Mupirocin into [facility] pharmacy in [place], [place]. Then Nurse called patient and left a voicemail instructing patient to pick up ointment and begin it at his earliest convenience. Direct call back number left for patient.

## 2016-05-25 NOTE — Progress Notes (Signed)
Nurse called Tomi Bamberger ([employer]) and informed her of patients surgery, date, and time. Tomi Bamberger verbalized understanding.

## 2016-05-25 NOTE — Progress Notes (Signed)
PCP is Lelon Huh  Cardiologist is Serafina Royals. LOV was 03/25/16  Patient denied having any acute cardiac or pulmonary issues, and informed Nurse that he had a [employer] pacemaker implanted in February of 2016, and that he also had a stress test, EKG, and Echocardiogram.   Perioperative device programming sheet faxed to Dr. Nehemiah Massed. Awaiting results.  Will send chart to Anesthesia for review.

## 2016-05-27 NOTE — Progress Notes (Signed)
Anesthesia Chart Review: Patient is a 60 year old male scheduled for L3-4, L4-5, L5-S1 PLIF on 06/02/16 by Dr. Arnoldo Morale.  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 OSA screening score was 6. PCP is Dr. Lelon Huh.  Cardiologist is Dr. Serafina Royals, last visit 03/25/16. He wrote, "Proceed to surgery and/or invasive procedure without restriction to pre or post operative and/or procedural care. The patient is at lowest risk possible for cardiovascular complications with surgical intervention and/or invasive procedure. Currently has no evidence active and/or significant angina and/or congestive heart failure. The patient may discontinue aspirin 7 days prior to procedure and restart at a safe period thereafter."  Meds include ASA 81mg , Neurontin, losartan-HCTZ, lovastatin.  PAT Vitals: BP 151/89, HR 101, RR 20, T 36.8C, O2 sat 95%. WT 168.6 Kg. BMI 50.40.  03/25/16 EKG: Atrial sensed ventricular paced rhythm.  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 Nehemiah Massed. Actual report requested.)  11/2014 24/48 hour Holter Monitor: Baseline normal sinus rhythm with maximum heart rate of 71 beats per min minimum  of 43 beats per min average of 53 beats per min there is occasional pre atrial and preventricular contractions. There was some periods of bradycardia but no evidence of significant heart block or other malignant rhythms. There was no diary entry  08/28/15 CXR ([facility]; Care Everywhere): Impression: Left-sided pacemaker with intact leads projecting over the right atrium and right ventricle. Otherwise normal radiograph of the chest.  Preoperative labs noted. T&S done.   Chart will be left for nursing follow-up regarding PPM perioperative RX and 2016 stress report from [facility]. If no acute changes then I anticipate that he can proceed as planned.  George Hugh [facility] Short Stay Center/Anesthesiology Phone [phone] 05/27/2016 6:48 PM

## 2016-06-01 HISTORY — PX: BACK SURGERY: SHX140

## 2016-06-01 MED ORDER — DEXTROSE 5 % IV SOLN
3.0000 g | INTRAVENOUS | Status: AC
  Administered 2016-06-02 (×2): 3 g via INTRAVENOUS
  Filled 2016-06-01: qty 3000

## 2016-06-01 NOTE — Anesthesia Preprocedure Evaluation (Addendum)
Anesthesia Evaluation  Patient identified by MRN, date of birth, ID band Patient awake    Reviewed: Allergy & Precautions, H&P , Patient's Chart, lab work & pertinent test results, reviewed documented beta blocker date and time   Airway Mallampati: IV  TM Distance: >3 FB Neck ROM: full    Dental no notable dental hx.    Pulmonary shortness of breath, former smoker,    Pulmonary exam normal breath sounds clear to auscultation       Cardiovascular hypertension, + pacemaker  Rhythm:regular Rate:Normal     Neuro/Psych  Neuromuscular disease    GI/Hepatic   Endo/Other  Morbid obesity  Renal/GU      Musculoskeletal   Abdominal   Peds  Hematology   Anesthesia Other Findings   Reproductive/Obstetrics                           Anesthesia Physical Anesthesia Plan  ASA: III  Anesthesia Plan: General   Post-op Pain Management:    Induction: Intravenous  Airway Management Planned: Oral ETT  Additional Equipment:   Intra-op Plan:   Post-operative Plan: Extubation in OR  Informed Consent: I have reviewed the patients History and Physical, chart, labs and discussed the procedure including the risks, benefits and alternatives for the proposed anesthesia with the patient or authorized representative who has indicated his/her understanding and acceptance.   Dental Advisory Given and Dental advisory given  Plan Discussed with: CRNA and Surgeon  Anesthesia Plan Comments: (  Discussed general anesthesia, including possible nausea, instrumentation of airway, sore throat,pulmonary aspiration, etc. I asked if the were any outstanding questions, or  concerns before we proceeded. )        Anesthesia Quick Evaluation

## 2016-06-02 ENCOUNTER — Encounter (HOSPITAL_COMMUNITY)
Admission: RE | Disposition: A | Discharge status: Home Health | Payer: Self-pay | Source: Ambulatory Visit | Attending: Neurosurgery

## 2016-06-02 ENCOUNTER — Inpatient Hospital Stay (HOSPITAL_COMMUNITY): Payer: Managed Care, Other (non HMO)

## 2016-06-02 ENCOUNTER — Encounter (HOSPITAL_COMMUNITY): Payer: Self-pay | Admitting: *Deleted

## 2016-06-02 ENCOUNTER — Inpatient Hospital Stay (HOSPITAL_COMMUNITY)
Admission: RE | Admit: 2016-06-02 | Discharge: 2016-06-05 | DRG: 460 | Disposition: A | Discharge status: Home Health | Payer: Managed Care, Other (non HMO) | Source: Ambulatory Visit | Attending: Neurosurgery | Admitting: Neurosurgery

## 2016-06-02 ENCOUNTER — Inpatient Hospital Stay (HOSPITAL_COMMUNITY): Payer: Managed Care, Other (non HMO) | Admitting: Vascular Surgery

## 2016-06-02 ENCOUNTER — Inpatient Hospital Stay (HOSPITAL_COMMUNITY): Payer: Managed Care, Other (non HMO) | Admitting: Anesthesiology

## 2016-06-02 DIAGNOSIS — Z8249 Family history of ischemic heart disease and other diseases of the circulatory system: Secondary | ICD-10-CM

## 2016-06-02 DIAGNOSIS — M5116 Intervertebral disc disorders with radiculopathy, lumbar region: Secondary | ICD-10-CM | POA: Diagnosis present

## 2016-06-02 DIAGNOSIS — I10 Essential (primary) hypertension: Secondary | ICD-10-CM | POA: Diagnosis present

## 2016-06-02 DIAGNOSIS — Z950 Presence of cardiac pacemaker: Secondary | ICD-10-CM

## 2016-06-02 DIAGNOSIS — Z85820 Personal history of malignant melanoma of skin: Secondary | ICD-10-CM | POA: Diagnosis not present

## 2016-06-02 DIAGNOSIS — Z8546 Personal history of malignant neoplasm of prostate: Secondary | ICD-10-CM

## 2016-06-02 DIAGNOSIS — M109 Gout, unspecified: Secondary | ICD-10-CM | POA: Diagnosis present

## 2016-06-02 DIAGNOSIS — Z923 Personal history of irradiation: Secondary | ICD-10-CM

## 2016-06-02 DIAGNOSIS — Z419 Encounter for procedure for purposes other than remedying health state, unspecified: Secondary | ICD-10-CM

## 2016-06-02 DIAGNOSIS — Z7982 Long term (current) use of aspirin: Secondary | ICD-10-CM

## 2016-06-02 DIAGNOSIS — Z87891 Personal history of nicotine dependence: Secondary | ICD-10-CM

## 2016-06-02 DIAGNOSIS — M4806 Spinal stenosis, lumbar region: Principal | ICD-10-CM | POA: Diagnosis present

## 2016-06-02 DIAGNOSIS — M4316 Spondylolisthesis, lumbar region: Secondary | ICD-10-CM | POA: Diagnosis present

## 2016-06-02 DIAGNOSIS — M4807 Spinal stenosis, lumbosacral region: Secondary | ICD-10-CM | POA: Diagnosis present

## 2016-06-02 DIAGNOSIS — M4317 Spondylolisthesis, lumbosacral region: Secondary | ICD-10-CM | POA: Diagnosis present

## 2016-06-02 DIAGNOSIS — M48062 Spinal stenosis, lumbar region with neurogenic claudication: Secondary | ICD-10-CM | POA: Diagnosis present

## 2016-06-02 DIAGNOSIS — G7000 Myasthenia gravis without (acute) exacerbation: Secondary | ICD-10-CM | POA: Diagnosis present

## 2016-06-02 DIAGNOSIS — E785 Hyperlipidemia, unspecified: Secondary | ICD-10-CM | POA: Diagnosis present

## 2016-06-02 SURGERY — POSTERIOR LUMBAR FUSION 3 LEVEL
Anesthesia: General

## 2016-06-02 MED ORDER — LIDOCAINE 2% (20 MG/ML) 5 ML SYRINGE
INTRAMUSCULAR | Status: AC
  Filled 2016-06-02: qty 5

## 2016-06-02 MED ORDER — LACTATED RINGERS IV SOLN
INTRAVENOUS | Status: DC
  Administered 2016-06-02 (×3): via INTRAVENOUS

## 2016-06-02 MED ORDER — 0.9 % SODIUM CHLORIDE (POUR BTL) OPTIME
TOPICAL | Status: DC | PRN
  Administered 2016-06-02: 1000 mL

## 2016-06-02 MED ORDER — BACITRACIN ZINC 500 UNIT/GM EX OINT
TOPICAL_OINTMENT | CUTANEOUS | Status: DC | PRN
  Administered 2016-06-02: 1 via TOPICAL

## 2016-06-02 MED ORDER — FENTANYL CITRATE (PF) 250 MCG/5ML IJ SOLN
INTRAMUSCULAR | Status: AC
  Filled 2016-06-02: qty 5

## 2016-06-02 MED ORDER — OXYCODONE-ACETAMINOPHEN 5-325 MG PO TABS
ORAL_TABLET | ORAL | Status: AC
  Filled 2016-06-02: qty 2

## 2016-06-02 MED ORDER — LOSARTAN POTASSIUM-HCTZ 100-25 MG PO TABS: 1.0000 | ORAL_TABLET | Freq: Every day | ORAL | Status: DC

## 2016-06-02 MED ORDER — EPHEDRINE SULFATE-NACL 50-0.9 MG/10ML-% IV SOSY
PREFILLED_SYRINGE | INTRAVENOUS | Status: DC | PRN
  Administered 2016-06-02 (×2): 5 mg via INTRAVENOUS

## 2016-06-02 MED ORDER — MIDAZOLAM HCL 2 MG/2ML IJ SOLN
INTRAMUSCULAR | Status: DC | PRN
  Administered 2016-06-02: 2 mg via INTRAVENOUS

## 2016-06-02 MED ORDER — DEXTROSE 5 % IV SOLN
3.0000 g | Freq: Once | INTRAVENOUS | Status: DC
  Filled 2016-06-02 (×2): qty 3000

## 2016-06-02 MED ORDER — ONDANSETRON HCL 4 MG/2ML IJ SOLN
INTRAMUSCULAR | Status: DC | PRN
  Administered 2016-06-02: 4 mg via INTRAVENOUS

## 2016-06-02 MED ORDER — BISACODYL 10 MG RE SUPP: 10.0000 mg | Freq: Every day | RECTAL | Status: DC | PRN

## 2016-06-02 MED ORDER — PROPOFOL 10 MG/ML IV BOLUS
INTRAVENOUS | Status: AC
  Filled 2016-06-02: qty 40

## 2016-06-02 MED ORDER — VANCOMYCIN HCL 1000 MG IV SOLR
INTRAVENOUS | Status: DC | PRN
  Administered 2016-06-02: 1000 mg via TOPICAL

## 2016-06-02 MED ORDER — DEXTROSE 5 % IV SOLN
INTRAVENOUS | Status: DC | PRN
  Administered 2016-06-02: 50 ug/min via INTRAVENOUS

## 2016-06-02 MED ORDER — GABAPENTIN 600 MG PO TABS
600.0000 mg | ORAL_TABLET | Freq: Three times a day (TID) | ORAL | Status: DC
  Administered 2016-06-02 – 2016-06-05 (×8): 600 mg via ORAL
  Filled 2016-06-02 (×8): qty 1

## 2016-06-02 MED ORDER — DIAZEPAM 5 MG PO TABS
5.0000 mg | ORAL_TABLET | Freq: Four times a day (QID) | ORAL | Status: DC | PRN
  Administered 2016-06-02 – 2016-06-04 (×4): 5 mg via ORAL
  Filled 2016-06-02 (×4): qty 1

## 2016-06-02 MED ORDER — FENTANYL CITRATE (PF) 100 MCG/2ML IJ SOLN
INTRAMUSCULAR | Status: AC
  Administered 2016-06-02: 50 ug via INTRAVENOUS
  Filled 2016-06-02: qty 2

## 2016-06-02 MED ORDER — ONDANSETRON HCL 4 MG/2ML IJ SOLN: 4.0000 mg | INTRAMUSCULAR | Status: DC | PRN

## 2016-06-02 MED ORDER — FENTANYL CITRATE (PF) 100 MCG/2ML IJ SOLN
INTRAMUSCULAR | Status: AC
  Filled 2016-06-02: qty 2

## 2016-06-02 MED ORDER — OXYCODONE-ACETAMINOPHEN 5-325 MG PO TABS
1.0000 | ORAL_TABLET | ORAL | Status: DC | PRN
  Administered 2016-06-02: 2 via ORAL
  Administered 2016-06-02: 1 via ORAL
  Administered 2016-06-03 – 2016-06-04 (×8): 2 via ORAL
  Administered 2016-06-05: 1 via ORAL
  Administered 2016-06-05 (×2): 2 via ORAL
  Filled 2016-06-02 (×12): qty 2

## 2016-06-02 MED ORDER — LOSARTAN POTASSIUM 50 MG PO TABS
100.0000 mg | ORAL_TABLET | Freq: Every day | ORAL | Status: DC
  Administered 2016-06-03 – 2016-06-05 (×3): 100 mg via ORAL
  Filled 2016-06-02 (×3): qty 2

## 2016-06-02 MED ORDER — THROMBIN 20000 UNITS EX SOLR
CUTANEOUS | Status: DC | PRN
  Administered 2016-06-02: 13:00:00 via TOPICAL

## 2016-06-02 MED ORDER — ONDANSETRON HCL 4 MG/2ML IJ SOLN
INTRAMUSCULAR | Status: AC
  Filled 2016-06-02: qty 2

## 2016-06-02 MED ORDER — DEXTROSE 5 % IV SOLN
3.0000 g | Freq: Three times a day (TID) | INTRAVENOUS | Status: AC
  Administered 2016-06-02 – 2016-06-03 (×2): 3 g via INTRAVENOUS
  Filled 2016-06-02 (×3): qty 3000

## 2016-06-02 MED ORDER — HYDROCHLOROTHIAZIDE 25 MG PO TABS
25.0000 mg | ORAL_TABLET | Freq: Every day | ORAL | Status: DC
  Administered 2016-06-03 – 2016-06-05 (×3): 25 mg via ORAL
  Filled 2016-06-02 (×3): qty 1

## 2016-06-02 MED ORDER — MORPHINE SULFATE (PF) 2 MG/ML IV SOLN
1.0000 mg | INTRAVENOUS | Status: DC | PRN
  Administered 2016-06-02: 4 mg via INTRAVENOUS
  Administered 2016-06-03 – 2016-06-04 (×3): 2 mg via INTRAVENOUS
  Filled 2016-06-02 (×3): qty 1
  Filled 2016-06-02: qty 2

## 2016-06-02 MED ORDER — LIDOCAINE 2% (20 MG/ML) 5 ML SYRINGE
INTRAMUSCULAR | Status: DC | PRN
  Administered 2016-06-02: 100 mg via INTRAVENOUS

## 2016-06-02 MED ORDER — BUPIVACAINE LIPOSOME 1.3 % IJ SUSP
20.0000 mL | Freq: Once | INTRAMUSCULAR | Status: DC
  Filled 2016-06-02: qty 20

## 2016-06-02 MED ORDER — ROCURONIUM BROMIDE 100 MG/10ML IV SOLN
INTRAVENOUS | Status: DC | PRN
  Administered 2016-06-02 (×3): 20 mg via INTRAVENOUS
  Administered 2016-06-02: 10 mg via INTRAVENOUS
  Administered 2016-06-02: 50 mg via INTRAVENOUS
  Administered 2016-06-02 (×4): 20 mg via INTRAVENOUS

## 2016-06-02 MED ORDER — ACETAMINOPHEN 325 MG PO TABS
650.0000 mg | ORAL_TABLET | ORAL | Status: DC | PRN
  Administered 2016-06-04: 650 mg via ORAL
  Filled 2016-06-02 (×2): qty 2

## 2016-06-02 MED ORDER — ALUM & MAG HYDROXIDE-SIMETH 200-200-20 MG/5ML PO SUSP: 30.0000 mL | Freq: Four times a day (QID) | ORAL | Status: DC | PRN

## 2016-06-02 MED ORDER — PHENOL 1.4 % MT LIQD: 1.0000 | OROMUCOSAL | Status: DC | PRN

## 2016-06-02 MED ORDER — ROCURONIUM BROMIDE 50 MG/5ML IV SOLN
INTRAVENOUS | Status: AC
  Filled 2016-06-02: qty 1

## 2016-06-02 MED ORDER — EPHEDRINE 5 MG/ML INJ
INTRAVENOUS | Status: AC
  Filled 2016-06-02: qty 10

## 2016-06-02 MED ORDER — BUPIVACAINE LIPOSOME 1.3 % IJ SUSP
INTRAMUSCULAR | Status: DC | PRN
  Administered 2016-06-02: 20 mL

## 2016-06-02 MED ORDER — PROPOFOL 10 MG/ML IV BOLUS
INTRAVENOUS | Status: DC | PRN
  Administered 2016-06-02: 250 mg via INTRAVENOUS

## 2016-06-02 MED ORDER — PHENYLEPHRINE 40 MCG/ML (10ML) SYRINGE FOR IV PUSH (FOR BLOOD PRESSURE SUPPORT)
PREFILLED_SYRINGE | INTRAVENOUS | Status: DC | PRN
  Administered 2016-06-02 (×2): 120 ug via INTRAVENOUS

## 2016-06-02 MED ORDER — SUGAMMADEX SODIUM 500 MG/5ML IV SOLN
INTRAVENOUS | Status: DC | PRN
  Administered 2016-06-02: 336.6 mg via INTRAVENOUS

## 2016-06-02 MED ORDER — HYDROCODONE-ACETAMINOPHEN 5-325 MG PO TABS
1.0000 | ORAL_TABLET | ORAL | Status: DC | PRN
  Administered 2016-06-03: 2 via ORAL
  Filled 2016-06-02: qty 2

## 2016-06-02 MED ORDER — MIDAZOLAM HCL 2 MG/2ML IJ SOLN
INTRAMUSCULAR | Status: AC
  Filled 2016-06-02: qty 2

## 2016-06-02 MED ORDER — LACTATED RINGERS IV SOLN: INTRAVENOUS | Status: DC

## 2016-06-02 MED ORDER — ACETAMINOPHEN 160 MG/5ML PO SOLN
960.0000 mg | Freq: Once | ORAL | Status: AC
  Administered 2016-06-02: 960 mg via ORAL
  Filled 2016-06-02: qty 30

## 2016-06-02 MED ORDER — FENTANYL CITRATE (PF) 100 MCG/2ML IJ SOLN
25.0000 ug | INTRAMUSCULAR | Status: DC | PRN
  Administered 2016-06-02 (×3): 50 ug via INTRAVENOUS

## 2016-06-02 MED ORDER — MENTHOL 3 MG MT LOZG: 1.0000 | LOZENGE | OROMUCOSAL | Status: DC | PRN

## 2016-06-02 MED ORDER — SODIUM CHLORIDE 0.9 % IR SOLN
Status: DC | PRN
  Administered 2016-06-02: 13:00:00

## 2016-06-02 MED ORDER — PRAVASTATIN SODIUM 40 MG PO TABS
40.0000 mg | ORAL_TABLET | Freq: Every day | ORAL | Status: DC
  Administered 2016-06-03 – 2016-06-04 (×2): 40 mg via ORAL
  Filled 2016-06-02 (×2): qty 1

## 2016-06-02 MED ORDER — BUPIVACAINE-EPINEPHRINE (PF) 0.5% -1:200000 IJ SOLN
INTRAMUSCULAR | Status: DC | PRN
  Administered 2016-06-02: 10 mL via PERINEURAL

## 2016-06-02 MED ORDER — ACETAMINOPHEN 650 MG RE SUPP: 650.0000 mg | RECTAL | Status: DC | PRN

## 2016-06-02 MED ORDER — FENTANYL CITRATE (PF) 250 MCG/5ML IJ SOLN
INTRAMUSCULAR | Status: AC
Start: 2016-06-02 — End: 2016-06-02
  Filled 2016-06-02: qty 5

## 2016-06-02 MED ORDER — ARTIFICIAL TEARS OP OINT
TOPICAL_OINTMENT | OPHTHALMIC | Status: DC | PRN
  Administered 2016-06-02: 1 via OPHTHALMIC

## 2016-06-02 MED ORDER — FENTANYL CITRATE (PF) 250 MCG/5ML IJ SOLN
INTRAMUSCULAR | Status: DC | PRN
  Administered 2016-06-02 (×2): 50 ug via INTRAVENOUS
  Administered 2016-06-02: 150 ug via INTRAVENOUS
  Administered 2016-06-02 (×3): 50 ug via INTRAVENOUS

## 2016-06-02 MED ORDER — ALBUMIN HUMAN 5 % IV SOLN
INTRAVENOUS | Status: DC | PRN
  Administered 2016-06-02: 17:00:00 via INTRAVENOUS

## 2016-06-02 MED ORDER — SUGAMMADEX SODIUM 500 MG/5ML IV SOLN
INTRAVENOUS | Status: AC
  Filled 2016-06-02: qty 5

## 2016-06-02 MED ORDER — PHENYLEPHRINE 40 MCG/ML (10ML) SYRINGE FOR IV PUSH (FOR BLOOD PRESSURE SUPPORT)
PREFILLED_SYRINGE | INTRAVENOUS | Status: AC
  Filled 2016-06-02: qty 10

## 2016-06-02 MED ORDER — DOCUSATE SODIUM 100 MG PO CAPS
100.0000 mg | ORAL_CAPSULE | Freq: Two times a day (BID) | ORAL | Status: DC
  Administered 2016-06-02 – 2016-06-05 (×6): 100 mg via ORAL
  Filled 2016-06-02 (×6): qty 1

## 2016-06-02 MED ORDER — VANCOMYCIN HCL 1000 MG IV SOLR
INTRAVENOUS | Status: AC
  Filled 2016-06-02: qty 1000

## 2016-06-02 SURGICAL SUPPLY — 65 items
BAG DECANTER FOR FLEXI CONT (MISCELLANEOUS) ×3 IMPLANT
BENZOIN TINCTURE PRP APPL 2/3 (GAUZE/BANDAGES/DRESSINGS) ×3 IMPLANT
BLADE CLIPPER SURG (BLADE) IMPLANT
BLADE SURG 10 STRL SS (BLADE) ×3 IMPLANT
BRUSH SCRUB EZ PLAIN DRY (MISCELLANEOUS) ×3 IMPLANT
BUR MATCHSTICK NEURO 3.0 LAGG (BURR) ×3 IMPLANT
BUR PRECISION FLUTE 6.0 (BURR) ×3 IMPLANT
CANISTER SUCT 3000ML PPV (MISCELLANEOUS) ×3 IMPLANT
CAP REVERE LOCKING (Cap) ×24 IMPLANT
CLOSURE WOUND 1/2 X4 (GAUZE/BANDAGES/DRESSINGS) ×1
CLOSURE WOUND 1/4X4 (GAUZE/BANDAGES/DRESSINGS) ×1
CONT SPEC 4OZ CLIKSEAL STRL BL (MISCELLANEOUS) ×3 IMPLANT
COVER BACK TABLE 60X90IN (DRAPES) IMPLANT
DRAPE C-ARM 42X72 X-RAY (DRAPES) ×6 IMPLANT
DRAPE HALF SHEET 40X57 (DRAPES) ×3 IMPLANT
DRAPE LAPAROTOMY 100X72X124 (DRAPES) ×3 IMPLANT
DRAPE POUCH INSTRU U-SHP 10X18 (DRAPES) ×3 IMPLANT
DRAPE SURG 17X23 STRL (DRAPES) ×12 IMPLANT
ELECT BLADE 4.0 EZ CLEAN MEGAD (MISCELLANEOUS) ×6
ELECT REM PT RETURN 9FT ADLT (ELECTROSURGICAL) ×3
ELECTRODE BLDE 4.0 EZ CLN MEGD (MISCELLANEOUS) ×2 IMPLANT
ELECTRODE REM PT RTRN 9FT ADLT (ELECTROSURGICAL) ×1 IMPLANT
GAUZE SPONGE 4X4 12PLY STRL (GAUZE/BANDAGES/DRESSINGS) ×3 IMPLANT
GAUZE SPONGE 4X4 16PLY XRAY LF (GAUZE/BANDAGES/DRESSINGS) ×3 IMPLANT
GLOVE BIO SURGEON STRL SZ8 (GLOVE) ×9 IMPLANT
GLOVE BIO SURGEON STRL SZ8.5 (GLOVE) ×6 IMPLANT
GLOVE EXAM NITRILE LRG STRL (GLOVE) IMPLANT
GLOVE EXAM NITRILE MD LF STRL (GLOVE) IMPLANT
GLOVE EXAM NITRILE XL STR (GLOVE) IMPLANT
GLOVE EXAM NITRILE XS STR PU (GLOVE) IMPLANT
GLOVE INDICATOR 8.5 STRL (GLOVE) ×3 IMPLANT
GOWN STRL REUS W/ TWL LRG LVL3 (GOWN DISPOSABLE) IMPLANT
GOWN STRL REUS W/ TWL XL LVL3 (GOWN DISPOSABLE) ×2 IMPLANT
GOWN STRL REUS W/TWL 2XL LVL3 (GOWN DISPOSABLE) IMPLANT
GOWN STRL REUS W/TWL LRG LVL3 (GOWN DISPOSABLE)
GOWN STRL REUS W/TWL XL LVL3 (GOWN DISPOSABLE) ×4
KIT BASIN OR (CUSTOM PROCEDURE TRAY) ×3 IMPLANT
KIT ROOM TURNOVER OR (KITS) ×3 IMPLANT
NEEDLE HYPO 21X1.5 SAFETY (NEEDLE) ×3 IMPLANT
NEEDLE HYPO 22GX1.5 SAFETY (NEEDLE) ×3 IMPLANT
NS IRRIG 1000ML POUR BTL (IV SOLUTION) ×3 IMPLANT
PACK LAMINECTOMY NEURO (CUSTOM PROCEDURE TRAY) ×3 IMPLANT
PAD ARMBOARD 7.5X6 YLW CONV (MISCELLANEOUS) ×9 IMPLANT
PATTIES SURGICAL .5 X1 (DISPOSABLE) IMPLANT
PATTIES SURGICAL 1X1 (DISPOSABLE) ×3 IMPLANT
PENCIL BUTTON HOLSTER BLD 10FT (ELECTRODE) ×3 IMPLANT
ROD CURVED REVERE 6.35 95MM (Rod) ×6 IMPLANT
SCREW REVERE 6.35 75X55MM (Screw) ×24 IMPLANT
SPACER TLIF SIGNATURE SM 7MM (Spacer) ×3 IMPLANT
SPACER TLIF SIGNATURE SM 8MM ×6 IMPLANT
SPONGE LAP 4X18 X RAY DECT (DISPOSABLE) IMPLANT
SPONGE NEURO XRAY DETECT 1X3 (DISPOSABLE) IMPLANT
SPONGE SURGIFOAM ABS GEL 100 (HEMOSTASIS) ×3 IMPLANT
STRIP BIOACTIVE 20CC 25X100X8 (Miscellaneous) ×9 IMPLANT
STRIP CLOSURE SKIN 1/2X4 (GAUZE/BANDAGES/DRESSINGS) ×2 IMPLANT
STRIP CLOSURE SKIN 1/4X4 (GAUZE/BANDAGES/DRESSINGS) ×2 IMPLANT
SUT VIC AB 1 CT1 18XBRD ANBCTR (SUTURE) ×2 IMPLANT
SUT VIC AB 1 CT1 8-18 (SUTURE) ×4
SUT VIC AB 2-0 CP2 18 (SUTURE) ×6 IMPLANT
SYR 20CC LL (SYRINGE) ×3 IMPLANT
TAPE CLOTH SURG 4X10 WHT LF (GAUZE/BANDAGES/DRESSINGS) ×3 IMPLANT
TOWEL OR 17X24 6PK STRL BLUE (TOWEL DISPOSABLE) ×3 IMPLANT
TOWEL OR 17X26 10 PK STRL BLUE (TOWEL DISPOSABLE) ×3 IMPLANT
TRAY FOLEY W/METER SILVER 16FR (SET/KITS/TRAYS/PACK) ×3 IMPLANT
WATER STERILE IRR 1000ML POUR (IV SOLUTION) ×3 IMPLANT

## 2016-06-02 NOTE — H&P (Signed)
Subjective: The patient is a 60 year old white male who has complained of back, buttock, and leg pain consistent with neurogenic claudication. He has failed medical management and was worked up with a lumbar myelo CT. This demonstrated multilevel spondylolisthesis, spinal stenosis, etc. I discussed the situation with the patient and reviewed his imaging studies with him. We discussed the various treatment options including surgery. The patient has weighed the risks, benefits, and alternative surgery and decided to proceed with a 3 level lumbar decompression, instrumentation, and fusion.   Past Medical History:  Diagnosis Date  . Arthritis   . Elevated PSA   . Gout   . Heart murmur   . History of chicken pox   . Hyperlipemia   . Hypertension    Essential  . Microscopic hematuria   . Myasthenia gravis ([place])   . Pacemaker   . Prostate cancer [facility])    Radiation    Past Surgical History:  Procedure Laterality Date  . BASAL CELL CARCINOMA EXCISION  12/28/2012   Anterior neck, done by Dr. Lacinda Axon at [facility]  . CARPAL TUNNEL RELEASE Left   . COLONOSCOPY W/ POLYPECTOMY    . INSERT / REPLACE / REMOVE PACEMAKER  12/24/2014   Medtronic  . KNEE SURGERY Right 1988   Tear of LCL of knee  . MELANOMA EXCISION  2013,2015   X 2; Neck & Back    No Known Allergies  Social History  Substance Use Topics  . Smoking status: Former Smoker    Packs/day: 1.00    Years: 20.00    Types: Cigarettes    Quit date: 11/02/2003  . Smokeless tobacco: Former Systems developer  . Alcohol use 0.0 oz/week     Comment: 2 drinks every weekend    Family History  Problem Relation Age of Onset  . Heart failure Father   . Heart attack Father   . Hypertension Mother   . Heart Problems Mother   . Heart disease Other   . Hyperthyroidism Other   . Diabetes Other   . Cancer Neg Hx    Prior to Admission medications   Medication Sig Start Date End Date Taking? Authorizing Provider  aspirin EC 81 MG tablet Take 81 mg by mouth daily.    Yes Historical Provider, MD  diclofenac (CATAFLAM) 50 MG tablet TAKE 1 TABLET BY MOUTH TWICE A DAY AS NEEDED FOR BACK PAIN 05/23/16  Yes Birdie Sons, MD  gabapentin (NEURONTIN) 600 MG tablet Take 600 mg by mouth 3 (three) times daily.   Yes Historical Provider, MD  Glucosamine-Chondroitin 250-200 MG TABS Take 1 tablet by mouth daily.   Yes Historical Provider, MD  losartan-hydrochlorothiazide (HYZAAR) 100-25 MG tablet TAKE 1 TABLET BY MOUTH DAILY 12/07/15  Yes Birdie Sons, MD  lovastatin (MEVACOR) 40 MG tablet Take 40 mg by mouth daily.   Yes Historical Provider, MD     Review of Systems  Positive ROS: As above  All other systems have been reviewed and were otherwise negative with the exception of those mentioned in the HPI and as above.  Objective: Vital signs in last 24 hours: Temp:  [98.6 F (37 C)] 98.6 F (37 C) (08/02 0832) Pulse Rate:  [86] 86 (08/02 0832) Resp:  [20] 20 (08/02 0832) BP: (145)/(94) 145/94 (08/02 0832) SpO2:  [100 %] 100 % (08/02 0832) Weight:  [168.3 kg (371 lb)] 168.3 kg (371 lb) (08/02 0840)  General Appearance: Alert, cooperative, no distress, Head: Normocephalic, without obvious abnormality, atraumatic Eyes: PERRL, conjunctiva/corneas clear,  EOM's intact,    Ears: Normal  Throat: Normal  Neck: Supple, symmetrical, trachea midline, no adenopathy; thyroid: No enlargement/tenderness/nodules; no carotid bruit or JVD Back: Symmetric, no curvature, ROM normal, no CVA tenderness Lungs: Clear to auscultation bilaterally, respirations unlabored Heart: Regular rate and rhythm, no murmur, rub or gallop Abdomen: Soft, non-tender,, no masses, no organomegaly Extremities: Extremities normal, atraumatic, no cyanosis or edema Pulses: 2+ and symmetric all extremities Skin: Skin color, texture, turgor normal, no rashes or lesions  NEUROLOGIC:   Mental status: alert and oriented, no aphasia, good attention span, Fund of knowledge/ memory ok Motor Exam -  grossly normal Sensory Exam - grossly normal Reflexes:  Coordination - grossly normal Gait - grossly normal Balance - grossly normal Cranial Nerves: I: smell Not tested  II: visual acuity  OS: Normal  OD: Normal   II: visual fields Full to confrontation  II: pupils Equal, round, reactive to light  III,VII: ptosis None  III,IV,VI: extraocular muscles  Full ROM  V: mastication Normal  V: facial light touch sensation  Normal  V,VII: corneal reflex  Present  VII: facial muscle function - upper  Normal  VII: facial muscle function - lower Normal  VIII: hearing Not tested  IX: soft palate elevation  Normal  IX,X: gag reflex Present  XI: trapezius strength  5/5  XI: sternocleidomastoid strength 5/5  XI: neck flexion strength  5/5  XII: tongue strength  Normal    Data Review Lab Results  Component Value Date   WBC 7.9 05/25/2016   HGB 13.0 05/25/2016   HCT 39.2 05/25/2016   MCV 94.7 05/25/2016   PLT 203 05/25/2016   Lab Results  Component Value Date   NA 138 05/25/2016   K 3.7 05/25/2016   CL 104 05/25/2016   CO2 25 05/25/2016   BUN 11 05/25/2016   CREATININE 1.06 05/25/2016   GLUCOSE 164 (H) 05/25/2016   No results found for: INR, PROTIME  Assessment/Plan: L3-4, L4-5 and L5-S1 spondylolisthesis, spinal stenosis, lumbar radiculopathy, neurogenic claudication: I have discussed the situation with the patient. I have reviewed his imaging studies with him and pointed out the abnormalities. We have discussed the various treatment options including surgery. I have described the surgical treatment option of an L3-4, L4-5 and L5-S1 decompression, instrumentation, and fusion. I have shown him surgical models. We have discussed the risks, benefits, alternatives, expected postoperative course, and likelihood of achieving her goals with surgery. I have answered all the patient's questions. He has decided to proceed with surgery.   Larie Mathes D 06/02/2016 12:00 PM

## 2016-06-02 NOTE — Anesthesia Postprocedure Evaluation (Signed)
Anesthesia Post Note  Patient: [name]  Procedure(s) Performed: Procedure(s) (LRB): Lumbar three- four Lumbar four- five Lumbar five Sacral one Posterior lumbar interbody fusion/Interbody prosthesis/Posterior segmental instrumentation/Posterior arthrodesis (N/A)  Patient location during evaluation: PACU Anesthesia Type: General Level of consciousness: awake and alert Pain management: pain level controlled Vital Signs Assessment: post-procedure vital signs reviewed and stable Respiratory status: spontaneous breathing, nonlabored ventilation, respiratory function stable and patient connected to nasal cannula oxygen Cardiovascular status: blood pressure returned to baseline and stable Postop Assessment: no signs of nausea or vomiting Anesthetic complications: no    Last Vitals:  Vitals:   06/02/16 1908 06/02/16 1923  BP: 130/67 117/65  Pulse: 72 (!) 33  Resp: 13 11  Temp:      Last Pain:  Vitals:   06/02/16 1930  TempSrc:   PainSc: 4                  Jhayden Demuro,W. EDMOND

## 2016-06-02 NOTE — Anesthesia Procedure Notes (Addendum)
Procedure Name: Intubation Date/Time: 06/02/2016 12:19 PM Performed by: Trixie Deis A Pre-anesthesia Checklist: Patient identified, Emergency Drugs available, Suction available, Patient being monitored and Timeout performed Patient Re-evaluated:Patient Re-evaluated prior to inductionOxygen Delivery Method: Circle system utilized Preoxygenation: Pre-oxygenation with 100% oxygen Intubation Type: IV induction Ventilation: Mask ventilation without difficulty and Oral airway inserted - appropriate to patient size Laryngoscope Size: Glidescope and 4 Grade View: Grade I Tube type: Oral Tube size: 7.5 mm Number of attempts: 1 Airway Equipment and Method: Rigid stylet and Video-laryngoscopy Placement Confirmation: ETT inserted through vocal cords under direct vision Secured at: 24 cm Tube secured with: Tape Dental Injury: Teeth and Oropharynx as per pre-operative assessment

## 2016-06-02 NOTE — Transfer of Care (Signed)
Immediate Anesthesia Transfer of Care Note  Patient: Garrett Woods  Procedure(s) Performed: Procedure(s) with comments: Lumbar three- four Lumbar four- five Lumbar five Sacral one Posterior lumbar interbody fusion/Interbody prosthesis/Posterior segmental instrumentation/Posterior arthrodesis (N/A) - L3-4 L4-5 L5-S1 Posterior lumbar interbody fusion/Interbody prosthesis/Posterior segmental instrumentation/Posterior arthrodesis  Patient Location: PACU  Anesthesia Type:General  Level of Consciousness: awake, oriented and patient cooperative  Airway & Oxygen Therapy: Patient Spontanous Breathing and Patient connected to nasal cannula oxygen  Post-op Assessment: Report given to RN, Post -op Vital signs reviewed and stable and Patient moving all extremities  Post vital signs: Reviewed and stable  Last Vitals:  Vitals:   06/02/16 0832 06/02/16 1830  BP: (!) 145/94   Pulse: 86   Resp: 20   Temp: 37 C (P) 36.6 C    Last Pain:  Vitals:   06/02/16 0832  TempSrc: Oral      Patients Stated Pain Goal: 4 ([id number])  Complications: No apparent anesthesia complications

## 2016-06-02 NOTE — Progress Notes (Signed)
Subjective:  The patient is somnolent but easily arousable. He is in no apparent distress. He looks well.  Objective: Vital signs in last 24 hours: Temp:  [97.9 F (36.6 C)-98.6 F (37 C)] (P) 97.9 F (36.6 C) (08/02 1830) Pulse Rate:  [86] 86 (08/02 0832) Resp:  [20] 20 (08/02 0832) BP: (145)/(94) 145/94 (08/02 0832) SpO2:  [100 %] 100 % (08/02 0832) Weight:  [168.3 kg (371 lb)] 168.3 kg (371 lb) (08/02 0840)  Intake/Output from previous day: No intake/output data recorded. Intake/Output this shift: Total I/O In: 2050 [I.V.:1800; IV Piggyback:250] Out: 865 [Urine:615; Blood:250]  Physical exam the patient is somnolent but arousable. He is moving his lower extremities well.  Lab Results: No results for input(s): WBC, HGB, HCT, PLT in the last 72 hours. BMET No results for input(s): NA, K, CL, CO2, GLUCOSE, BUN, CREATININE, CALCIUM in the last 72 hours.  Studies/Results: Dg Lumbar Spine 2-3 Views  Result Date: 06/02/2016 CLINICAL DATA:  Intraoperative localization for PLIF at L3-4, L4-5, and L5-S1. EXAM: LUMBAR SPINE - 2-3 VIEW COMPARISON:  Preoperative CT myelogram 04/08/2016 FINDINGS: Two lateral fluoroscopic spot images of the lumbar spine obtained. Images demonstrate surgical instruments localizing posterior to L5. IMPRESSION: Intraoperative localization with surgical instruments localize posterior to L5. Electronically Signed   By: Jeb Levering M.D.   On: 06/02/2016 18:03   Dg Lumbar Spine 2-3 Views  Result Date: 06/02/2016 CLINICAL DATA:  L3-S1 PLIF EXAM: LUMBAR SPINE - 2-3 VIEW; DG C-ARM 61-120 MIN FLUOROSCOPY TIME:  41 seconds COMPARISON:  CT lumbar spine dated 04/08/2016 FINDINGS: Intraoperative frontal and lateral fluoroscopic images during L3-S1 PLIF. IMPRESSION: Intraoperative fluoroscopic images, as above. Electronically Signed   By: Julian Hy M.D.   On: 06/02/2016 17:55   Dg C-arm 61-120 Min  Result Date: 06/02/2016 CLINICAL DATA:  L3-S1 PLIF EXAM: LUMBAR  SPINE - 2-3 VIEW; DG C-ARM 61-120 MIN FLUOROSCOPY TIME:  41 seconds COMPARISON:  CT lumbar spine dated 04/08/2016 FINDINGS: Intraoperative frontal and lateral fluoroscopic images during L3-S1 PLIF. IMPRESSION: Intraoperative fluoroscopic images, as above. Electronically Signed   By: Julian Hy M.D.   On: 06/02/2016 17:55    Assessment/Plan: The patient is doing well. I spoke with his family.  LOS: 0 days     Aza Dantes D 06/02/2016, 6:40 PM

## 2016-06-02 NOTE — Op Note (Signed)
Brief history: The patient is a 60 year old white male who has complained of back, buttock, and leg pain consistent with neurogenic claudication. He has failed medical management and was worked up with a lumbar myelo CT which demonstrated he had severe stenosis at L3-4 with degenerative disease and severe facet arthropathy at L3-4, L4-5 and L5-S1. I discussed the various treatment options with the patient including surgery. The patient has weighed the risks, benefits, and alternatives to surgery and decided to proceed with a 3 level lumbar decompression and fusion.  Preoperative diagnosis:  L3-4, L4-5 and L5-S1 Degenerative disc disease, facet arthropathy, spinal stenosis compressing  the L3, L4, L5 and S1 nerve roots; lumbago; lumbar radiculopathy  Postoperative diagnosis: The same  Procedure: Bilateral L3-4, L4-5 and L5-S1 Laminotomy/foraminotomies to decompress the bilateral L3, L4, L5 and S1 nerve roots(the work required to do this was in addition to the work required to do the posterior lumbar interbody fusion because of the patient's spinal stenosis, facet arthropathy. Etc. requiring a wide decompression of the nerve roots.); L3-4, L4-5 and L5-S1 transforaminal lumbar interbody fusion with local morselized autograft bone and Kinnex graft extender; insertion of interbody prosthesis at L3-4, L4-5 and L5-S1 (globus peek interbody prosthesis); posterior segmental instrumentation from L3 to S1 with globus titanium pedicle screws and rods; posterior lateral arthrodesis at L3-4, L4-5 and L5-S1 with local morselized autograft bone and Kinnex bone graft extender.  Surgeon: Dr. Earle Gell  Asst.: Dr. Saintclair Halsted  Anesthesia: Gen. endotracheal  Estimated blood loss: 300 mL  Drains: None  Complications: None  Description of procedure: The patient was brought to the operating room by the anesthesia team. General endotracheal anesthesia was induced. The patient was turned to the prone position on the Wilson  frame. The patient's lumbosacral region was then prepared with Betadine scrub and Betadine solution. Sterile drapes were applied.  I then injected the area to be incised with Marcaine with epinephrine solution. I then used the scalpel to make a linear midline incision over the L3-4, L4-5 and L5-S1 interspace. I then used electrocautery to perform a bilateral subperiosteal dissection exposing the spinous process and lamina of L3, L4, L5 and the upper sacrum. We then obtained intraoperative radiograph to confirm our location. We then inserted the Verstrac retractor to provide exposure.  I began the decompression by using the high speed drill to perform laminotomies at L3-4, L4-5 and L5-S1 bilaterally. We then used the Kerrison punches to widen the laminotomy and removed the ligamentum flavum at L3-4, L4-5 and L5-S1 bilaterally. We used the Kerrison punches to remove the medial facets at L3-4, L4-5 and L5-S1 bilaterally. We performed wide foraminotomies about the bilateral L3, L4, L5 and S1 nerve roots completing the decompression.  We now turned our attention to the posterior lumbar interbody fusion. I used a scalpel to incise the intervertebral disc at L3-4, L4-5 and L5-S1 bilaterally. I then performed a partial intervertebral discectomy at L3-4, L4-5 and L5-S1 bilaterally using the pituitary forceps. We prepared the vertebral endplates at [id number], [id number], L5-S1 bilaterally for the fusion by removing the soft tissues with the curettes. We then used the trial spacers to pick the appropriate sized interbody prosthesis. We prefilled his prosthesis with a combination of local morselized autograft bone that we obtained during the decompression as well as Kinnex bone graft extender. We inserted the prefilled prosthesis into the interspace at L3-4, L4-5 and L5-S1 from the right side. There was a good snug fit of the prosthesis in the interspace. We then filled and  the remainder of the intervertebral disc space with local  morselized autograft bone and Kinnex. This completed the posterior lumbar interbody arthrodesis.  We now turned attention to the instrumentation. Under fluoroscopic guidance we cannulated the bilateral L3, L4, L5 and S1 pedicles with the bone probe. We then removed the bone probe. We then tapped the pedicle with a 6.5 millimeter tap. We then removed the tap. We probed inside the tapped pedicle with a ball probe to rule out cortical breaches. We then inserted a 7.5 x 55 millimeter pedicle screw into the L3, L4, L5 and S1 pedicles bilaterally under fluoroscopic guidance. We then palpated along the medial aspect of the pedicles to rule out cortical breaches. There were none. The nerve roots were not injured. We then connected the unilateral pedicle screws with a lordotic rod. We compressed the construct and secured the rod in place with the caps. We then tightened the caps appropriately. This completed the instrumentation from L3-S1.  We now turned our attention to the posterior lateral arthrodesis at L3-4, L4-5 and L5-S1. We used the high-speed drill to decorticate the remainder of the facets, pars, transverse process at L3-4, L4-5 and L5-S1. We then applied a combination of local morselized autograft bone and Kinnex bone graft extender over these decorticated posterior lateral structures. This completed the posterior lateral arthrodesis.  We then obtained hemostasis using bipolar electrocautery. We irrigated the wound out with bacitracin solution. We inspected the thecal sac and nerve roots and noted they were well decompressed. We then removed the retractor. We placed vancomycin powder in the wound. We reapproximated patient's thoracolumbar fascia with interrupted #1 Vicryl suture. We reapproximated patient's subcutaneous tissue with interrupted 2-0 Vicryl suture. The reapproximated patient's skin with Steri-Strips and benzoin. The wound was then coated with bacitracin ointment. A sterile dressing was applied.  The drapes were removed. The patient was subsequently returned to the supine position where they were extubated by the anesthesia team. He was then transported to the post anesthesia care unit in stable condition. All sponge instrument and needle counts were reportedly correct at the end of this case.

## 2016-06-03 LAB — CBC
HEMATOCRIT: 32.2 % — AB (ref 39.0–52.0)
HEMOGLOBIN: 10.5 g/dL — AB (ref 13.0–17.0)
MCH: 31.8 pg (ref 26.0–34.0)
MCHC: 32.6 g/dL (ref 30.0–36.0)
MCV: 97.6 fL (ref 78.0–100.0)
Platelets: 189 10*3/uL (ref 150–400)
RBC: 3.3 MIL/uL — AB (ref 4.22–5.81)
RDW: 14 % (ref 11.5–15.5)
WBC: 10 10*3/uL (ref 4.0–10.5)

## 2016-06-03 LAB — BASIC METABOLIC PANEL
ANION GAP: 9 (ref 5–15)
BUN: 17 mg/dL (ref 6–20)
CALCIUM: 8.8 mg/dL — AB (ref 8.9–10.3)
CHLORIDE: 102 mmol/L (ref 101–111)
CO2: 26 mmol/L (ref 22–32)
Creatinine, Ser: 1.18 mg/dL (ref 0.61–1.24)
GFR calc non Af Amer: >60 mL/min (ref >60)
GLUCOSE: 125 mg/dL — AB (ref 65–99)
POTASSIUM: 4.4 mmol/L (ref 3.5–5.1)
Sodium: 137 mmol/L (ref 135–145)

## 2016-06-03 MED FILL — Heparin Sodium (Porcine) Inj 1000 Unit/ML: INTRAMUSCULAR | Qty: 30 | Status: AC

## 2016-06-03 MED FILL — Sodium Chloride IV Soln 0.9%: INTRAVENOUS | Qty: 1000 | Status: AC

## 2016-06-03 NOTE — Care Management Note (Signed)
Case Management Note  Patient Details  Name: Garrett Woods MRN: [id number] Date of Birth: [birth year]  Subjective/Objective:    Pt underwent: Lumbar three- four Lumbar four- five Lumbar five Sacral one Posterior lumbar interbody fusion/Interbody prosthesis/Posterior segmental instrumentation/Posterior arthrodesis. He is from home with his spouse.                  Action/Plan: PT recommends [place] services. Awaiting OT rec. CM following for discharge needs.   Expected Discharge Date:                  Expected Discharge Plan:  [place]  In-House Referral:     Discharge planning Services     Post Acute Care Choice:    Choice offered to:     DME Arranged:    DME Agency:     HH Arranged:    [place] Agency:     Status of Service:  In process, will continue to follow  If discussed at Long Length of Stay Meetings, dates discussed:    Additional Comments:  Pollie Friar, RN 06/03/2016, 2:12 PM

## 2016-06-03 NOTE — Evaluation (Addendum)
Physical Therapy Evaluation Patient Details Name: Garrett Woods MRN: [id number] DOB: 1955-11-22 Today's Date: 06/03/2016   History of Present Illness  60 y.o. male admitted to [facility] on 06/02/16 for elective L3-S1 decompression and fusion.  Pt with significant PMHx of pacemaker, myasthenia gravis, HTN, gout, R knee ACL repair (1988), and skin/prostate CA.  Clinical Impression  Pt is POD #1 and was able, with some difficulty, to get up OOB to the recliner chair.  He was lightheaded in sitting and standing, which did not dissipate, limiting our safety to try to progress gait today.  The Light headedness did not get worse as we remained up.  He had the most difficulty progressing to sit EOB as far as amount of assistance provided by the PT.   PT to follow acutely for deficits listed below.       Follow Up Recommendations Home health PT;Supervision for mobility/OOB    Equipment Recommendations  Rolling walker with 5" wheels;Other (comment) (bariatric)    Recommendations for Other Services   OT Consult    Precautions / Restrictions Precautions Precautions: Back Precaution Booklet Issued: Yes (comment) Precaution Comments: hanout given and reviewed Required Braces or Orthoses: Spinal Brace Spinal Brace: Lumbar corset      Mobility  Bed Mobility Overal bed mobility: Needs Assistance Bed Mobility: Rolling;Sidelying to Sit Rolling: Mod assist Sidelying to sit: Mod assist;HOB elevated       General bed mobility comments: Mod assist to support hips/trunk to get all the way to side lying, pt also having difficulty flexing knees to roll without assistance.  PT had to get behind pt and elevate HOB to help him push up onto his elbow and then all the way up to sitting EOB.  This was quite effortful and he had the use of a railing.  Pt thinks he will get a railing for his bed at home, because he cannot imagine trying to get up without it.   Transfers Overall transfer level: Needs  assistance Equipment used: Rolling walker (2 wheeled) Transfers: Sit to/from Stand Sit to Stand: From elevated surface;Min assist (his bed is high at home)         General transfer comment: Min assist to stabilize RW and support trunk during transition to stand up.   Ambulation/Gait Ambulation/Gait assistance: Min assist Ambulation Distance (Feet): 5 Feet Assistive device: Rolling walker (2 wheeled) Gait Pattern/deviations: Step-to pattern;Shuffle     General Gait Details: Verbal cues for upright trunk and straight hips and knees.  Pt putting a lot of pressure through his hands to take a few steps with RW to chair.  Chair boosed with his own sitting cushion from home.  Verbal cues for safe transition to sit.  Pt lightheaded in standing which did not dissipate with time.           Balance Overall balance assessment: Needs assistance Sitting-balance support: Feet supported;Bilateral upper extremity supported Sitting balance-[name]: Fair     Standing balance support: Bilateral upper extremity supported Standing balance-[name]: Poor                               Pertinent Vitals/Pain Pain Assessment: 0-10 Pain Score: 6  Pain Location: incisional Pain Descriptors / Indicators: Aching;Burning Pain Intervention(s): Limited activity within patient's tolerance;Monitored during session;Repositioned;RN gave pain meds during session    [place] expects to be discharged to:: Private residence Living Arrangements: Spouse/significant other Available Help at Discharge:  Family;Available 24 hours/day (wife taking off one week after he gets home.  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     [place]: Right    Extremity/Trunk Assessment   Upper Extremity Assessment: Overall WFL for tasks assessed           Lower Extremity Assessment: Generalized weakness      Cervical / Trunk Assessment: Normal  Communication   Communication: No difficulties  Cognition Arousal/Alertness: Awake/alert Behavior During Therapy: WFL for tasks assessed/performed Overall Cognitive Status: Within Functional Limits for tasks assessed                               Assessment/Plan    PT Assessment Patient needs continued PT services  PT Diagnosis Difficulty walking;Abnormality of gait;Generalized weakness;Acute pain   PT Problem List Decreased strength;Decreased activity tolerance;Decreased balance;Decreased mobility;Decreased knowledge of use of DME;Decreased knowledge of precautions;Obesity;Pain  PT Treatment Interventions DME instruction;Gait training;Stair training;Functional mobility training;Therapeutic activities;Therapeutic exercise;Balance training;Neuromuscular re-education;Patient/family education   PT Goals (Current goals can be found in the Care Plan section) Acute Rehab PT Goals Patient Stated Goal: to get home, have everything he needs to get better PT Goal Formulation: With patient/family Time For Goal Achievement: 06/10/16 Potential to Achieve Goals: Good    Frequency Min 5X/week    End of Session Equipment Utilized During Treatment: Back brace Activity Tolerance: Patient limited by pain;Other (comment) (limited by lightheadedness) Patient left: in chair;with call bell/phone within reach;with family/visitor present Nurse Communication: Mobility status         Time: [id number] PT Time Calculation (min) (ACUTE ONLY): 42 min  Charges:   PT Evaluation $PT Eval Moderate Complexity: 1 Procedure PT  Treatments $Therapeutic Activity: 8-22 mins $Self Care/Home Management: 8-22        Kilan Banfill B. [place], [place], DPT [phone]   06/03/2016, 2:03 PM

## 2016-06-03 NOTE — Progress Notes (Signed)
Patient ID: Garrett Woods, male   DOB: 09/19/1956, 60 y.o.   MRN: [id number] Subjective:  The patient is alert and pleasant. He looks well. He is in no apparent distress. I spoke with his wife.  Objective: Vital signs in last 24 hours: Temp:  [97.5 F (36.4 C)-98.5 F (36.9 C)] 98.5 F (36.9 C) (08/03 0930) Pulse Rate:  [33-90] 90 (08/03 0930) Resp:  [9-21] 20 (08/03 0930) BP: (106-130)/(60-101) 111/64 (08/03 0930) SpO2:  [92 %-100 %] 92 % (08/03 0930) Weight:  [77.9 kg (171 lb 12.8 oz)] 77.9 kg (171 lb 12.8 oz) (08/02 2014)  Intake/Output from previous day: 08/02 0701 - 08/03 0700 In: 2150 [P.O.:100; I.V.:1800; IV Piggyback:250] Out: 1990 [id number]; Blood:250] Intake/Output this shift: Total I/O In: 240 [P.O.:240] Out: -   Physical exam the patient is alert and pleasant. His strength is grossly normal in his lower extremities.  Lab Results:  Recent Labs  06/03/16 0531  WBC 10.0  HGB 10.5*  HCT 32.2*  PLT 189   BMET  Recent Labs  06/03/16 0531  NA 137  K 4.4  CL 102  CO2 26  GLUCOSE 125*  BUN 17  CREATININE 1.18  CALCIUM 8.8*    Studies/Results: Dg Lumbar Spine 2-3 Views  Result Date: 06/02/2016 CLINICAL DATA:  Intraoperative localization for PLIF at L3-4, L4-5, and L5-S1. EXAM: LUMBAR SPINE - 2-3 VIEW COMPARISON:  Preoperative CT myelogram 04/08/2016 FINDINGS: Two lateral fluoroscopic spot images of the lumbar spine obtained. Images demonstrate surgical instruments localizing posterior to L5. IMPRESSION: Intraoperative localization with surgical instruments localize posterior to L5. Electronically Signed   By: Jeb Levering M.D.   On: 06/02/2016 18:03   Dg Lumbar Spine 2-3 Views  Result Date: 06/02/2016 CLINICAL DATA:  L3-S1 PLIF EXAM: LUMBAR SPINE - 2-3 VIEW; DG C-ARM 61-120 MIN FLUOROSCOPY TIME:  41 seconds COMPARISON:  CT lumbar spine dated 04/08/2016 FINDINGS: Intraoperative frontal and lateral fluoroscopic images during L3-S1 PLIF. IMPRESSION:  Intraoperative fluoroscopic images, as above. Electronically Signed   By: Julian Hy M.D.   On: 06/02/2016 17:55   Dg C-arm 61-120 Min  Result Date: 06/02/2016 CLINICAL DATA:  L3-S1 PLIF EXAM: LUMBAR SPINE - 2-3 VIEW; DG C-ARM 61-120 MIN FLUOROSCOPY TIME:  41 seconds COMPARISON:  CT lumbar spine dated 04/08/2016 FINDINGS: Intraoperative frontal and lateral fluoroscopic images during L3-S1 PLIF. IMPRESSION: Intraoperative fluoroscopic images, as above. Electronically Signed   By: Julian Hy M.D.   On: 06/02/2016 17:55    Assessment/Plan: Postop day #1: The patient is doing well. We will mobilize him with PET. He will likely go home in a few days.  LOS: 1 day     Garrett Woods D 06/03/2016, 11:08 AM

## 2016-06-04 MED ORDER — POLYETHYLENE GLYCOL 3350 17 G PO PACK
17.0000 g | PACK | Freq: Every day | ORAL | Status: DC
  Administered 2016-06-04 – 2016-06-05 (×2): 17 g via ORAL
  Filled 2016-06-04 (×2): qty 1

## 2016-06-04 NOTE — Progress Notes (Signed)
Occupational Therapy Evaluation Patient Details Name: Garrett Woods MRN: [id number] DOB: [birth year] Today's Date: 06/04/2016    History of Present Illness 60 y.o. male admitted to [facility] on 06/02/16 for elective L3-S1 decompression and fusion.  Pt with significant PMHx of pacemaker, myasthenia gravis, HTN, gout, R knee ACL repair (1988), and skin/prostate CA.   Clinical Impression   PTA, pt was independent with ADLs and mobility. Evaluation limited by pt having 8/10 pain. Pt completed bed mobility with mod assist. Educated on back precautions and brace wear protocol. Pt will benefit from continued acute OT to increase independence and safety with ADLs and mobility to allow for safe discharge home. Pt plans to d/c home with 24/7 assistance from his wife initially for the first week. Recommend 3in1 for home use and no OT follow up.    Follow Up Recommendations  No OT follow up;Supervision/Assistance - 24 hour    Equipment Recommendations  3 in 1 bedside comode;Other (comment) (Adaptive equipment - pt to purchase if needed)    Recommendations for Other Services       Precautions / Restrictions Precautions Precautions: Back Precaution Booklet Issued: No Precaution Comments: Pt able to recall 3/3 back precautions at beginning of session. Required Braces or Orthoses: Spinal Brace Spinal Brace: Lumbar corset;Applied in sitting position Restrictions Weight Bearing Restrictions: No      Mobility Bed Mobility Overal bed mobility: Needs Assistance Bed Mobility: Rolling;Sidelying to Sit;Sit to Sidelying Rolling: Min assist Sidelying to sit: Mod assist       General bed mobility comments: Mod assist for trunk support and to move BLE off bed. VCs for log roll technique and hand placement.  Transfers                 General transfer comment: Not attempted due to increased pain    Balance Overall balance assessment: Needs assistance Sitting-balance support: No upper extremity  supported;Feet supported Sitting balance-[name]: Fair                                      ADL Overall ADL's : Needs assistance/impaired Eating/Feeding: Set up;Sitting   Grooming: Wash/dry hands;Wash/dry face;Set up;Sitting                                 General ADL Comments: Educated pt on back precautions, and brace wear protocol. No family present for OT eval.     Vision Vision Assessment?: No apparent visual deficits   Perception     Praxis      Pertinent Vitals/Pain Pain Assessment: 0-10 Pain Score: 8  Pain Location: incision site Pain Descriptors / Indicators: Aching;Sore Pain Intervention(s): Limited activity within patient's tolerance;Monitored during session;Premedicated before session;Repositioned;Ice applied     Hand Dominance Right   Extremity/Trunk Assessment Upper Extremity Assessment Upper Extremity Assessment: Overall WFL for tasks assessed   Lower Extremity Assessment Lower Extremity Assessment: Defer to PT evaluation   Cervical / Trunk Assessment Cervical / Trunk Assessment: Other exceptions Cervical / Trunk Exceptions: s/p lumbar surgery   Communication Communication Communication: No difficulties   Cognition Arousal/Alertness: Awake/alert Behavior During Therapy: WFL for tasks assessed/performed Overall Cognitive Status: Within Functional Limits for tasks assessed                     General Comments       Exercises  Shoulder Instructions      Home Living Family/patient expects to be discharged to:: Private residence Living Arrangements: Spouse/significant other Available Help at Discharge: Family;Available 24 hours/day (for 1st week) Type of Home: House Home Access: Stairs to enter [employer] of Steps: 1 Entrance Stairs-Rails: Left Home Layout: Two level;Able to live on main level with bedroom/bathroom Alternate Level Stairs-Number of Steps: 1 Alternate Level  Stairs-Rails: None Bathroom Shower/Tub: Walk-in shower;Door         Home Equipment: Walker - 2 wheels;Bedside commode;Hand held shower head;Adaptive equipment Adaptive Equipment: Reacher;Other (Comment) (toilet aid)        Prior Functioning/Environment Level of Independence: Independent        Comments: drives, works COO full time, 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    OT Diagnosis: Generalized weakness;Acute pain   OT Problem List: Decreased strength;Decreased range of motion;Decreased activity tolerance;Impaired balance (sitting and/or standing);Decreased safety awareness;Decreased knowledge of precautions;Decreased knowledge of use of DME or AE;Obesity;Pain   OT Treatment/Interventions: Therapeutic exercise;Self-care/ADL training;DME and/or AE instruction;Therapeutic activities;Patient/family education;Balance training    OT Goals(Current goals can be found in the care plan section) Acute Rehab OT Goals Patient Stated Goal: move easier OT Goal Formulation: With patient Time For Goal Achievement: 06/18/16 Potential to Achieve Goals: Good ADL Goals Pt Will Perform Grooming: with supervision;standing Pt Will Perform Lower Body Bathing: with supervision;with adaptive equipment;sit to/from stand Pt Will Perform Lower Body Dressing: with supervision;with adaptive equipment;sit to/from stand Pt Will Transfer to Toilet: with supervision;ambulating;bedside commode Pt Will Perform Toileting - Clothing Manipulation and hygiene: with supervision;with adaptive equipment;sit to/from stand Additional ADL Goal #1: Pt will don/doff back brace independently with no verbal cues. Additional ADL Goal #2: Pt will demonstrate adherence to 3/3 back precautions with min verbal cues.  OT Frequency: Min 2X/week   Barriers to D/C:            Co-evaluation              End of Session Nurse Communication: Mobility status  Activity Tolerance: Patient limited by pain Patient left: in bed;with call  bell/phone within reach;with bed alarm set   Time: [id number] OT Time Calculation (min): 17 min Charges:  OT General Charges $OT Visit: 1 Procedure OT Evaluation $OT Eval Moderate Complexity: 1 Procedure G-Codes:    Redmond Baseman, OTR/L [phone] 06/04/2016, 4:09 PM

## 2016-06-04 NOTE — Progress Notes (Signed)
Subjective: Patient reports Patient's feeling a little more sore in his back today no leg pain no new numbness or tingling  Objective: Vital signs in last 24 hours: Temp:  [98.2 F (36.8 C)-101.7 F (38.7 C)] 101.7 F (38.7 C) (08/04 1338) Pulse Rate:  [84-98] 95 (08/04 1338) Resp:  [18-20] 20 (08/04 1338) BP: (106-124)/(57-68) 115/59 (08/04 1338) SpO2:  [92 %-96 %] 95 % (08/04 1338)  Intake/Output from previous day: 08/03 0701 - 08/04 0700 In: 240 [P.O.:240] Out: 1050 [Urine:1050] Intake/Output this shift: No intake/output data recorded.  Awake alert strength 5 out of 5 wound clean dry and intact  Lab Results:  Recent Labs  06/03/16 0531  WBC 10.0  HGB 10.5*  HCT 32.2*  PLT 189   BMET  Recent Labs  06/03/16 0531  NA 137  K 4.4  CL 102  CO2 26  GLUCOSE 125*  BUN 17  CREATININE 1.18  CALCIUM 8.8*    Studies/Results: Dg Lumbar Spine 2-3 Views  Result Date: 06/02/2016 CLINICAL DATA:  Intraoperative localization for PLIF at L3-4, L4-5, and L5-S1. EXAM: LUMBAR SPINE - 2-3 VIEW COMPARISON:  Preoperative CT myelogram 04/08/2016 FINDINGS: Two lateral fluoroscopic spot images of the lumbar spine obtained. Images demonstrate surgical instruments localizing posterior to L5. IMPRESSION: Intraoperative localization with surgical instruments localize posterior to L5. Electronically Signed   By: Jeb Levering M.D.   On: 06/02/2016 18:03   Dg Lumbar Spine 2-3 Views  Result Date: 06/02/2016 CLINICAL DATA:  L3-S1 PLIF EXAM: LUMBAR SPINE - 2-3 VIEW; DG C-ARM 61-120 MIN FLUOROSCOPY TIME:  41 seconds COMPARISON:  CT lumbar spine dated 04/08/2016 FINDINGS: Intraoperative frontal and lateral fluoroscopic images during L3-S1 PLIF. IMPRESSION: Intraoperative fluoroscopic images, as above. Electronically Signed   By: Julian Hy M.D.   On: 06/02/2016 17:55   Dg C-arm 61-120 Min  Result Date: 06/02/2016 CLINICAL DATA:  L3-S1 PLIF EXAM: LUMBAR SPINE - 2-3 VIEW; DG C-ARM 61-120  MIN FLUOROSCOPY TIME:  41 seconds COMPARISON:  CT lumbar spine dated 04/08/2016 FINDINGS: Intraoperative frontal and lateral fluoroscopic images during L3-S1 PLIF. IMPRESSION: Intraoperative fluoroscopic images, as above. Electronically Signed   By: Julian Hy M.D.   On: 06/02/2016 17:55    Assessment/Plan: Continue to mobilize today with physical and occupational therapy  LOS: 2 days     Garrett Woods P 06/04/2016, 4:43 PM

## 2016-06-04 NOTE — Progress Notes (Signed)
Physical Therapy Treatment Patient Details Name: Garrett Woods MRN: [id number] DOB: 01-03-1956 Today's Date: 06/04/2016    History of Present Illness 60 y.o. male admitted to [facility] on 06/02/16 for elective L3-S1 decompression and fusion.  Pt with significant PMHx of pacemaker, myasthenia gravis, HTN, gout, R knee ACL repair (1988), and skin/prostate CA.    PT Comments    Patient is gradually progressing toward mobility goals. Tolerated gait training with min guard this session. However, pt continues to require mod/max A for bed mobility and transfers. Continue to progress as tolerated.   Follow Up Recommendations  Home health PT;Supervision for mobility/OOB     Equipment Recommendations  Rolling walker with 5" wheels;Other (comment) (bariatric)    Recommendations for Other Services OT consult     Precautions / Restrictions Precautions Precautions: Back Precaution Booklet Issued: Yes (comment) Precaution Comments: reviewed precautions  Required Braces or Orthoses: Spinal Brace Spinal Brace: Lumbar corset Restrictions Weight Bearing Restrictions: No    Mobility  Bed Mobility Overal bed mobility: Needs Assistance Bed Mobility: Rolling;Sidelying to Sit Rolling: Mod assist Sidelying to sit: Max assist       General bed mobility comments: mod A to roll with assist to flex R knee and bring L LE toward R side EOB and cues for sequencing/technique and use of rail; max A to elevate trunk into sitting with pt using rail to push up on   Transfers Overall transfer level: Needs assistance Equipment used: Rolling walker (2 wheeled) Transfers: Sit to/from Stand Sit to Stand: From elevated surface;Mod assist (his bed is high at home)         General transfer comment: mod A to power up into standing with cues for hand placement and foot placement  Ambulation/Gait Ambulation/Gait assistance: Min guard Ambulation Distance (Feet): 40 Feet Assistive device: Rolling walker (2  wheeled) Gait Pattern/deviations: Step-to pattern;Decreased step length - right;Decreased stride length;Decreased dorsiflexion - right     General Gait Details: cues for posture and proximity of RW; pt with decreased R DF which he reported he experienced PTA   Stairs            Wheelchair Mobility    Modified Rankin (Stroke Patients Only)       Balance Overall balance assessment: Needs assistance Sitting-balance support: Bilateral upper extremity supported;Feet supported Sitting balance-[name]: Fair     Standing balance support: Bilateral upper extremity supported Standing balance-[name]: Poor                      Cognition Arousal/Alertness: Awake/alert Behavior During Therapy: WFL for tasks assessed/performed Overall Cognitive Status: Within Functional Limits for tasks assessed                      Exercises      General Comments General comments (skin integrity, edema, etc.): requested bariatric 3 in 1 for pt's room      Pertinent Vitals/Pain Pain Assessment: 0-10 Pain Score: 6  Pain Location: back Pain Descriptors / Indicators: Aching;Guarding;Sore Pain Intervention(s): Limited activity within patient's tolerance;Monitored during session;Premedicated before session;Repositioned;Patient requesting pain meds-RN notified    Home Living                      Prior Function            PT Goals (current goals can now be found in the care plan section) Acute Rehab PT Goals Patient Stated Goal: move easier PT Goal Formulation:  With patient/family Time For Goal Achievement: 06/10/16 Potential to Achieve Goals: Good Progress towards PT goals: Progressing toward goals    Frequency  Min 5X/week    PT Plan Current plan remains appropriate    Co-evaluation             End of Session Equipment Utilized During Treatment: Back brace;Gait belt Activity Tolerance: Patient tolerated treatment well (limited by  lightheadedness) Patient left: in chair;with call bell/phone within reach     Time: 0856-0942 PT Time Calculation (min) (ACUTE ONLY): 46 min  Charges:  $Gait Training: 8-22 mins $Therapeutic Activity: 8-22 mins                    G Codes:      [place] April, PTA Pager: [phone]   06/04/2016, 9:57 AM

## 2016-06-05 MED ORDER — OXYCODONE-ACETAMINOPHEN 5-325 MG PO TABS: 1.0000 | ORAL_TABLET | ORAL | 0 refills | Status: DC | PRN

## 2016-06-05 MED ORDER — DIAZEPAM 5 MG PO TABS: 5.0000 mg | ORAL_TABLET | Freq: Four times a day (QID) | ORAL | 0 refills | Status: DC | PRN

## 2016-06-05 MED ORDER — DOCUSATE SODIUM 100 MG PO CAPS: 100.0000 mg | ORAL_CAPSULE | Freq: Two times a day (BID) | ORAL | 0 refills | Status: DC

## 2016-06-05 NOTE — Care Management Note (Addendum)
Case Management Note  Patient Details  Name: Garrett Woods MRN: [id number] Date of Birth: 01/23/1956  Subjective/Objective:                  Bilateral L3-4, L4-5 and L5-S1 Laminotomy/foraminotomies  Action/Plan: CM spoke to patient and spouse at the bedside regarding [place] needs. Patient chose [facility] for HHPT and for bariatric 3N1. CM called Jermaine with [facility] who will deliver to the bedside. CM called Tiffany with [facility] to advise of [place] PT need and [facility] does not take Patient's insurance carrier. CM called [facility] and [facility] accepted patient for referral, patient was agreeable with choice. Cm faxed orders along with facesheet to [facility] @ [phone]. Confirmation received. Patient has a RW at home and denies the need for any further DME. Patient denies further needs for discharge planning and is planning to go home today.   Expected Discharge Date:  06/05/16            Expected Discharge Plan:  [place]  In-House Referral:     Discharge planning Services  CM Consult  Post Acute Care Choice:  Durable Medical Equipment, Home Health Choice offered to:  Patient, Spouse  DME Arranged:  3-N-1 DME Agency:  [place]:  PT [facility] Agency:  [facility] Status of Service:  Completed, signed off  If discussed at [place] of Stay Meetings, dates discussed:    Additional Comments:  Guido Sander, RN 06/05/2016, 10:02 AM

## 2016-06-05 NOTE — Discharge Summary (Signed)
Date of Admission: 06/02/2016  Date of Discharge: 06/05/16  Preoperative diagnosis:  L3-4, L4-5 and L5-S1 Degenerative disc disease, facet arthropathy, spinal stenosis compressing  the L3, L4, L5 and S1 nerve roots; lumbago; lumbar radiculopathy  Postoperative diagnosis: The same  Procedure: Bilateral L3-4, L4-5 and L5-S1 Laminotomy/foraminotomies to decompress the bilateral L3, L4, L5 and S1 nerve roots(the work required to do this was in addition to the work required to do the posterior lumbar interbody fusion because of the patient's spinal stenosis, facet arthropathy. Etc. requiring a wide decompression of the nerve roots.); L3-4, L4-5 and L5-S1 transforaminal lumbar interbody fusion with local morselized autograft bone and Kinnex graft extender; insertion of interbody prosthesis at L3-4, L4-5 and L5-S1 (globus peek interbody prosthesis); posterior segmental instrumentation from L3 to S1 with globus titanium pedicle screws and rods; posterior lateral arthrodesis at L3-4, L4-5 and L5-S1 with local morselized autograft bone and Kinnex bone graft extender.  Attending: Newman Pies, MD  Hospital Course:  The patient was admitted for the above listed operation and had an uncomplicated post-operative course.  They were discharged in stable condition.  Follow up: 3 weeks    Medication List    TAKE these medications   aspirin EC 81 MG tablet Take 81 mg by mouth daily.   diazepam 5 MG tablet Commonly known as:  VALIUM Take 1 tablet (5 mg total) by mouth every 6 (six) hours as needed for muscle spasms.   diclofenac 50 MG tablet Commonly known as:  CATAFLAM TAKE 1 TABLET BY MOUTH TWICE A DAY AS NEEDED FOR BACK PAIN   docusate sodium 100 MG capsule Commonly known as:  COLACE Take 1 capsule (100 mg total) by mouth 2 (two) times daily.   gabapentin 600 MG tablet Commonly known as:  NEURONTIN Take 600 mg by mouth 3 (three) times daily.   Glucosamine-Chondroitin 250-200 MG Tabs Take 1  tablet by mouth daily.   losartan-hydrochlorothiazide 100-25 MG tablet Commonly known as:  HYZAAR TAKE 1 TABLET BY MOUTH DAILY   lovastatin 40 MG tablet Commonly known as:  MEVACOR Take 40 mg by mouth daily.   oxyCODONE-acetaminophen 5-325 MG tablet Commonly known as:  PERCOCET/ROXICET Take 1-2 tablets by mouth every 4 (four) hours as needed for moderate pain.

## 2016-06-05 NOTE — Progress Notes (Signed)
Physical Therapy Treatment Patient Details Name: Garrett Woods MRN: [id number] DOB: Mar 18, 1956 Today's Date: 06/05/2016    History of Present Illness 60 y.o. male admitted to [facility] on 06/02/16 for elective L3-S1 decompression and fusion.  Pt with significant PMHx of pacemaker, myasthenia gravis, HTN, gout, R knee ACL repair (1988), and skin/prostate CA.    PT Comments    Patient continues to require mod A +2 to achieve sitting from supine and min A+2 for OOB transfers. Wife present and actively participated in transfer to stand and educated on proper body mechanics to assist safely. Pt and wife report he will be sleeping in lift chair on return home. Current plan remains appropriate.   Follow Up Recommendations  Home health PT;Supervision for mobility/OOB     Equipment Recommendations  Rolling walker with 5" wheels;Other (comment)    Recommendations for Other Services OT consult     Precautions / Restrictions Precautions Precautions: Back Precaution Booklet Issued: No Precaution Comments: Pt able to recall 3/3 back precautions at beginning of session. Required Braces or Orthoses: Spinal Brace Spinal Brace: Lumbar corset;Applied in sitting position Restrictions Weight Bearing Restrictions: No    Mobility  Bed Mobility Overal bed mobility: Needs Assistance Bed Mobility: Rolling;Sidelying to Sit Rolling: Mod assist Sidelying to sit: Min assist;+2 for physical assistance       General bed mobility comments: assist to mobilitze bilat LE for roll with difficulty flexing R knee and +2 to elevate trunk into sitting with use of bed rail; cues for sequencing technique  Transfers Overall transfer level: Needs assistance Equipment used: Rolling walker (2 wheeled) Transfers: Sit to/from Stand Sit to Stand: Mod assist;From elevated surface         General transfer comment: assist to power up into standing with wife assisting therapist; cues for safe hand placement and technique  for pt and wife safety; educated on proper body mechanics to assist with transfers  Ambulation/Gait Ambulation/Gait assistance: Min guard Ambulation Distance (Feet): 75 Feet Assistive device: Rolling walker (2 wheeled) Gait Pattern/deviations: Step-through pattern;Decreased step length - left;Decreased stance time - right;Decreased weight shift to right;Antalgic     General Gait Details: cues for posture and bilat step length   Stairs         General stair comments: educated pt and wife on safest management of step to enter home; not practiced but pt and wife verbalized understanding  Wheelchair Mobility    Modified Rankin (Stroke Patients Only)       Balance Overall balance assessment: Needs assistance Sitting-balance support: No upper extremity supported;Feet supported Sitting balance-[name]: Fair     Standing balance support: Bilateral upper extremity supported Standing balance-[name]: Poor                      Cognition Arousal/Alertness: Awake/alert Behavior During Therapy: WFL for tasks assessed/performed Overall Cognitive Status: Within Functional Limits for tasks assessed                      Exercises      General Comments        Pertinent Vitals/Pain Pain Assessment: 0-10 Pain Score: 3  Pain Location: back Pain Descriptors / Indicators: Aching;Sore Pain Intervention(s): Limited activity within patient's tolerance;Monitored during session;Premedicated before session;Repositioned    Home Living                      Prior Function  PT Goals (current goals can now be found in the care plan section) Acute Rehab PT Goals Patient Stated Goal: move easier Progress towards PT goals: Progressing toward goals    Frequency  Min 5X/week    PT Plan Current plan remains appropriate    Co-evaluation PT/OT/SLP Co-Evaluation/Treatment: Yes Reason for Co-Treatment: For patient/therapist safety PT goals  addressed during session: Mobility/safety with mobility;Proper use of DME OT goals addressed during session: ADL's and self-care;Proper use of Adaptive equipment and DME     End of Session Equipment Utilized During Treatment: Back brace;Gait belt Activity Tolerance: Patient tolerated treatment well Patient left: in chair;with call bell/phone within reach;with family/visitor present     Time: 1404-1430 PT Time Calculation (min) (ACUTE ONLY): 26 min  Charges:  $Gait Training: 8-22 mins                    G Codes:      Salina April, PTA Pager: [phone]   06/05/2016, 4:49 PM

## 2016-06-05 NOTE — Progress Notes (Deleted)
Date of Admission: 06/02/2016  Date of Discharge: 06/05/16  Preoperative diagnosis:  L3-4, L4-5 and L5-S1 Degenerative disc disease, facet arthropathy, spinal stenosis compressing  the L3, L4, L5 and S1 nerve roots; lumbago; lumbar radiculopathy  Postoperative diagnosis: The same  Procedure: Bilateral L3-4, L4-5 and L5-S1 Laminotomy/foraminotomies to decompress the bilateral L3, L4, L5 and S1 nerve roots(the work required to do this was in addition to the work required to do the posterior lumbar interbody fusion because of the patient's spinal stenosis, facet arthropathy. Etc. requiring a wide decompression of the nerve roots.); L3-4, L4-5 and L5-S1 transforaminal lumbar interbody fusion with local morselized autograft bone and Kinnex graft extender; insertion of interbody prosthesis at L3-4, L4-5 and L5-S1 (globus peek interbody prosthesis); posterior segmental instrumentation from L3 to S1 with globus titanium pedicle screws and rods; posterior lateral arthrodesis at L3-4, L4-5 and L5-S1 with local morselized autograft bone and Kinnex bone graft extender.  Attending: Newman Pies, MD  Hospital Course:  The patient was admitted for the above listed operation and had an uncomplicated post-operative course.  They were discharged in stable condition.  Follow up: 3 weeks    Medication List    TAKE these medications   aspirin EC 81 MG tablet Take 81 mg by mouth daily.   diazepam 5 MG tablet Commonly known as:  VALIUM Take 1 tablet (5 mg total) by mouth every 6 (six) hours as needed for muscle spasms.   diclofenac 50 MG tablet Commonly known as:  CATAFLAM TAKE 1 TABLET BY MOUTH TWICE A DAY AS NEEDED FOR BACK PAIN   docusate sodium 100 MG capsule Commonly known as:  COLACE Take 1 capsule (100 mg total) by mouth 2 (two) times daily.   gabapentin 600 MG tablet Commonly known as:  NEURONTIN Take 600 mg by mouth 3 (three) times daily.   Glucosamine-Chondroitin 250-200 MG Tabs Take 1  tablet by mouth daily.   losartan-hydrochlorothiazide 100-25 MG tablet Commonly known as:  HYZAAR TAKE 1 TABLET BY MOUTH DAILY   lovastatin 40 MG tablet Commonly known as:  MEVACOR Take 40 mg by mouth daily.   oxyCODONE-acetaminophen 5-325 MG tablet Commonly known as:  PERCOCET/ROXICET Take 1-2 tablets by mouth every 4 (four) hours as needed for moderate pain.

## 2016-06-05 NOTE — Progress Notes (Signed)
No acute events Moving legs well Cleared by therapy Patient may go home this afternoon but is a little bit anxious about going home right now.

## 2016-06-05 NOTE — Progress Notes (Signed)
Patient is being discharged home with home health PT. Discharge instructions were given to patient and family. Instructions such as wound care, infections, pain management and when to call the  MD were discussed.

## 2016-06-05 NOTE — Progress Notes (Signed)
Occupational Therapy Treatment Patient Details Name: Garrett Woods MRN: [id number] DOB: [birth year] Today's Date: 06/05/2016    History of present illness 60 y.o. male admitted to [facility] on 06/02/16 for elective L3-S1 decompression and fusion.  Pt with significant PMHx of pacemaker, myasthenia gravis, HTN, gout, R knee ACL repair (1988), and skin/prostate CA.   OT comments  Pt progressing slowly, but steadily towards OT goals. Pt's bed mobility performance continues to be limited and advised pt to sleep in reclining lift chair initially until bed mobility performance improves. Pt required mod assist for basic transfers and most ADLs which his wife can provide for first week. Advised pt's wife to set pt up in the morning with all necessary items within nearby distance to reduce fall risk. Reviewed car transfer as pt reported he would be leaving later today. Will continue to follow acutely.   Follow Up Recommendations  No OT follow up;Supervision/Assistance - 24 hour    Equipment Recommendations  3 in 1 bedside comode;Other (comment)    Recommendations for Other Services      Precautions / Restrictions Precautions Precautions: Back Precaution Booklet Issued: No Precaution Comments: Pt able to recall 3/3 back precautions at beginning of session. Required Braces or Orthoses: Spinal Brace Spinal Brace: Lumbar corset;Applied in sitting position Restrictions Weight Bearing Restrictions: No       Mobility Bed Mobility Overal bed mobility: Needs Assistance Bed Mobility: Rolling;Sidelying to Sit Rolling: Mod assist Sidelying to sit: Min assist;+2 for physical assistance       General bed mobility comments: Assist to complete full roll onto R side and +2 assist to come to sitting position. Advised pt to sleep in lift chair initially due to poor bed mobility performance and his wife is unable to provide necessary level of physical assistance. VCs for hand placement and log roll  technique.  Transfers Overall transfer level: Needs assistance Equipment used: Rolling walker (2 wheeled) Transfers: Sit to/from Stand Sit to Stand: Mod assist;From elevated surface         General transfer comment: Pt's wife assisted with therapist guarding on opposite side. Mod assist for boost to stand. VCs for back precautions and safe hand placement.     Balance Overall balance assessment: Needs assistance Sitting-balance support: No upper extremity supported;Feet supported Sitting balance-[name]: Fair     Standing balance support: Bilateral upper extremity supported;During functional activity Standing balance-[name]: Poor                     ADL Overall ADL's : Needs assistance/impaired         Upper Body Bathing: Min guard;Sitting;With adaptive equipment   Lower Body Bathing: Moderate assistance;Sit to/from stand Lower Body Bathing Details (indicate cue type and reason): has long-handled brush Upper Body Dressing : Min guard;Sitting   Lower Body Dressing: Moderate assistance;Sit to/from stand;With caregiver independent assisting   Toilet Transfer: Moderate assistance;With caregiver independent assisting;Ambulation;BSC;RW   Toileting- Clothing Manipulation and Hygiene: Moderate assistance;With adaptive equipment;Sit to/from stand Toileting - Clothing Manipulation Details (indicate cue type and reason): has toilet aid     Functional mobility during ADLs: Moderate assistance;Rolling walker General ADL Comments: Had wife assist with all basic transfers this session and provided cues for hand placement and proper body mechanics      Vision                     Perception     Praxis      Cognition  Behavior During Therapy: WFL for tasks assessed/performed Overall Cognitive Status: Within Functional Limits for tasks assessed                       Extremity/Trunk Assessment               Exercises     Shoulder  Instructions       General Comments      Pertinent Vitals/ Pain       Pain Assessment: 0-10 Pain Score: 3  Pain Location: back Pain Descriptors / Indicators: Aching;Sore Pain Intervention(s): Limited activity within patient's tolerance;Monitored during session;Premedicated before session;Repositioned  Home Living                                          Prior Functioning/Environment              Frequency Min 2X/week     Progress Toward Goals  OT Goals(current goals can now be found in the care plan section)  Progress towards OT goals: Progressing toward goals  Acute Rehab OT Goals Patient Stated Goal: move easier OT Goal Formulation: With patient Time For Goal Achievement: 06/18/16 Potential to Achieve Goals: Good ADL Goals Pt Will Perform Grooming: with supervision;standing Pt Will Perform Lower Body Bathing: with supervision;with adaptive equipment;sit to/from stand Pt Will Perform Lower Body Dressing: with supervision;with adaptive equipment;sit to/from stand Pt Will Transfer to Toilet: with supervision;ambulating;bedside commode Pt Will Perform Toileting - Clothing Manipulation and hygiene: with supervision;with adaptive equipment;sit to/from stand Additional ADL Goal #1: Pt will don/doff back brace independently with no verbal cues. Additional ADL Goal #2: Pt will demonstrate adherence to 3/3 back precautions with min verbal cues.  Plan Discharge plan remains appropriate    Co-evaluation    PT/OT/SLP Co-Evaluation/Treatment: Yes Reason for Co-Treatment: For patient/therapist safety   OT goals addressed during session: ADL's and self-care;Proper use of Adaptive equipment and DME      End of Session Equipment Utilized During Treatment: Gait belt;Rolling walker;Back brace   Activity Tolerance Patient tolerated treatment well   Patient Left in chair;with call bell/phone within reach;with family/visitor present   Nurse Communication  Mobility status        Time: 1404-1430 OT Time Calculation (min): 26 min  Charges: OT General Charges $OT Visit: 1 Procedure OT Treatments $Self Care/Home Management : 8-22 mins  Redmond Baseman, OTR/L Pager: [phone] 06/05/2016, 3:18 PM

## 2016-06-05 NOTE — Progress Notes (Signed)
PT Cancellation Note  Patient Details Name: Garrett Woods MRN: [id number] DOB: [birth year]   Cancelled Treatment:    Reason Eval/Treat Not Completed: Patient declined, no reason specifiedPt reported just getting back to bed to use restroom and wants to rest a little while. PT will check on pt later as time allows.    Salina April, PTA Pager: [phone]   06/05/2016, 10:06 AM

## 2016-06-07 NOTE — Progress Notes (Signed)
06/07/2016 at 1036: Received a phone call this am that [facility] would not be able to see the patient for [facility] services. CM spoke with Mrs Mucklow and she did not have a second choice. CM made a few phone calls and found that [facility] accepts patients insurance and the referral.  CM called Mrs Simonin back and she was in agreement to use [facility]. Mrs Koegler informed that [facility] would be calling her to set up the first visit.

## 2016-06-18 ENCOUNTER — Ambulatory Visit (HOSPITAL_COMMUNITY)
Admission: RE | Admit: 2016-06-18 | Discharge: 2016-06-18 | Disposition: A | Discharge status: Home / Self Care | Payer: Managed Care, Other (non HMO) | Source: Ambulatory Visit | Attending: Neurosurgery | Admitting: Neurosurgery

## 2016-06-18 ENCOUNTER — Other Ambulatory Visit (HOSPITAL_COMMUNITY): Payer: Self-pay | Admitting: Neurosurgery

## 2016-06-18 DIAGNOSIS — R609 Edema, unspecified: Secondary | ICD-10-CM

## 2016-06-18 DIAGNOSIS — M7989 Other specified soft tissue disorders: Secondary | ICD-10-CM | POA: Diagnosis present

## 2016-06-18 NOTE — Progress Notes (Signed)
*  Preliminary Results* Bilateral lower extremity venous duplex completed. Bilateral lower extremities are negative for deep vein thrombosis. There is no evidence of Baker's cyst bilaterally.  06/18/2016 10:23 AM Maudry Mayhew, BS, RVT, RDCS, RDMS

## 2016-06-30 ENCOUNTER — Telehealth: Payer: Self-pay

## 2016-06-30 NOTE — Telephone Encounter (Signed)
[name] from [facility] called saying that patient has had severe knee pain since yesterday. He reports that the patient had back surgery about 2-3 weeks ago by Dr. Arnoldo Morale in [place]. Since then he has had swelling in both of his lower extremities, making it somewhat difficult to ambulate. He reports that he was seen by Dr. Arnoldo Morale about 1 week ago due to the swelling, and they did a venous US to R/O a DVT. He reports that this was negative, and they recommended that he use compression socks and elevation for treatment. He reports that the patient has had swelling and pain in his right knee since yesterday afternoon. He has taken his pain medication as directed, and it has not helped. They called Dr. Arnoldo Morale office this morning and they recommended that the patient be seen by his PCP. Would you be able to work the patient in this afternoon? Patient reports that he would be able to come in, but has to wait until his wife gets off from work. Please advise. Please call cell number [phone]). Thanks!

## 2016-06-30 NOTE — Telephone Encounter (Signed)
Only time I could see him today is at 1:30. Otherwise he can 8am slot or a same day slot tomorrow.

## 2016-06-30 NOTE — Telephone Encounter (Signed)
Patient scheduled ov for 07/01/2016 at 8:00 am.

## 2016-07-01 ENCOUNTER — Ambulatory Visit: Payer: Self-pay | Admitting: Family Medicine

## 2016-07-02 ENCOUNTER — Encounter: Payer: Self-pay | Admitting: Emergency Medicine

## 2016-07-02 ENCOUNTER — Ambulatory Visit
Admission: EM | Admit: 2016-07-02 | Discharge: 2016-07-02 | Disposition: A | Discharge status: Home / Self Care | Payer: Managed Care, Other (non HMO) | Attending: Emergency Medicine | Admitting: Emergency Medicine

## 2016-07-02 ENCOUNTER — Ambulatory Visit (INDEPENDENT_AMBULATORY_CARE_PROVIDER_SITE_OTHER): Payer: Managed Care, Other (non HMO)

## 2016-07-02 DIAGNOSIS — M25461 Effusion, right knee: Secondary | ICD-10-CM

## 2016-07-02 DIAGNOSIS — M25561 Pain in right knee: Secondary | ICD-10-CM | POA: Diagnosis not present

## 2016-07-02 LAB — COMPREHENSIVE METABOLIC PANEL
ALK PHOS: 75 U/L (ref 38–126)
ALT: 15 U/L — AB (ref 17–63)
AST: 17 U/L (ref 15–41)
Albumin: 3.9 g/dL (ref 3.5–5.0)
Anion gap: 9 (ref 5–15)
BUN: 22 mg/dL — ABNORMAL HIGH (ref 6–20)
CALCIUM: 9.6 mg/dL (ref 8.9–10.3)
CO2: 26 mmol/L (ref 22–32)
CREATININE: 1.07 mg/dL (ref 0.61–1.24)
Chloride: 102 mmol/L (ref 101–111)
GFR calc non Af Amer: >60 mL/min (ref >60)
GLUCOSE: 117 mg/dL — AB (ref 65–99)
Potassium: 4.1 mmol/L (ref 3.5–5.1)
SODIUM: 137 mmol/L (ref 135–145)
Total Bilirubin: 1 mg/dL (ref 0.3–1.2)
Total Protein: 7.9 g/dL (ref 6.5–8.1)

## 2016-07-02 LAB — CBC WITH DIFFERENTIAL/PLATELET
Basophils Absolute: 0.1 10*3/uL (ref 0–0.1)
Basophils Relative: 1 %
EOS ABS: 0.2 10*3/uL (ref 0–0.7)
Eosinophils Relative: 2 %
HEMATOCRIT: 33.2 % — AB (ref 40.0–52.0)
HEMOGLOBIN: 11.3 g/dL — AB (ref 13.0–18.0)
LYMPHS ABS: 0.8 10*3/uL — AB (ref 1.0–3.6)
LYMPHS PCT: 8 %
MCH: 31.1 pg (ref 26.0–34.0)
MCHC: 33.9 g/dL (ref 32.0–36.0)
MCV: 91.6 fL (ref 80.0–100.0)
Monocytes Absolute: 1.2 10*3/uL — ABNORMAL HIGH (ref 0.2–1.0)
Monocytes Relative: 12 %
NEUTROS ABS: 8.2 10*3/uL — AB (ref 1.4–6.5)
NEUTROS PCT: 77 %
Platelets: 235 10*3/uL (ref 150–440)
RBC: 3.63 MIL/uL — AB (ref 4.40–5.90)
RDW: 14.6 % — ABNORMAL HIGH (ref 11.5–14.5)
WBC: 10.5 10*3/uL (ref 3.8–10.6)

## 2016-07-02 LAB — URIC ACID: Uric Acid, Serum: 8.2 mg/dL — ABNORMAL HIGH (ref 4.4–7.6)

## 2016-07-02 MED ORDER — DICLOFENAC SODIUM 75 MG PO TBEC: 75.0000 mg | DELAYED_RELEASE_TABLET | Freq: Two times a day (BID) | ORAL | 0 refills | Status: DC

## 2016-07-02 NOTE — Discharge Instructions (Signed)
Take medication as prescribed. Rest. Drink plenty of fluids. Elevate and apply ice. Use walker.   Follow up with orthopedic this week as discussed.   Follow up with your primary care physician this week as needed. Return to Urgent care or ER for fevers, calf pain, increased pain, decreased range of motion, new or worsening concerns.

## 2016-07-02 NOTE — ED Triage Notes (Signed)
Patient c/o pain in his right knee for the past 3 days.  Patient denies injury.

## 2016-07-02 NOTE — ED Provider Notes (Signed)
[facility] ____________________________________________  Time seen: Approximately 1:29 PM  I have reviewed the triage vital signs and the nursing notes.   HISTORY  Chief Complaint Knee Pain   HPI Garrett Woods is a 60 y.o. male presents with wife at bedside for the complaints of right knee pain 3 days. Patient reports right knee pain gradually presented but reports has also gradually improved since yesterday. Patient reports pain is primarily in the anterior knee. Patient reports he is also having swelling in his knee. Patient reports he was having moderate to severe pain yesterday and his right knee when trying to change positions and ambulate, reports mild pain at this time. Patient reports since pain being present, pain is primarily with activity and minimal at rest. States pain is primarily with active movement and position changes.   Patient reports he has occasionally taken his home Percocet which does help with his pain. Patient reports he is approximate 4 weeks post lumbar fusion surgery. Patient reports he had an outpatient follow-up on 06/18/2016 and it was noted that he was having bilateral lower extremity swelling, which has continued, and reports a that time he had an outpatient ultrasound to bilateral lower extremities which was negative for deep venous thrombosis. Patient reports he is not having any knee pain at that time. Also reports bilateral lower extremity swelling has improved some, and has not worsened.   Patient does report he has had some chronic issues with his right knee with a history of torn meniscus and torn anterior cruciate ligament, and reports that he has had intermittent right knee pain for years but states this is been more severe than his previous pain. Patient reports he still has full range of motion. Denies any redness or skin changes. Denies any posterior knee pain. Denies any calf pain. Denies any increased lower swelling. Patient  reports he feels that the swelling in his bilateral lower extremities has improved. Reports he has recently been on antibiotics in which he finished a few days ago which was Keflex for his back incision site. Denies any drainage from his incision site, redness or pain. Denies current back pain. Patient reports that he is ambulating with his walker at home. Patient reports since his back surgery he is having to change positions and a fairly awkward manner and having to place more weight and strains on his knees. Denies any fall or direct injury. Patient reports he does have a history of gout but usually affects his ankles and feet.  Denies chest pain, shortness of breath, fevers, nausea, vomiting, diarrhea or other extremity pain. Patient with history of morbid obesity, 4 weeks post surgery, cardiac history, hyperlipidemia. Reports has been ambulating with walker. Patient does report that he had a blood transfusion after his back surgery as a complication of his surgery. Patient denies any history of GI bleeding, gastric or intestinal ulcer. Patient denies any rectal bleeding or blood in stool. Patient reports he tolerates NSAIDs well.  PCP : Lelon Huh, MD  Past Medical History:  Diagnosis Date  . Arthritis   . Elevated PSA   . Gout   . Heart murmur   . History of chicken pox   . Hyperlipemia   . Hypertension    Essential  . Microscopic hematuria   . Myasthenia gravis ([place])   . Pacemaker   . Prostate cancer [facility])    Radiation    Patient Active Problem List   Diagnosis Date Noted  . Lumbar stenosis with neurogenic  claudication 06/02/2016  . Lumbar spinal stenosis 08/29/2015  . Back ache 08/19/2015  . History of basal cell cancer 06/11/2015  . Prostate cancer ([place]) 06/11/2015  . Low back pain potentially associated with radiculopathy 06/11/2015  . Myasthenia gravis ([place]) 06/11/2015  . Benign essential HTN 02/09/2015  . Combined fat and carbohydrate induced hyperlipemia 12/30/2014    . Artificial cardiac pacemaker 12/30/2014  . Bradycardia 11/21/2014  . Breathlessness on exertion 11/21/2014  . 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 Carpal tunnel syndrome 05/08/2009  . Lumbago 01/09/2009  . 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 Hyperlipidemia 11/08/2006  . Obesity, morbid (more than 100 lbs over ideal weight or BMI > 40) ([place]) 07/23/2006  . Myasthenia gravis ([place]) 07/22/2003  . Essential (primary) hypertension 12/02/2000    Past Surgical History:  Procedure Laterality Date  . BACK SURGERY    . BASAL CELL CARCINOMA EXCISION  12/28/2012   Anterior neck, done by Dr. Lacinda Axon at [facility]  . CARPAL TUNNEL RELEASE Left   . COLONOSCOPY W/ POLYPECTOMY    . INSERT / REPLACE / REMOVE PACEMAKER  12/24/2014   [employer]  . KNEE SURGERY Right 1988   Tear of LCL of knee  . MELANOMA EXCISION  2013,2015   X 2; Neck & Back    Current Outpatient Rx  . Order #: [id number] Class: Historical Med  . Order #: [id number] Class: Historical Med  . Order #: [id number] Class: Print  . Order #: [id number] Class: Normal  . Order #: [id number] Class: Normal  . Order #: [id number] Class: Normal  . Order #: [id number] Class: Historical Med  . Order #: [id number] Class: Historical Med  . Order #: [id number] Class: Normal  . Order #: [id number] Class: Historical Med  . Order #: [id number] Class: Print    No current facility-administered medications for this encounter.   Current Outpatient Prescriptions:  .  cephALEXin (KEFLEX) 500 MG capsule, Take 500 mg by mouth 4 (four) times daily., Disp: , Rfl:  .  aspirin EC 81 MG tablet, Take 81 mg by mouth daily., Disp: , Rfl:  .  diazepam (VALIUM) 5 MG tablet, Take 1 tablet (5 mg total) by mouth every 6 (six) hours as needed for muscle spasms., Disp: 60 tablet, Rfl: 0 .  diclofenac (CATAFLAM) 50 MG tablet, TAKE 1 TABLET BY MOUTH TWICE A DAY AS NEEDED FOR BACK PAIN, Disp: 30 tablet, Rfl: 5 .  diclofenac (VOLTAREN) 75 MG EC tablet, Take 1  tablet (75 mg total) by mouth 2 (two) times daily., Disp: 20 tablet, Rfl: 0 .  docusate sodium (COLACE) 100 MG capsule, Take 1 capsule (100 mg total) by mouth 2 (two) times daily., Disp: 10 capsule, Rfl: 0 .  gabapentin (NEURONTIN) 600 MG tablet, Take 600 mg by mouth 3 (three) times daily., Disp: , Rfl:  .  Glucosamine-Chondroitin 250-200 MG TABS, Take 1 tablet by mouth daily., Disp: , Rfl:  .  losartan-hydrochlorothiazide (HYZAAR) 100-25 MG tablet, TAKE 1 TABLET BY MOUTH DAILY, Disp: 30 tablet, Rfl: 5 .  lovastatin (MEVACOR) 40 MG tablet, Take 40 mg by mouth daily., Disp: , Rfl:  .  oxyCODONE-acetaminophen (PERCOCET/ROXICET) 5-325 MG tablet, Take 1-2 tablets by mouth every 4 (four) hours as needed for moderate pain., Disp: 60 tablet, Rfl: 0  Allergies Review of patient's allergies indicates no known allergies.  Family History  Problem Relation Age of Onset  . Heart failure Father   . Heart attack Father   . Hypertension Mother   . Heart Problems Mother   . Heart disease Other   .  Hyperthyroidism Other   . Diabetes Other   . Cancer Neg Hx     Social History Social History  Substance Use Topics  . Smoking status: Former Smoker    Packs/day: 1.00    Years: 20.00    Types: Cigarettes    Quit date: 11/02/2003  . Smokeless tobacco: Former Systems developer  . Alcohol use 0.0 oz/week     Comment: 2 drinks every weekend    Review of Systems Constitutional: No fever/chills Eyes: No visual changes. ENT: No sore throat. Cardiovascular: Denies chest pain. Respiratory: Denies shortness of breath. Gastrointestinal: No abdominal pain.  No nausea, no vomiting.  No diarrhea.  No constipation. Genitourinary: Negative for dysuria. Musculoskeletal: As above.  Skin: Negative for rash. Neurological: Negative for headaches, focal weakness or numbness.  10-point ROS otherwise negative.  ____________________________________________   PHYSICAL EXAM:  VITAL SIGNS: ED Triage Vitals  Enc Vitals Group       BP 07/02/16 1209 128/79     Pulse Rate 07/02/16 1209 97     Resp 07/02/16 1209 18     Temp 07/02/16 1209 99 F (37.2 C)     Temp Source 07/02/16 1209 Tympanic     SpO2 07/02/16 1209 98 %     Weight 07/02/16 1210 (!) 360 lb (163.3 kg)     Height 07/02/16 1210 6' (1.829 m)     Head Circumference --      Peak Flow --      Pain Score 07/02/16 1213 3     Pain Loc --      Pain Edu? --      Excl. in [place]? --     Constitutional: Alert and oriented. Well appearing and in no acute distress. Eyes: Conjunctivae are normal. PERRL. EOMI. ENT      Head: Normocephalic and atraumatic.      Mouth/Throat: Mucous membranes are moist. Neck: No stridor. Supple without meningismus.  Cardiovascular: Normal rate, regular rhythm. Grossly normal heart sounds.  Good peripheral circulation. Respiratory: Normal respiratory effort without tachypnea nor retractions. Breath sounds are clear and equal bilaterally. No wheezes/rales/rhonchi.. Gastrointestinal: Soft and nontender. Obese abdomen. No CVA tenderness. Musculoskeletal:  Nontender with normal range of motion in all extremities. No midline cervical, thoracic or lumbar tenderness to palpation. Bilateral pedal pulses equal and easily palpated.Bilateral lower extremities with 1+ pitting edema. Right knee: Mild to moderate diffuse anterior right knee swelling noted with joint effusion, slight warmth to touch, no erythema, full range of motion, mild anterior right knee pain with resisted knee extension and resisted knee flexion, mild pain with anterior drawer test and lateral stress to anterior knee, no posterior knee tenderness, no calf tenderness noted bilaterally, no erythema, moderate anterior right knee pain with weight bearing. Skin intact. Per RN: Right Calf measurement 20.5" and Right ankle 12" Left Calf measurement 20" and Left ankle 12" Neurologic:  Normal speech and language. No gross focal neurologic deficits are appreciated. Speech is normal.  Skin:   Skin is warm, dry and intact. No rash noted. Posterior midline lumbar surgical site appears to be healing well, well approximated, no exudate or drainage, nontender, no erythema. Psychiatric: Mood and affect are normal. Speech and behavior are normal. Patient exhibits appropriate insight and judgment   ___________________________________________   LABS (all labs ordered are listed, but only abnormal results are displayed)  Labs Reviewed  CBC WITH DIFFERENTIAL/PLATELET - Abnormal; Notable for the following:       Result Value   RBC 3.63 (*)  Hemoglobin 11.3 (*)    HCT 33.2 (*)    RDW 14.6 (*)    Neutro Abs 8.2 (*)    Lymphs Abs 0.8 (*)    Monocytes Absolute 1.2 (*)    All other components within normal limits  COMPREHENSIVE METABOLIC PANEL - Abnormal; Notable for the following:    Glucose, Bld 117 (*)    BUN 22 (*)    ALT 15 (*)    All other components within normal limits  URIC ACID - Abnormal; Notable for the following:    Uric Acid, Serum 8.2 (*)    All other components within normal limits   ____________________________________________  RADIOLOGY  Dg Knee Complete 4 Views Right  Result Date: 07/02/2016 CLINICAL DATA:  Knee pain for 3 days EXAM: RIGHT KNEE - COMPLETE 4+ VIEW COMPARISON:  None FINDINGS: There is a moderate size suprapatellar joint effusion. Several loose bodies are identified within the suprapatellar joint space. Moderate-to-marked medial compartment osteoarthritis is noted. There is mild patellofemoral and lateral compartment osteoarthritis. IMPRESSION: 1. Osteoarthritis. 2. Suprapatellar joint effusion 3. Several loose bodies noted within the joint space. Electronically Signed   By: Kerby Moors M.D.   On: 07/02/2016 14:06   ____________________________________________   PROCEDURES Procedures   INITIAL IMPRESSION / ASSESSMENT AND PLAN / ED COURSE  Pertinent labs & imaging results that were available during my care of the patient were reviewed by me  and considered in my medical decision making (see chart for details).  Overall well-appearing patient. No acute distress. Wife at bedside. Presents for the complaints of 3 history of right knee pain. Patient does report some chronic history of right knee pain. Patient reports he has had bilateral lower extremity swelling which has improved since his surgeriesAnd not worsened. Denies fevers. Patient presenting for anterior right knee pain. No posterior knee pain. No calf tenderness bilaterally. Patient with noted right knee joint effusion on exam, slight warmth to touch, no erythema, full range of motion, mild pain with anterior drawer test and lateral stress. Will evaluate x-ray, CBC, BMP and uric acid. Discussed in detail with patient in regards to evaluating for with ultrasound due to patient's risk factors, however patient declines ultrasound.   Labs reviewed and discussed with patient. Discussed patient and planning care with Dr. Alphonzo Cruise who agrees with plan. Uric acid 8.2. Suspect osteoarthritis with knee effusion, possible gout pain. As swelling has improved in lower extremities from post surgery, no calf tenderness, no posterior extremity tenderness and pain fully reproducible with movement, minimal at rest, recent negative venous ultrasound, doubt DVT; discussed in detail regarding this with patient and spouse and discussed can send for outpatient ultrasound right leg, patient verbalized understanding and risks and declined ultrasound. Patient reports that he will closely monitor symptoms and if he has any increased swelling or pain he will have outpatient ultrasound. Patient has home pain percocet medication as needed. Will place patient on oral diclofenac. Counsel regarding monitoring for symptoms of bleeding. Encouraged elevation and rest and close monitoring. Recommend orthopedic follow-up this week. Discussed strict follow-up and return parameters.  Discussed follow up with Primary care  physician this week. Discussed follow up and return parameters including no resolution or any worsening concerns. Patient verbalized understanding and agreed to plan.   ____________________________________________   FINAL CLINICAL IMPRESSION(S) / ED DIAGNOSES  Final diagnoses:  Right knee pain  Effusion of right knee     Discharge Medication List as of 07/02/2016  2:32 PM    START taking these  medications   Details  diclofenac (VOLTAREN) 75 MG EC tablet Take 1 tablet (75 mg total) by mouth 2 (two) times daily., Starting Fri 07/02/2016, Normal        Note: This dictation was prepared with Dragon dictation along with smaller phrase technology. Any transcriptional errors that result from this process are unintentional.    Clinical Course      Marylene Land, NP 07/02/16 1712

## 2016-07-02 NOTE — ED Notes (Signed)
Right Calf measurement 20.5" and Right ankle 12" Left Calf measurement 20" and Left ankle 12"

## 2016-08-10 ENCOUNTER — Other Ambulatory Visit: Payer: Self-pay | Admitting: Orthopedic Surgery

## 2016-08-10 DIAGNOSIS — M1711 Unilateral primary osteoarthritis, right knee: Secondary | ICD-10-CM

## 2016-08-18 ENCOUNTER — Ambulatory Visit
Admission: RE | Admit: 2016-08-18 | Discharge: 2016-08-18 | Disposition: A | Discharge status: Home / Self Care | Payer: 59 | Source: Ambulatory Visit | Attending: Orthopedic Surgery | Admitting: Orthopedic Surgery

## 2016-08-18 DIAGNOSIS — R937 Abnormal findings on diagnostic imaging of other parts of musculoskeletal system: Secondary | ICD-10-CM | POA: Diagnosis not present

## 2016-08-18 DIAGNOSIS — M19071 Primary osteoarthritis, right ankle and foot: Secondary | ICD-10-CM | POA: Diagnosis not present

## 2016-08-18 DIAGNOSIS — M25461 Effusion, right knee: Secondary | ICD-10-CM | POA: Insufficient documentation

## 2016-08-18 DIAGNOSIS — M1711 Unilateral primary osteoarthritis, right knee: Secondary | ICD-10-CM | POA: Insufficient documentation

## 2016-09-22 ENCOUNTER — Other Ambulatory Visit: Payer: Self-pay | Admitting: Family Medicine

## 2016-09-22 ENCOUNTER — Encounter
Admission: RE | Admit: 2016-09-22 | Discharge: 2016-09-22 | Disposition: A | Discharge status: Home / Self Care | Payer: 59 | Source: Ambulatory Visit | Attending: Orthopedic Surgery | Admitting: Orthopedic Surgery

## 2016-09-22 DIAGNOSIS — Z01818 Encounter for other preprocedural examination: Secondary | ICD-10-CM | POA: Insufficient documentation

## 2016-09-22 DIAGNOSIS — M1711 Unilateral primary osteoarthritis, right knee: Secondary | ICD-10-CM | POA: Diagnosis not present

## 2016-09-22 DIAGNOSIS — Z01812 Encounter for preprocedural laboratory examination: Secondary | ICD-10-CM | POA: Diagnosis not present

## 2016-09-22 DIAGNOSIS — Z0183 Encounter for blood typing: Secondary | ICD-10-CM | POA: Diagnosis not present

## 2016-09-22 LAB — CBC
HEMATOCRIT: 40 % (ref 40.0–52.0)
HEMOGLOBIN: 13.8 g/dL (ref 13.0–18.0)
MCH: 29.8 pg (ref 26.0–34.0)
MCHC: 34.5 g/dL (ref 32.0–36.0)
MCV: 86.3 fL (ref 80.0–100.0)
Platelets: 251 10*3/uL (ref 150–440)
RBC: 4.64 MIL/uL (ref 4.40–5.90)
RDW: 16.7 % — AB (ref 11.5–14.5)
WBC: 10.7 10*3/uL — ABNORMAL HIGH (ref 3.8–10.6)

## 2016-09-22 LAB — SEDIMENTATION RATE: SED RATE: 5 mm/h (ref 0–20)

## 2016-09-22 LAB — URINALYSIS COMPLETE WITH MICROSCOPIC (ARMC ONLY)
BILIRUBIN URINE: NEGATIVE
Bacteria, UA: NONE SEEN
GLUCOSE, UA: NEGATIVE mg/dL
Hgb urine dipstick: NEGATIVE
KETONES UR: NEGATIVE mg/dL
Leukocytes, UA: NEGATIVE
NITRITE: NEGATIVE
Protein, ur: NEGATIVE mg/dL
SPECIFIC GRAVITY, URINE: 1.015 (ref 1.005–1.030)
Squamous Epithelial / LPF: NONE SEEN
pH: 6 (ref 5.0–8.0)

## 2016-09-22 LAB — BASIC METABOLIC PANEL
Anion gap: 11 (ref 5–15)
BUN: 19 mg/dL (ref 6–20)
CALCIUM: 10.1 mg/dL (ref 8.9–10.3)
CHLORIDE: 103 mmol/L (ref 101–111)
CO2: 23 mmol/L (ref 22–32)
CREATININE: 0.94 mg/dL (ref 0.61–1.24)
GFR calc Af Amer: >60 mL/min (ref >60)
GFR calc non Af Amer: >60 mL/min (ref >60)
Glucose, Bld: 134 mg/dL — ABNORMAL HIGH (ref 65–99)
Potassium: 3.8 mmol/L (ref 3.5–5.1)
SODIUM: 137 mmol/L (ref 135–145)

## 2016-09-22 LAB — URINE DRUG SCREEN, QUALITATIVE (ARMC ONLY)
Amphetamines, Ur Screen: NOT DETECTED
BARBITURATES, UR SCREEN: NOT DETECTED
BENZODIAZEPINE, UR SCRN: POSITIVE — AB
CANNABINOID 50 NG, UR ~~LOC~~: NOT DETECTED
Cocaine Metabolite,Ur ~~LOC~~: NOT DETECTED
MDMA (Ecstasy)Ur Screen: NOT DETECTED
Methadone Scn, Ur: NOT DETECTED
Opiate, Ur Screen: NOT DETECTED
Phencyclidine (PCP) Ur S: NOT DETECTED
TRICYCLIC, UR SCREEN: NOT DETECTED

## 2016-09-22 LAB — PROTIME-INR
INR: 1.05
Prothrombin Time: 13.7 seconds (ref 11.4–15.2)

## 2016-09-22 LAB — SURGICAL PCR SCREEN
MRSA, PCR: POSITIVE — AB
Staphylococcus aureus: POSITIVE — AB

## 2016-09-22 LAB — TYPE AND SCREEN
ABO/RH(D): O POS
ANTIBODY SCREEN: NEGATIVE

## 2016-09-22 LAB — APTT: aPTT: 34 seconds (ref 24–36)

## 2016-09-22 NOTE — Patient Instructions (Signed)
Your procedure is scheduled on: Tuesday 10/05/16 Report to [place]. 2ND FLOOR MEDICAL MALL ENTRANCE. To find out your arrival time please call [phone] between 1PM - 3PM on Monday 10/04/16.  Remember: Instructions that are not followed completely may result in serious medical risk, up to and including death, or upon the discretion of your surgeon and anesthesiologist your surgery may need to be rescheduled.    __X__ 1. Do not eat food or drink liquids after midnight. No gum chewing or hard candies.     __X__ 2. No Alcohol for 24 hours before or after surgery.   ____ 3. Bring all medications with you on the day of surgery if instructed.    __X__ 4. Notify your doctor if there is any change in your medical condition     (cold, fever, infections).             ___X__5. No smoking within 24 hours of your surgery.     Do not wear jewelry, make-up, hairpins, clips or nail polish.  Do not wear lotions, powders, or perfumes.   Do not shave 48 hours prior to surgery. Men may shave face and neck.  Do not bring valuables to the hospital.    [facility] is not responsible for any belongings or valuables.               Contacts, dentures or bridgework may not be worn into surgery.  Leave your suitcase in the car. After surgery it may be brought to your room.  For patients admitted to the hospital, discharge time is determined by your                treatment team.   Patients discharged the day of surgery will not be allowed to drive home.   Please read over the following fact sheets that you were given:   Pain Booklet and MRSA Information   __X__ Take these medicines the morning of surgery with A SIP OF WATER:    1. LOVASTATIN  2.   3.   4.  5.  6.  ____ Fleet Enema (as directed)   __X__ Use CHG Soap as directed  ____ Use inhalers on the day of surgery  ____ Stop metformin 2 days prior to surgery    ____ Take 1/2 of usual insulin dose the night before surgery and none on the  morning of surgery.   ____ Stop Coumadin/Plavix/aspirin on   __X__ Stop Anti-inflammatories such as Advil, Aleve, Ibuprofen, Motrin, Naproxen, Naprosyn, Goodies,powder, or aspirin products.  OK to take Tylenol.   ____ Stop supplements until after surgery.    ____ Bring C-Pap to the hospital.

## 2016-09-23 LAB — URINE CULTURE: Culture: NO GROWTH

## 2016-10-05 ENCOUNTER — Inpatient Hospital Stay
Admission: RE | Admit: 2016-10-05 | Discharge: 2016-10-08 | DRG: 470 | Disposition: A | Discharge status: Home / Self Care | Payer: 59 | Source: Ambulatory Visit | Attending: Orthopedic Surgery | Admitting: Orthopedic Surgery

## 2016-10-05 ENCOUNTER — Encounter: Payer: Self-pay | Admitting: *Deleted

## 2016-10-05 ENCOUNTER — Inpatient Hospital Stay: Payer: 59 | Admitting: Anesthesiology

## 2016-10-05 ENCOUNTER — Encounter
Admission: RE | Disposition: A | Discharge status: Home / Self Care | Payer: Self-pay | Source: Ambulatory Visit | Attending: Orthopedic Surgery

## 2016-10-05 ENCOUNTER — Inpatient Hospital Stay: Payer: 59

## 2016-10-05 DIAGNOSIS — R262 Difficulty in walking, not elsewhere classified: Secondary | ICD-10-CM

## 2016-10-05 DIAGNOSIS — D62 Acute posthemorrhagic anemia: Secondary | ICD-10-CM | POA: Diagnosis not present

## 2016-10-05 DIAGNOSIS — M6281 Muscle weakness (generalized): Secondary | ICD-10-CM

## 2016-10-05 DIAGNOSIS — Z9889 Other specified postprocedural states: Secondary | ICD-10-CM

## 2016-10-05 DIAGNOSIS — M25561 Pain in right knee: Secondary | ICD-10-CM

## 2016-10-05 DIAGNOSIS — Z8249 Family history of ischemic heart disease and other diseases of the circulatory system: Secondary | ICD-10-CM

## 2016-10-05 DIAGNOSIS — Z79899 Other long term (current) drug therapy: Secondary | ICD-10-CM | POA: Diagnosis not present

## 2016-10-05 DIAGNOSIS — M109 Gout, unspecified: Secondary | ICD-10-CM | POA: Diagnosis present

## 2016-10-05 DIAGNOSIS — Z981 Arthrodesis status: Secondary | ICD-10-CM | POA: Diagnosis not present

## 2016-10-05 DIAGNOSIS — M1711 Unilateral primary osteoarthritis, right knee: Principal | ICD-10-CM | POA: Diagnosis present

## 2016-10-05 DIAGNOSIS — G8918 Other acute postprocedural pain: Secondary | ICD-10-CM

## 2016-10-05 DIAGNOSIS — I10 Essential (primary) hypertension: Secondary | ICD-10-CM | POA: Diagnosis present

## 2016-10-05 DIAGNOSIS — Z85820 Personal history of malignant melanoma of skin: Secondary | ICD-10-CM | POA: Diagnosis not present

## 2016-10-05 DIAGNOSIS — Z6841 Body Mass Index (BMI) 40.0 and over, adult: Secondary | ICD-10-CM

## 2016-10-05 DIAGNOSIS — Z8546 Personal history of malignant neoplasm of prostate: Secondary | ICD-10-CM

## 2016-10-05 HISTORY — PX: TOTAL KNEE ARTHROPLASTY: SHX125

## 2016-10-05 LAB — CBC
HEMATOCRIT: 36.1 % — AB (ref 40.0–52.0)
Hemoglobin: 12.2 g/dL — ABNORMAL LOW (ref 13.0–18.0)
MCH: 29.7 pg (ref 26.0–34.0)
MCHC: 33.9 g/dL (ref 32.0–36.0)
MCV: 87.7 fL (ref 80.0–100.0)
Platelets: 221 10*3/uL (ref 150–440)
RBC: 4.11 MIL/uL — ABNORMAL LOW (ref 4.40–5.90)
RDW: 16.5 % — AB (ref 11.5–14.5)
WBC: 10.2 10*3/uL (ref 3.8–10.6)

## 2016-10-05 LAB — CREATININE, SERUM: CREATININE: 1.18 mg/dL (ref 0.61–1.24)

## 2016-10-05 LAB — ABO/RH: ABO/RH(D): O POS

## 2016-10-05 SURGERY — ARTHROPLASTY, KNEE, TOTAL
Anesthesia: General | Laterality: Right | Wound class: Clean Contaminated

## 2016-10-05 MED ORDER — METOCLOPRAMIDE HCL 10 MG PO TABS
5.0000 mg | ORAL_TABLET | Freq: Three times a day (TID) | ORAL | Status: DC | PRN
Start: 2016-10-05 — End: 2016-10-08

## 2016-10-05 MED ORDER — ACETAMINOPHEN 650 MG RE SUPP: 650.0000 mg | Freq: Four times a day (QID) | RECTAL | Status: DC | PRN

## 2016-10-05 MED ORDER — LOSARTAN POTASSIUM 50 MG PO TABS
100.0000 mg | ORAL_TABLET | Freq: Every day | ORAL | Status: DC
  Administered 2016-10-06 – 2016-10-08 (×3): 100 mg via ORAL
  Filled 2016-10-05 (×3): qty 2

## 2016-10-05 MED ORDER — MAGNESIUM CITRATE PO SOLN
1.0000 | Freq: Once | ORAL | Status: DC | PRN
  Filled 2016-10-05: qty 296

## 2016-10-05 MED ORDER — ACETAMINOPHEN 10 MG/ML IV SOLN
INTRAVENOUS | Status: DC | PRN
  Administered 2016-10-05: 1000 mg via INTRAVENOUS

## 2016-10-05 MED ORDER — DOCUSATE SODIUM 100 MG PO CAPS
100.0000 mg | ORAL_CAPSULE | Freq: Two times a day (BID) | ORAL | Status: DC
  Administered 2016-10-05 – 2016-10-08 (×6): 100 mg via ORAL
  Filled 2016-10-05 (×6): qty 1

## 2016-10-05 MED ORDER — NEOMYCIN-POLYMYXIN B GU 40-200000 IR SOLN
Status: DC | PRN
  Administered 2016-10-05: 12 mL

## 2016-10-05 MED ORDER — DEXTROSE 5 % IV SOLN
3.0000 g | Freq: Four times a day (QID) | INTRAVENOUS | Status: AC
  Administered 2016-10-05 – 2016-10-06 (×3): 3 g via INTRAVENOUS
  Filled 2016-10-05 (×3): qty 3000

## 2016-10-05 MED ORDER — MIDAZOLAM HCL 2 MG/2ML IJ SOLN
INTRAMUSCULAR | Status: DC | PRN
  Administered 2016-10-05: 2 mg via INTRAVENOUS

## 2016-10-05 MED ORDER — DOCUSATE SODIUM 100 MG PO CAPS: 100.0000 mg | ORAL_CAPSULE | Freq: Two times a day (BID) | ORAL | Status: DC

## 2016-10-05 MED ORDER — ZOLPIDEM TARTRATE 5 MG PO TABS
5.0000 mg | ORAL_TABLET | Freq: Every evening | ORAL | Status: DC | PRN
  Administered 2016-10-06: 5 mg via ORAL
  Filled 2016-10-05: qty 1

## 2016-10-05 MED ORDER — PROPOFOL 10 MG/ML IV BOLUS
INTRAVENOUS | Status: DC | PRN
  Administered 2016-10-05: 200 mg via INTRAVENOUS

## 2016-10-05 MED ORDER — MORPHINE SULFATE (PF) 2 MG/ML IV SOLN: 2.0000 mg | INTRAVENOUS | Status: DC | PRN

## 2016-10-05 MED ORDER — DIAZEPAM 5 MG PO TABS
5.0000 mg | ORAL_TABLET | Freq: Four times a day (QID) | ORAL | Status: DC | PRN
  Administered 2016-10-05 – 2016-10-06 (×2): 5 mg via ORAL
  Filled 2016-10-05 (×2): qty 1

## 2016-10-05 MED ORDER — CEFAZOLIN SODIUM-DEXTROSE 2-4 GM/100ML-% IV SOLN
INTRAVENOUS | Status: AC
  Filled 2016-10-05: qty 100

## 2016-10-05 MED ORDER — MORPHINE SULFATE (PF) 10 MG/ML IV SOLN
INTRAVENOUS | Status: AC
  Filled 2016-10-05: qty 1

## 2016-10-05 MED ORDER — FAMOTIDINE 20 MG PO TABS
ORAL_TABLET | ORAL | Status: AC
  Administered 2016-10-05: 20 mg via ORAL
  Filled 2016-10-05: qty 1

## 2016-10-05 MED ORDER — ONDANSETRON HCL 4 MG PO TABS: 4.0000 mg | ORAL_TABLET | Freq: Four times a day (QID) | ORAL | Status: DC | PRN

## 2016-10-05 MED ORDER — HYDROMORPHONE HCL 1 MG/ML IJ SOLN
INTRAMUSCULAR | Status: AC
  Filled 2016-10-05: qty 1

## 2016-10-05 MED ORDER — VANCOMYCIN HCL IN DEXTROSE 1-5 GM/200ML-% IV SOLN
1000.0000 mg | Freq: Once | INTRAVENOUS | Status: AC
  Administered 2016-10-05: 1000 mg via INTRAVENOUS

## 2016-10-05 MED ORDER — FENTANYL CITRATE (PF) 100 MCG/2ML IJ SOLN
INTRAMUSCULAR | Status: DC | PRN
  Administered 2016-10-05 (×3): 50 ug via INTRAVENOUS
  Administered 2016-10-05: 100 ug via INTRAVENOUS

## 2016-10-05 MED ORDER — ONDANSETRON HCL 4 MG/2ML IJ SOLN: 4.0000 mg | Freq: Once | INTRAMUSCULAR | Status: DC | PRN

## 2016-10-05 MED ORDER — PRAVASTATIN SODIUM 20 MG PO TABS
20.0000 mg | ORAL_TABLET | Freq: Every day | ORAL | Status: DC
  Administered 2016-10-06 – 2016-10-07 (×2): 20 mg via ORAL
  Filled 2016-10-05 (×2): qty 1

## 2016-10-05 MED ORDER — HYDROMORPHONE HCL 1 MG/ML IJ SOLN
0.2500 mg | INTRAMUSCULAR | Status: DC | PRN
  Administered 2016-10-05 (×2): 0.25 mg via INTRAVENOUS

## 2016-10-05 MED ORDER — FENTANYL CITRATE (PF) 100 MCG/2ML IJ SOLN
INTRAMUSCULAR | Status: AC
  Filled 2016-10-05: qty 2

## 2016-10-05 MED ORDER — NEOMYCIN-POLYMYXIN B GU 40-200000 IR SOLN
Status: AC
  Filled 2016-10-05: qty 20

## 2016-10-05 MED ORDER — CEFAZOLIN SODIUM-DEXTROSE 2-4 GM/100ML-% IV SOLN
2.0000 g | Freq: Once | INTRAVENOUS | Status: AC
  Administered 2016-10-05: 2 g via INTRAVENOUS

## 2016-10-05 MED ORDER — INFLUENZA VAC SPLIT QUAD 0.5 ML IM SUSY
0.5000 mL | PREFILLED_SYRINGE | INTRAMUSCULAR | Status: AC
  Administered 2016-10-08: 0.5 mL via INTRAMUSCULAR
  Filled 2016-10-05: qty 0.5

## 2016-10-05 MED ORDER — MENTHOL 3 MG MT LOZG
1.0000 | LOZENGE | OROMUCOSAL | Status: DC | PRN
  Filled 2016-10-05: qty 9

## 2016-10-05 MED ORDER — BISACODYL 10 MG RE SUPP: 10.0000 mg | Freq: Every day | RECTAL | Status: DC | PRN

## 2016-10-05 MED ORDER — ROCURONIUM BROMIDE 100 MG/10ML IV SOLN
INTRAVENOUS | Status: DC | PRN
  Administered 2016-10-05: 40 mg via INTRAVENOUS
  Administered 2016-10-05 (×2): 10 mg via INTRAVENOUS
  Administered 2016-10-05: 20 mg via INTRAVENOUS
  Administered 2016-10-05: 10 mg via INTRAVENOUS

## 2016-10-05 MED ORDER — LACTATED RINGERS IV SOLN
INTRAVENOUS | Status: DC
  Administered 2016-10-05 (×3): via INTRAVENOUS

## 2016-10-05 MED ORDER — BUPIVACAINE-EPINEPHRINE (PF) 0.25% -1:200000 IJ SOLN
INTRAMUSCULAR | Status: DC | PRN
  Administered 2016-10-05: 30 mL via PERINEURAL

## 2016-10-05 MED ORDER — BUPIVACAINE LIPOSOME 1.3 % IJ SUSP
INTRAMUSCULAR | Status: DC | PRN
  Administered 2016-10-05: 60 mL

## 2016-10-05 MED ORDER — METOCLOPRAMIDE HCL 5 MG/ML IJ SOLN: 5.0000 mg | Freq: Three times a day (TID) | INTRAMUSCULAR | Status: DC | PRN

## 2016-10-05 MED ORDER — DIPHENHYDRAMINE HCL 12.5 MG/5ML PO ELIX
12.5000 mg | ORAL_SOLUTION | ORAL | Status: DC | PRN
Start: 2016-10-05 — End: 2016-10-08

## 2016-10-05 MED ORDER — LOSARTAN POTASSIUM-HCTZ 100-25 MG PO TABS
1.0000 | ORAL_TABLET | Freq: Every day | ORAL | Status: DC
Start: 2016-10-05 — End: 2016-10-05

## 2016-10-05 MED ORDER — ONDANSETRON HCL 4 MG/2ML IJ SOLN: 4.0000 mg | Freq: Four times a day (QID) | INTRAMUSCULAR | Status: DC | PRN

## 2016-10-05 MED ORDER — METHOCARBAMOL 1000 MG/10ML IJ SOLN
500.0000 mg | Freq: Four times a day (QID) | INTRAVENOUS | Status: DC | PRN
  Filled 2016-10-05: qty 5

## 2016-10-05 MED ORDER — ENOXAPARIN SODIUM 30 MG/0.3ML ~~LOC~~ SOLN
30.0000 mg | Freq: Two times a day (BID) | SUBCUTANEOUS | Status: DC
  Administered 2016-10-06 – 2016-10-08 (×5): 30 mg via SUBCUTANEOUS
  Filled 2016-10-05 (×5): qty 0.3

## 2016-10-05 MED ORDER — FENTANYL CITRATE (PF) 100 MCG/2ML IJ SOLN
25.0000 ug | INTRAMUSCULAR | Status: AC | PRN
  Administered 2016-10-05 (×6): 25 ug via INTRAVENOUS

## 2016-10-05 MED ORDER — FLUOROURACIL 5 % EX CREA: 1.0000 "application " | TOPICAL_CREAM | Freq: Two times a day (BID) | CUTANEOUS | Status: DC

## 2016-10-05 MED ORDER — ACETAMINOPHEN 325 MG PO TABS: 650.0000 mg | ORAL_TABLET | Freq: Four times a day (QID) | ORAL | Status: DC | PRN

## 2016-10-05 MED ORDER — ONDANSETRON HCL 4 MG/2ML IJ SOLN
INTRAMUSCULAR | Status: DC | PRN
  Administered 2016-10-05 (×2): 4 mg via INTRAVENOUS

## 2016-10-05 MED ORDER — MAGNESIUM HYDROXIDE 400 MG/5ML PO SUSP
30.0000 mL | Freq: Every day | ORAL | Status: DC | PRN
  Administered 2016-10-06: 30 mL via ORAL
  Filled 2016-10-05: qty 30

## 2016-10-05 MED ORDER — ACETAMINOPHEN 10 MG/ML IV SOLN
INTRAVENOUS | Status: AC
  Filled 2016-10-05: qty 100

## 2016-10-05 MED ORDER — OXYCODONE HCL 5 MG PO TABS
5.0000 mg | ORAL_TABLET | ORAL | Status: DC | PRN
  Administered 2016-10-05: 10 mg via ORAL
  Administered 2016-10-05 (×3): 5 mg via ORAL
  Administered 2016-10-06 – 2016-10-07 (×10): 10 mg via ORAL
  Administered 2016-10-08 (×3): 5 mg via ORAL
  Filled 2016-10-05: qty 1
  Filled 2016-10-05 (×2): qty 2
  Filled 2016-10-05: qty 1
  Filled 2016-10-05 (×2): qty 2
  Filled 2016-10-05: qty 1
  Filled 2016-10-05 (×4): qty 2
  Filled 2016-10-05 (×2): qty 1
  Filled 2016-10-05 (×3): qty 2
  Filled 2016-10-05: qty 1

## 2016-10-05 MED ORDER — DEXAMETHASONE SODIUM PHOSPHATE 10 MG/ML IJ SOLN
INTRAMUSCULAR | Status: DC | PRN
  Administered 2016-10-05: 8 mg via INTRAVENOUS

## 2016-10-05 MED ORDER — BUPIVACAINE LIPOSOME 1.3 % IJ SUSP
INTRAMUSCULAR | Status: AC
  Filled 2016-10-05: qty 20

## 2016-10-05 MED ORDER — METHOCARBAMOL 500 MG PO TABS: 500.0000 mg | ORAL_TABLET | Freq: Four times a day (QID) | ORAL | Status: DC | PRN

## 2016-10-05 MED ORDER — SUGAMMADEX SODIUM 200 MG/2ML IV SOLN
INTRAVENOUS | Status: DC | PRN
  Administered 2016-10-05: 299.4 mg via INTRAVENOUS

## 2016-10-05 MED ORDER — MORPHINE SULFATE 10 MG/ML IJ SOLN
INTRAMUSCULAR | Status: DC | PRN
  Administered 2016-10-05: 10 mg via INTRAMUSCULAR

## 2016-10-05 MED ORDER — SODIUM CHLORIDE 0.9 % IJ SOLN
INTRAMUSCULAR | Status: AC
  Filled 2016-10-05: qty 50

## 2016-10-05 MED ORDER — FAMOTIDINE 20 MG PO TABS
20.0000 mg | ORAL_TABLET | Freq: Once | ORAL | Status: AC
  Administered 2016-10-05: 20 mg via ORAL

## 2016-10-05 MED ORDER — SODIUM CHLORIDE 0.9 % IV SOLN
INTRAVENOUS | Status: DC
  Administered 2016-10-05: 13:00:00 via INTRAVENOUS

## 2016-10-05 MED ORDER — VANCOMYCIN HCL IN DEXTROSE 1-5 GM/200ML-% IV SOLN
INTRAVENOUS | Status: AC
  Filled 2016-10-05: qty 200

## 2016-10-05 MED ORDER — SUCCINYLCHOLINE CHLORIDE 20 MG/ML IJ SOLN
INTRAMUSCULAR | Status: DC | PRN
  Administered 2016-10-05: 130 mg via INTRAVENOUS

## 2016-10-05 MED ORDER — PHENOL 1.4 % MT LIQD
1.0000 | OROMUCOSAL | Status: DC | PRN
  Filled 2016-10-05: qty 177

## 2016-10-05 MED ORDER — LIDOCAINE HCL (CARDIAC) 20 MG/ML IV SOLN
INTRAVENOUS | Status: DC | PRN
  Administered 2016-10-05: 100 mg via INTRAVENOUS

## 2016-10-05 MED ORDER — HYDROCHLOROTHIAZIDE 25 MG PO TABS
25.0000 mg | ORAL_TABLET | Freq: Every day | ORAL | Status: DC
  Administered 2016-10-06 – 2016-10-08 (×3): 25 mg via ORAL
  Filled 2016-10-05 (×3): qty 1

## 2016-10-05 MED ORDER — BUPIVACAINE-EPINEPHRINE (PF) 0.25% -1:200000 IJ SOLN
INTRAMUSCULAR | Status: AC
  Filled 2016-10-05: qty 30

## 2016-10-05 SURGICAL SUPPLY — 62 items
BANDAGE ACE 6X5 VEL STRL LF (GAUZE/BANDAGES/DRESSINGS) ×3 IMPLANT
BLADE SAW 1 (BLADE) ×3 IMPLANT
BLOCK CUTTING FEMUR 5 RT MED (MISCELLANEOUS) IMPLANT
BLOCK CUTTING TIBIAL 4 RT MIS (MISCELLANEOUS) IMPLANT
BLOCK CUTTING TIBIAL 5 RT (MISCELLANEOUS) IMPLANT
BONE CEMENT GENTAMICIN (Cement) ×6 IMPLANT
CANISTER SUCT 1200ML W/VALVE (MISCELLANEOUS) ×3 IMPLANT
CANISTER SUCT 3000ML (MISCELLANEOUS) ×6 IMPLANT
CAPT KNEE TOTAL 3 ×3 IMPLANT
CATH FOL LEG HOLDER (MISCELLANEOUS) ×3 IMPLANT
CATH TRAY METER 16FR LF (MISCELLANEOUS) ×3 IMPLANT
CEMENT BONE GENTAMICIN 40 (Cement) ×2 IMPLANT
CEMENT HV SMART SET (Cement) ×3 IMPLANT
CHLORAPREP W/TINT 26ML (MISCELLANEOUS) ×6 IMPLANT
COOLER POLAR GLACIER W/PUMP (MISCELLANEOUS) ×3 IMPLANT
CUFF TOURN 30 STER DUAL PORT (MISCELLANEOUS) ×3 IMPLANT
DRAPE INCISE IOBAN 66X45 STRL (DRAPES) ×6 IMPLANT
DRAPE SHEET LG 3/4 BI-LAMINATE (DRAPES) ×6 IMPLANT
ELECT CAUTERY BLADE 6.4 (BLADE) ×3 IMPLANT
ELECT REM PT RETURN 9FT ADLT (ELECTROSURGICAL) ×3
ELECTRODE REM PT RTRN 9FT ADLT (ELECTROSURGICAL) ×1 IMPLANT
GAUZE PETRO XEROFOAM 1X8 (MISCELLANEOUS) ×3 IMPLANT
GAUZE SPONGE 4X4 12PLY STRL (GAUZE/BANDAGES/DRESSINGS) ×3 IMPLANT
GLOVE BIOGEL PI IND STRL 9 (GLOVE) ×2 IMPLANT
GLOVE BIOGEL PI INDICATOR 9 (GLOVE) ×4
GLOVE INDICATOR 8.0 STRL GRN (GLOVE) ×3 IMPLANT
GLOVE SURG ORTHO 8.0 STRL STRW (GLOVE) ×3 IMPLANT
GLOVE SURG SYN 9.0  PF PI (GLOVE) ×4
GLOVE SURG SYN 9.0 PF PI (GLOVE) ×2 IMPLANT
GOWN SRG 2XL LVL 4 RGLN SLV (GOWNS) ×1 IMPLANT
GOWN STRL NON-REIN 2XL LVL4 (GOWNS) ×2
GOWN STRL REUS W/ TWL LRG LVL3 (GOWN DISPOSABLE) ×1 IMPLANT
GOWN STRL REUS W/ TWL XL LVL3 (GOWN DISPOSABLE) ×1 IMPLANT
GOWN STRL REUS W/TWL LRG LVL3 (GOWN DISPOSABLE) ×2
GOWN STRL REUS W/TWL XL LVL3 (GOWN DISPOSABLE) ×2
HANDPIECE INTERPULSE COAX TIP (DISPOSABLE) ×2
HOOD PEEL AWAY FLYTE STAYCOOL (MISCELLANEOUS) ×6 IMPLANT
IMMBOLIZER KNEE 19 BLUE UNIV (SOFTGOODS) ×3 IMPLANT
KIT PREVENA INCISION MGT 13 (CANNISTER) ×3 IMPLANT
KIT RM TURNOVER STRD PROC AR (KITS) ×3 IMPLANT
KNEE MEDACTA TIBIAL/FEMORAL BL (Knees) ×3 IMPLANT
KNIFE SCULPS 14X20 (INSTRUMENTS) ×3 IMPLANT
NDL SAFETY 18GX1.5 (NEEDLE) ×3 IMPLANT
NEEDLE SPNL 18GX3.5 QUINCKE PK (NEEDLE) ×3 IMPLANT
NEEDLE SPNL 20GX3.5 QUINCKE YW (NEEDLE) ×3 IMPLANT
NS IRRIG 1000ML POUR BTL (IV SOLUTION) ×3 IMPLANT
PACK TOTAL KNEE (MISCELLANEOUS) ×3 IMPLANT
PAD WRAPON POLAR KNEE (MISCELLANEOUS) ×1 IMPLANT
SET HNDPC FAN SPRY TIP SCT (DISPOSABLE) ×1 IMPLANT
SOL .9 NS 3000ML IRR  AL (IV SOLUTION) ×2
SOL .9 NS 3000ML IRR UROMATIC (IV SOLUTION) ×1 IMPLANT
STAPLER SKIN PROX 35W (STAPLE) ×3 IMPLANT
SUCTION FRAZIER HANDLE 10FR (MISCELLANEOUS) ×2
SUCTION TUBE FRAZIER 10FR DISP (MISCELLANEOUS) ×1 IMPLANT
SUT DVC 2 QUILL PDO  T11 36X36 (SUTURE) ×2
SUT DVC 2 QUILL PDO T11 36X36 (SUTURE) ×1 IMPLANT
SUT DVC QUILL MONODERM 30X30 (SUTURE) ×3 IMPLANT
SYR 20CC LL (SYRINGE) ×3 IMPLANT
SYR 50ML LL SCALE MARK (SYRINGE) ×6 IMPLANT
TOWEL OR 17X26 4PK STRL BLUE (TOWEL DISPOSABLE) ×3 IMPLANT
TOWER CARTRIDGE SMART MIX (DISPOSABLE) ×3 IMPLANT
WRAPON POLAR PAD KNEE (MISCELLANEOUS) ×3

## 2016-10-05 NOTE — NC FL2 (Signed)
[place] LEVEL OF CARE SCREENING TOOL     IDENTIFICATION  Patient Name: Garrett Woods Birthdate: [birth year] Sex: male Admission Date (Current Location): 10/05/2016  [place] and [place] Number:  Engineering geologist and Address:  [facility], [address] [place], [place] [address]      Provider Number: [id number]  Attending Physician Name and Address:  Hessie Knows, MD  Relative Name and Phone Number:       Current Level of Care: Hospital Recommended Level of Care: [place] Prior Approval Number:    Date Approved/Denied:   PASRR Number:  ([id number])  Discharge Plan: SNF    Current Diagnoses: Patient Active Problem List   Diagnosis Date Noted  . Primary osteoarthritis of right knee 10/05/2016  . Lumbar stenosis with neurogenic claudication 06/02/2016  . Lumbar spinal stenosis 08/29/2015  . Back ache 08/19/2015  . History of basal cell cancer 06/11/2015  . Prostate cancer ([place]) 06/11/2015  . Low back pain potentially associated with radiculopathy 06/11/2015  . Myasthenia gravis ([place]) 06/11/2015  . Benign essential HTN 02/09/2015  . Combined fat and carbohydrate induced hyperlipemia 12/30/2014  . Artificial cardiac pacemaker 12/30/2014  . Bradycardia 11/21/2014  . Breathlessness on exertion 11/21/2014  . 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 Carpal tunnel syndrome 05/08/2009  . Lumbago 01/09/2009  . 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 Hyperlipidemia 11/08/2006  . Obesity, morbid (more than 100 lbs over ideal weight or BMI > 40) ([place]) 07/23/2006  . Myasthenia gravis ([place]) 07/22/2003  . Essential (primary) hypertension 12/02/2000    Orientation RESPIRATION BLADDER Height & Weight     Self, Time, Situation, Place  Normal Continent Weight: (!) 330 lb (149.7 kg) Height:  6' (182.9 cm)  BEHAVIORAL SYMPTOMS/MOOD NEUROLOGICAL BOWEL NUTRITION STATUS   (none)  (none)  Continent Diet (Regular Diet )  AMBULATORY STATUS COMMUNICATION OF NEEDS Skin   Extensive Assist Verbally Surgical wounds, Wound Vac (Incision: Right Knee. Disposable Provena Wound Vac. )                       Personal Care Assistance Level of Assistance  Bathing, Feeding, Dressing Bathing Assistance: Limited assistance Feeding assistance: Independent Dressing Assistance: Limited assistance     Functional Limitations Info  Sight, Hearing, Speech Sight Info: Adequate Hearing Info: Adequate Speech Info: Adequate    SPECIAL CARE FACTORS FREQUENCY  PT (By licensed PT), OT (By licensed OT)     PT Frequency:  (5) OT Frequency:  (5)            Contractures      Additional Factors Info  Code Status, Allergies, Isolation Precautions Code Status Info:  (Full Code. ) Allergies Info:  (No Known Allergies. )     Isolation Precautions Info:  (MRSA Nasal Swab. )     Current Medications (10/05/2016):  This is the current hospital active medication list Current Facility-Administered Medications  Medication Dose Route Frequency Provider Last Rate Last Dose  . 0.9 %  sodium chloride infusion   Intravenous Continuous Hessie Knows, MD 75 mL/hr at 10/05/16 1255    . acetaminophen (TYLENOL) tablet 650 mg  650 mg Oral Q6H PRN Hessie Knows, MD       Or  . acetaminophen (TYLENOL) suppository 650 mg  650 mg Rectal Q6H PRN Hessie Knows, MD      . bisacodyl (DULCOLAX) suppository 10 mg  10 mg Rectal Daily PRN Hessie Knows, MD      .  ceFAZolin (ANCEF) 3 g in dextrose 5 % 50 mL IVPB  3 g Intravenous Q6H Hessie Knows, MD   3 g at 10/05/16 1500  . diazepam (VALIUM) tablet 5 mg  5 mg Oral Q6H PRN Hessie Knows, MD      . diphenhydrAMINE (BENADRYL) 12.5 MG/5ML elixir 12.5-25 mg  12.5-25 mg Oral Q4H PRN Hessie Knows, MD      . docusate sodium (COLACE) capsule 100 mg  100 mg Oral BID Hessie Knows, MD      . Derrill Memo ON 10/06/2016] enoxaparin (LOVENOX) injection 30 mg  30 mg Subcutaneous Q12H Hessie Knows, MD      . fentaNYL (SUBLIMAZE) 100 MCG/2ML injection           . fentaNYL (SUBLIMAZE) 100 MCG/2ML injection           . losartan (COZAAR) tablet 100 mg  100 mg Oral Daily Hessie Knows, MD       And  . hydrochlorothiazide (HYDRODIURIL) tablet 25 mg  25 mg Oral Daily Hessie Knows, MD      . HYDROmorphone (DILAUDID) 1 MG/ML injection           . [START ON 10/06/2016] Influenza vac split quadrivalent PF (FLUARIX) injection 0.5 mL  0.5 mL Intramuscular Tomorrow-1000 Hessie Knows, MD      . magnesium citrate solution 1 Bottle  1 Bottle Oral Once PRN Hessie Knows, MD      . magnesium hydroxide (MILK OF MAGNESIA) suspension 30 mL  30 mL Oral Daily PRN Hessie Knows, MD      . menthol-cetylpyridinium (CEPACOL) lozenge 3 mg  1 lozenge Oral PRN Hessie Knows, MD       Or  . phenol (CHLORASEPTIC) mouth spray 1 spray  1 spray Mouth/Throat PRN Hessie Knows, MD      . methocarbamol (ROBAXIN) tablet 500 mg  500 mg Oral Q6H PRN Hessie Knows, MD       Or  . methocarbamol (ROBAXIN) 500 mg in dextrose 5 % 50 mL IVPB  500 mg Intravenous Q6H PRN Hessie Knows, MD      . metoCLOPramide (REGLAN) tablet 5-10 mg  5-10 mg Oral Q8H PRN Hessie Knows, MD       Or  . metoCLOPramide (REGLAN) injection 5-10 mg  5-10 mg Intravenous Q8H PRN Hessie Knows, MD      . morphine 2 MG/ML injection 2 mg  2 mg Intravenous Q1H PRN Hessie Knows, MD      . ondansetron [facility]) tablet 4 mg  4 mg Oral Q6H PRN Hessie Knows, MD       Or  . ondansetron [facility]) injection 4 mg  4 mg Intravenous Q6H PRN Hessie Knows, MD      . oxyCODONE (Oxy IR/ROXICODONE) immediate release tablet 5-10 mg  5-10 mg Oral Q3H PRN Hessie Knows, MD   5 mg at 10/05/16 1402  . pravastatin (PRAVACHOL) tablet 20 mg  20 mg Oral q1800 Hessie Knows, MD      . zolpidem [facility]) tablet 5 mg  5 mg Oral QHS PRN,MR X 1 Hessie Knows, MD         Discharge Medications: Please see discharge summary for a list of discharge medications.  Relevant Imaging Results:  Relevant  Lab Results:   Additional Information  (SSN: [id number])  Keeleigh Terris, Veronia Beets, LCSW

## 2016-10-05 NOTE — H&P (Signed)
Reviewed paper H+P, will be scanned into chart. No changes noted.  

## 2016-10-05 NOTE — Anesthesia Procedure Notes (Signed)
Procedure Name: Intubation Date/Time: 10/05/2016 7:30 AM Performed by: Nelda Marseille Pre-anesthesia Checklist: Patient identified, Patient being monitored, Timeout performed, Emergency Drugs available and Suction available Patient Re-evaluated:Patient Re-evaluated prior to inductionOxygen Delivery Method: Circle system utilized Preoxygenation: Pre-oxygenation with 100% oxygen Intubation Type: IV induction Ventilation: Mask ventilation without difficulty Laryngoscope Size: Mac and 3 Grade View: Grade I Tube type: Oral Tube size: 7.5 mm Number of attempts: 1 Airway Equipment and Method: Stylet Placement Confirmation: ETT inserted through vocal cords under direct vision,  positive ETCO2 and breath sounds checked- equal and bilateral Secured at: 21 cm Tube secured with: Tape Dental Injury: Teeth and Oropharynx as per pre-operative assessment

## 2016-10-05 NOTE — Op Note (Signed)
10/05/2016  10:25 AM  PATIENT:  Garrett Woods  60 y.o. male  PRE-OPERATIVE DIAGNOSIS:  PRIMARY OSTEOARTHRITIS RIGHT KNEE  POST-OPERATIVE DIAGNOSIS:  PRIMARY OSTEOARTHRITIS RIGHT KNEE  PROCEDURE:  Procedure(s) with comments: TOTAL KNEE ARTHROPLASTY (Right) - +MRSA PCR   SURGEON: Laurene Footman, MD  ASSISTANTS: None  ANESTHESIA:   general  EBL:  Total I/O In: 1300 [I.V.:1300] Out: 500 [Urine:250; Blood:250]  BLOOD ADMINISTERED:none  DRAINS: none   LOCAL MEDICATIONS USED:  MARCAINE    and OTHER Exparel, morphine  SPECIMEN:  No Specimen  DISPOSITION OF SPECIMEN:  N/A  COUNTS:  YES  TOURNIQUET:   63 minutes at 300 mmHg  IMPLANTS: Medacta GMK sphere right 5, 5 right tibia with 10 mm insert and stem with 3 patella, all components cemented  DICTATION: .Dragon Dictation patient brought to the operating room and after the patient was placed under general anesthesia the right leg was prepped and draped in sterile fashion. After patient education timeout procedure a midline skin his incision was made followed by medial parapatellar arthrotomy with exposed bone of the medial compartment and patellofemoral joint moderate lateral compartment degenerative change. The fat pad and anterior cruciate ligament and PCL were intact and excised approximately tibia prepared for Medacta cutting guide proximal tibia cutting guide was applied and the proximal tibia cut carried out using the my knee system. The distal femur was approached a similar fashion and after that that cut 4-in-1 cutting guide was applied anterior posterior and chamfer cuts carried out with the bone very dense. Proximal tibia preparation was carried out after revision excision of the residual menisci the tibia with the tibia and pinned in place and proximal reaming carried out followed by placement of the femoral trial with a 10 mm insert there is excellent stability. The femur was then removed after drilling the 2 distal peg  holes and making the router cut for the trochlear groove recession all trials were then removed and the patella cut using the patellar cutting guide tourniquet was raised after the tibial and femoral cuts as the bone was using. After cutting the patella the 3 drill holes were made and these patella sized to size 3 at this point the bony surfaces were thoroughly irrigated and dried and a lateral release carried out because of the significant tightness to the lateral retinaculum and subluxation of patella. The components were all cemented into place and the knee held in extension with 10 mm insert in place with screw set using the torque screwdriver after the cement had set excess cement was removed and the knee was again thoroughly irrigated with a dilute Betadine solution. The arthrotomy was repaired using a Ethibond along the medial retinaculum followed by a heavy Quill. 3-0 v-loc subcutaneously and skin staples applied. Wound VAC placed and then Polar Care over the knee  PLAN OF CARE: Admit to inpatient   PATIENT DISPOSITION:  PACU - hemodynamically stable.

## 2016-10-05 NOTE — Transfer of Care (Signed)
Immediate Anesthesia Transfer of Care Note  Patient: Garrett Woods  Procedure(s) Performed: Procedure(s) with comments: TOTAL KNEE ARTHROPLASTY (Right) - +MRSA PCR   Patient Location: PACU  Anesthesia Type:General  Level of Consciousness: sedated  Airway & Oxygen Therapy: Patient Spontanous Breathing and Patient connected to face mask oxygen  Post-op Assessment: Report given to RN and Post -op Vital signs reviewed and stable  Post vital signs: Reviewed and stable  Last Vitals:  Vitals:   10/05/16 0616  BP: (!) 162/89  Pulse: 81  Resp: 18  Temp: 37.6 C    Last Pain:  Vitals:   10/05/16 0616  TempSrc: Oral  PainSc: 0-No pain         Complications: No apparent anesthesia complications

## 2016-10-05 NOTE — Anesthesia Preprocedure Evaluation (Signed)
Anesthesia Evaluation  Patient identified by MRN, date of birth, ID band Patient awake    Reviewed: Allergy & Precautions, H&P , NPO status , Patient's Chart, lab work & pertinent test results, reviewed documented beta blocker date and time   Airway Mallampati: IV  TM Distance: >3 FB Neck ROM: full    Dental no notable dental hx.    Pulmonary shortness of breath, former smoker,    Pulmonary exam normal breath sounds clear to auscultation       Cardiovascular hypertension, Pt. on medications + dysrhythmias + pacemaker  Rhythm:regular Rate:Normal     Neuro/Psych Myasthenia gravis  Neuromuscular disease negative psych ROS   GI/Hepatic negative GI ROS, Neg liver ROS,   Endo/Other  negative endocrine ROSMorbid obesity  Renal/GU negative Renal ROS     Musculoskeletal  (+) Arthritis , Osteoarthritis,    Abdominal   Peds  Hematology negative hematology ROS (+)   Anesthesia Other Findings Past Medical History: No date: Arthritis No date: Elevated PSA No date: Gout No date: Heart murmur No date: History of chicken pox No date: Hyperlipemia No date: Hypertension     Comment: Essential No date: Microscopic hematuria No date: Myasthenia gravis ([place]) No date: Pacemaker No date: Prostate cancer ([place])     Comment: Radiation  Reproductive/Obstetrics                             Anesthesia Physical  Anesthesia Plan  ASA: III  Anesthesia Plan: General   Post-op Pain Management:    Induction: Intravenous  Airway Management Planned: Oral ETT  Additional Equipment:   Intra-op Plan:   Post-operative Plan: Extubation in OR  Informed Consent: I have reviewed the patients History and Physical, chart, labs and discussed the procedure including the risks, benefits and alternatives for the proposed anesthesia with the patient or authorized representative who has indicated his/her understanding  and acceptance.   Dental advisory given  Plan Discussed with: CRNA and Surgeon  Anesthesia Plan Comments: (  Discussed general anesthesia, including possible nausea, instrumentation of airway, sore throat,pulmonary aspiration, etc. I asked if the were any outstanding questions, or  concerns before we proceeded. )        Anesthesia Quick Evaluation

## 2016-10-05 NOTE — Progress Notes (Signed)
[facility] requested for prayer. Garber arrived and garbed up. Short conversation with Pt and wife. CH is available.   10/05/16 1235  Clinical Encounter Type  Visited With Patient and family together  Visit Type Initial  Referral From Nurse  Spiritual Encounters  Spiritual Needs Emotional  Stress Factors  Patient Stress Factors None identified  Family Stress Factors None identified

## 2016-10-05 NOTE — Evaluation (Signed)
Physical Therapy Evaluation Patient Details Name: Garrett Woods MRN: [id number] DOB: [birth year] Today's Date: 10/05/2016   History of Present Illness  admitted for acute hospitalization status post R TKR, WBAT (10/05/16).  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  Clinical Impression  Upon evaluation, patient alert and oriented; follows all commands and demonstrates good insight, good participation/effort with all therex/theract.  R LE with good post-op strength and ROM (except baseline R foot drop and chronic sensory changes, unchanged with this admission); able to complete R LE SLR x10 with good control, minimal lag (no KI required).   Completes bed mobility with close sup; sit/stand, basic transfers and gait (5') with bariatric RW, cga/min assist.  Step to gait pattern with fair/good stance time and weight acceptance to R LE. No buckling, LOB or significant safety concern noted. Would benefit from skilled PT to address above deficits and promote optimal return to PLOF; Recommend transition to [facility] upon discharge from acute hospitalization.  Anticipate good progression towards all mobility goals.     Follow Up Recommendations Home health PT    Equipment Recommendations   (bariatric RW)    Recommendations for Other Services       Precautions / Restrictions Precautions Precautions: Fall Restrictions Weight Bearing Restrictions: Yes RLE Weight Bearing: Weight bearing as tolerated      Mobility  Bed Mobility Overal bed mobility: Needs Assistance Bed Mobility: Supine to Sit     Supine to sit: Supervision        Transfers Overall transfer level: Needs assistance Equipment used: Rolling walker (2 wheeled) Transfers: Sit to/from Stand Sit to Stand: Min guard;Min assist         General transfer comment: fair active use of R LE with good tolerance  for R knee flexion during movement transition; cuing for hand placement   Ambulation/Gait Ambulation/Gait assistance: Min guard Ambulation Distance (Feet): 5 Feet Assistive device: Rolling walker (2 wheeled)       General Gait Details: step to gait pattern with fair/good weight acceptance/stance time R LE; good knee control without buckling or LOB.  Minimal increase in pain with WBing activities  Stairs            Wheelchair Mobility    Modified Rankin (Stroke Patients Only)       Balance Overall balance assessment: Needs assistance Sitting-balance support: No upper extremity supported;Feet supported Sitting balance-[name]: Good     Standing balance support: Bilateral upper extremity supported Standing balance-[name]: Good                               Pertinent Vitals/Pain Pain Assessment: 0-10 Pain Score: 4  Pain Location: R knee Pain Descriptors / Indicators: Aching;Grimacing;Guarding Pain Intervention(s): Limited activity within patient's tolerance;Monitored during session;Premedicated before session;Repositioned    Home Living Family/patient expects to be discharged to:: Private residence Living Arrangements: Spouse/significant other Available Help at Discharge: Family;Available 24 hours/day Type of Home: House Home Access: Stairs to enter Entrance Stairs-Rails: Left Entrance Stairs-Number of Steps: 1 Home Layout: Two level;Able to live on main level with bedroom/bathroom Home Equipment: Gilford Rile - 2 wheels;Bedside commode;Hand held shower head;Adaptive equipment      Prior Function Level of Independence: Independent with assistive device(s)         Comments: Mod indep with SPC for ADLs, household and community mobility; working  full-time as COO for company in [place] (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  Does endorse 2 falls since back surgery in August, 2017     Hand Dominance        Extremity/Trunk  Assessment   Upper Extremity Assessment: Overall WFL for tasks assessed           Lower Extremity Assessment: Overall WFL for tasks assessed (R LE grossly 3-/5 throughout, except R ankle DF 1/5; ROM grossly 4-77 degrees, act assist; baseline sensory deficit in R foot, unchanged post-op)         Communication   Communication: No difficulties  Cognition Arousal/Alertness: Awake/alert Behavior During Therapy: WFL for tasks assessed/performed Overall Cognitive Status: Within Functional Limits for tasks assessed                      General Comments      Exercises Total Joint Exercises Goniometric ROM: 4-77 degrees, act assist Other Exercises Other Exercises: Supine LE therex, 1x10, AROM for muscular strength/endurance: ankle pumps, quad sets, SAQs, heel slides, hip abduct/adduct and SLR.  Good R quad activation; good control (minimal lag) with R LE SLR.   Assessment/Plan    PT Assessment Patient needs continued PT services  PT Problem List Decreased strength;Decreased activity tolerance;Decreased balance;Decreased range of motion;Decreased mobility;Decreased knowledge of use of DME;Decreased safety awareness;Decreased knowledge of precautions;Decreased skin integrity;Pain;Obesity          PT Treatment Interventions DME instruction;Gait training;Stair training;Functional mobility training;Therapeutic activities;Therapeutic exercise;Balance training;Patient/family education    PT Goals (Current goals can be found in the Care Plan section)  Acute Rehab PT Goals Patient Stated Goal: to return home PT Goal Formulation: With patient/family Time For Goal Achievement: 10/19/16 Potential to Achieve Goals: Good    Frequency BID   Barriers to discharge        Co-evaluation               End of Session Equipment Utilized During Treatment: Gait belt Activity Tolerance: Patient tolerated treatment well Patient left: in chair;with call bell/phone within reach;with  chair alarm set;with family/visitor present Nurse Communication: Mobility status         Time: [id number] PT Time Calculation (min) (ACUTE ONLY): 38 min   Charges:   PT Evaluation $PT Eval Low Complexity: 1 Procedure PT Treatments $Therapeutic Exercise: 8-22 mins   PT G Codes:        Barbra Miner H. Owens Shark, PT, DPT, NCS 10/05/16, 4:04 PM [phone]

## 2016-10-06 LAB — BASIC METABOLIC PANEL
ANION GAP: 6 (ref 5–15)
BUN: 16 mg/dL (ref 6–20)
CALCIUM: 8.8 mg/dL — AB (ref 8.9–10.3)
CO2: 27 mmol/L (ref 22–32)
CREATININE: 1.01 mg/dL (ref 0.61–1.24)
Chloride: 104 mmol/L (ref 101–111)
GFR calc Af Amer: >60 mL/min (ref >60)
GLUCOSE: 139 mg/dL — AB (ref 65–99)
Potassium: 4.2 mmol/L (ref 3.5–5.1)
Sodium: 137 mmol/L (ref 135–145)

## 2016-10-06 LAB — CBC
HCT: 31.7 % — ABNORMAL LOW (ref 40.0–52.0)
Hemoglobin: 10.8 g/dL — ABNORMAL LOW (ref 13.0–18.0)
MCH: 30.2 pg (ref 26.0–34.0)
MCHC: 34.3 g/dL (ref 32.0–36.0)
MCV: 88.2 fL (ref 80.0–100.0)
PLATELETS: 231 10*3/uL (ref 150–440)
RBC: 3.59 MIL/uL — ABNORMAL LOW (ref 4.40–5.90)
RDW: 17 % — AB (ref 11.5–14.5)
WBC: 12.8 10*3/uL — AB (ref 3.8–10.6)

## 2016-10-06 MED ORDER — MUPIROCIN 2 % EX OINT
1.0000 "application " | TOPICAL_OINTMENT | Freq: Two times a day (BID) | CUTANEOUS | Status: DC
  Administered 2016-10-06 – 2016-10-08 (×4): 1 via NASAL
  Filled 2016-10-06: qty 22

## 2016-10-06 MED ORDER — CHLORHEXIDINE GLUCONATE CLOTH 2 % EX PADS
6.0000 | MEDICATED_PAD | Freq: Every day | CUTANEOUS | Status: DC
  Administered 2016-10-07 – 2016-10-08 (×2): 6 via TOPICAL

## 2016-10-06 NOTE — Progress Notes (Signed)
Clinical Social Worker (CSW) received SNF consult. PT is recommending home health. RN case manager is aware of above. Please reconsult if future social work needs arise. CSW signing off.   Katherina Wimer, LCSW [phone] 

## 2016-10-06 NOTE — Progress Notes (Signed)
   Subjective: 1 Day Post-Op Procedure(s) (LRB): TOTAL KNEE ARTHROPLASTY (Right) Patient reports pain as mild.   Patient is well, and has had no acute complaints or problems Denies any CP, SOB, ABD pain. We will continue therapy today.  Plan is to go Home after hospital stay.  Objective: Vital signs in last 24 hours: Temp:  [97.4 F (36.3 C)-98.7 F (37.1 C)] 97.4 F (36.3 C) (12/06 0528) Pulse Rate:  [63-84] 70 (12/06 0528) Resp:  [5-18] 18 (12/06 0528) BP: (118-165)/(67-106) 119/71 (12/06 0528) SpO2:  [94 %-100 %] 98 % (12/06 0528) FiO2 (%):  [21 %] 21 % (12/05 1208)  Intake/Output from previous day: 12/05 0701 - 12/06 0700 In: 2661.3 [P.O.:25; I.V.:2501.3; IV Piggyback:50] Out: 2150 [Urine:1900; Blood:250] Intake/Output this shift: No intake/output data recorded.   Recent Labs  10/05/16 1230 10/06/16 0328  HGB 12.2* 10.8*    Recent Labs  10/05/16 1230 10/06/16 0328  WBC 10.2 12.8*  RBC 4.11* 3.59*  HCT 36.1* 31.7*  PLT 221 231    Recent Labs  10/05/16 1230 10/06/16 0328  NA  --  137  K  --  4.2  CL  --  104  CO2  --  27  BUN  --  16  CREATININE 1.18 1.01  GLUCOSE  --  139*  CALCIUM  --  8.8*   No results for input(s): LABPT, INR in the last 72 hours.  EXAM General - Patient is Alert, Appropriate and Oriented Extremity - Neurovascular intact Sensation intact distally Intact pulses distally Dorsiflexion/Plantar flexion intact No cellulitis present Compartment soft Dressing - moderate drainage and wound vac intact. Motor Function - intact, moving foot and toes well on exam.   Past Medical History:  Diagnosis Date  . Arthritis   . Elevated PSA   . Gout   . Heart murmur   . History of chicken pox   . Hyperlipemia   . Hypertension    Essential  . Microscopic hematuria   . Myasthenia gravis ([place])   . Pacemaker   . Prostate cancer ([facility])    Radiation    Assessment/Plan:   1 Day Post-Op Procedure(s) (LRB): TOTAL KNEE ARTHROPLASTY  (Right) Active Problems:   Primary osteoarthritis of right knee  Estimated body mass index is 44.76 kg/m as calculated from the following:   Height as of this encounter: 6' (1.829 m).   Weight as of this encounter: 149.7 kg (330 lb). Advance diet Up with therapy  Acute post op blood loss anemia - recheck labs in the am Needs BM CM to assist with discharge to home with HHPT   DVT Prophylaxis - Lovenox, Foot Pumps and TED hose Weight-Bearing as tolerated to right leg   T. Rachelle Hora, PA-C [facility] 10/06/2016, 8:00 AM

## 2016-10-06 NOTE — Care Management (Signed)
TC to [facility] ([phone]). Requested home health PT and walker. Faxed clinical to ([phone]). Patient updated on start of authorization.

## 2016-10-06 NOTE — Progress Notes (Signed)
Physical Therapy Treatment Patient Details Name: Garrett Woods MRN: [id number] DOB: 1956-09-15 Today's Date: 10/06/2016    History of Present Illness admitted for acute hospitalization status post R TKR, WBAT (10/05/16).  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    PT Comments    Participated in exercises as described below.  Pt with increased ambulation today.  He was able to ambulate in the hallway 150' with min guard and no LOB/buckling noted. Progressing well with good motivation to increase independence.   Follow Up Recommendations  Home health PT     Equipment Recommendations  Rolling walker with 5" wheels    Recommendations for Other Services       Precautions / Restrictions Precautions Precautions: Fall Restrictions Weight Bearing Restrictions: Yes RLE Weight Bearing: Weight bearing as tolerated    Mobility  Bed Mobility Overal bed mobility: Needs Assistance Bed Mobility: Supine to Sit     Supine to sit: Supervision        Transfers Overall transfer level: Needs assistance Equipment used: Rolling walker (2 wheeled)   Sit to Stand: Min guard;Min assist            Ambulation/Gait Ambulation/Gait assistance: Min guard Ambulation Distance (Feet): 150 Feet Assistive device: Rolling walker (2 wheeled) Gait Pattern/deviations: Step-to pattern Gait velocity: no buckling noted, generally steady       Stairs            Wheelchair Mobility    Modified Rankin (Stroke Patients Only)       Balance Overall balance assessment: Needs assistance Sitting-balance support: No upper extremity supported Sitting balance-[name]: Good     Standing balance support: Bilateral upper extremity supported Standing balance-[name]: Good                      Cognition                             Exercises Total Joint Exercises Ankle Circles/Pumps: AROM;Both;10 reps Quad Sets: AROM;Both;10 reps Short Arc Quad: AROM;Right;10 reps Heel Slides: AROM;Right;10 reps Hip ABduction/ADduction: Right;10 reps Straight Leg Raises: AAROM;Right;10 reps Long Arc Quad: AROM;Right;10 reps Knee Flexion: AROM;Right;10 reps Goniometric ROM: 0-89 active assist    General Comments        Pertinent Vitals/Pain Pain Score: 4  Pain Location: R knee Pain Descriptors / Indicators: Sore;Operative site guarding Pain Intervention(s): Premedicated before session;Ice applied    Home Living                      Prior Function            PT Goals (current goals can now be found in the care plan section) Progress towards PT goals: Progressing toward goals    Frequency    BID      PT Plan Current plan remains appropriate    Co-evaluation             End of Session Equipment Utilized During Treatment: Gait belt Activity Tolerance: Patient tolerated treatment well Patient left: in chair;with call bell/phone within reach;with chair alarm set;with family/visitor present     Time: 0922-1000 PT Time Calculation (min) (ACUTE ONLY): 38 min  Charges:  $Gait Training: 8-22 mins $Therapeutic Exercise: 8-22 mins  G Codes:      [name] 10/06/2016, 10:16 AM

## 2016-10-06 NOTE — Care Management (Signed)
Pharmacy [place] [phone]. Called Lovenox 40 mg # 14 no refills.

## 2016-10-06 NOTE — Evaluation (Signed)
Occupational Therapy Evaluation Patient Details Name: Garrett Woods MRN: [id number] DOB: 26-May-1956 Today's Date: 10/06/2016    History of Present Illness admitted for acute hospitalization status post R TKR, WBAT (10/05/16).  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   Clinical Impression   Pt is 60 year old male s/p R TKR with WBAT.  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 He is eager to return to PLOF.  Pt currently requires min assist for LB dressing while in seated position due to limited AROM of R knee. He has been using a reacher since he had back surgery in August 2017 and has grab bar by back door into home, in shower stall and by toilet. He has a shower seat in shower stall as well. Pt instructed to wear hospital socks to prevent falls when ambulating duirng ADLs in order to prevent falls on hardwood floors.  He was also instructed in use of sock aid and rec use of a sock version due to larger ankles and calf.  No further OT needs identified and pt seen for OT evaluation only.     .      Follow Up Recommendations  No OT follow up    Equipment Recommendations       Recommendations for Other Services       Precautions / Restrictions Precautions Precautions: Fall Precaution Comments: MRSA contact precautions Restrictions Weight Bearing Restrictions: Yes RLE Weight Bearing: Weight bearing as tolerated      Mobility Bed Mobility Overal bed mobility: Needs Assistance Bed Mobility: Supine to Sit     Supine to sit: Supervision        Transfers Overall transfer level: Needs assistance Equipment used: Rolling walker (2 wheeled)   Sit to Stand: Min guard;Min assist              Balance Overall balance assessment: Needs assistance Sitting-balance support: No upper extremity  supported Sitting balance-[name]: Good     Standing balance support: Bilateral upper extremity supported Standing balance-[name]: Good                              ADL Overall ADL's : Needs assistance/impaired Eating/Feeding: Independent   Grooming: Independent;Wash/dry face;Oral care;Applying deodorant;Wash/dry hands;Brushing hair           Upper Body Dressing : Independent   Lower Body Dressing: Minimal assistance;Set up;With adaptive equipment Lower Body Dressing Details (indicate cue type and reason): Pt educated in use of sock aid and has been using a reacher at home for LB dressing since back surgery in August 2017.                     Vision     Perception     Praxis      Pertinent Vitals/Pain Pain Assessment: No/denies pain Pain Score: 4  Pain Location: R knee Pain Descriptors / Indicators: Sore;Operative site guarding Pain Intervention(s): Premedicated before session;Ice applied     Hand Dominance Right   Extremity/Trunk Assessment Upper Extremity Assessment Upper Extremity Assessment: Overall WFL for tasks assessed   Lower Extremity Assessment Lower Extremity Assessment: Defer to PT evaluation       Communication Communication Communication: No difficulties   Cognition Arousal/Alertness: Awake/alert Behavior During Therapy: [facility]  for tasks assessed/performed Overall Cognitive Status: Within Functional Limits for tasks assessed                     General Comments       Exercises       Shoulder Instructions      Home Living Family/patient expects to be discharged to:: Private residence Living Arrangements: Spouse/significant other Available Help at Discharge: Family;Available 24 hours/day Type of Home: House Home Access: Stairs to enter [employer] of Steps: 1 Entrance Stairs-Rails: Left Home Layout: Two level;Able to live on main level with bedroom/bathroom Alternate Level Stairs-Number  of Steps: 1 Alternate Level Stairs-Rails: None Bathroom Shower/Tub: Walk-in shower;Door         Home Equipment: Walker - 2 wheels;Bedside commode;Hand held shower head;Adaptive equipment;Shower seat;Grab bars - tub/shower;Grab bars - toilet Adaptive Equipment: Reacher        Prior Functioning/Environment Level of Independence: Independent with assistive device(s)        Comments: Mod indep with SPC for ADLs, household and community mobility; 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  Does endorse 2 falls since back surgery in August, 2017        OT Problem List:     OT Treatment/Interventions:      OT Goals(Current goals can be found in the care plan section) Acute Rehab OT Goals Patient Stated Goal: to return home and get back to work as COO OT Goal Formulation: With patient Time For Goal Achievement: 10/20/16 Potential to Achieve Goals: Good  OT Frequency:     Barriers to D/C:            Co-evaluation              End of Session    Activity Tolerance: Patient tolerated treatment well Patient left: in chair;with call bell/phone within reach;with chair alarm set   Time: 1035-1102 OT Time Calculation (min): 27 min Charges:  OT General Charges $OT Visit: 1 Procedure OT Evaluation $OT Eval Low Complexity: 1 Procedure OT Treatments $Self Care/Home Management : 8-22 mins G-Codes:     Chrys Racer, OTR/L ascom [phone] 10/06/16, 11:08 AM

## 2016-10-06 NOTE — Progress Notes (Signed)
Physical Therapy Treatment Patient Details Name: Garrett Woods MRN: [id number] DOB: [birth year] Today's Date: 10/06/2016    History of Present Illness admitted for acute hospitalization status post R TKR, WBAT (10/05/16).  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    PT Comments    Pt ready to ambulate this pm.  Pt stood and was able to increase his distances this pm with good gait quality and no buckling noted.  To bathroom upon return to room where he voided but no BM.  He did stub his right toe across bathroom threshold due to decreased step height and education provided to be aware of uneven surfaces.  He was able to recover without assist.  Min a for le management to get back to bed.   Follow Up Recommendations  Home health PT     Equipment Recommendations  Rolling walker with 5" wheels    Recommendations for Other Services       Precautions / Restrictions Precautions Precautions: Fall Precaution Comments: MRSA contact precautions Restrictions Weight Bearing Restrictions: Yes RLE Weight Bearing: Weight bearing as tolerated    Mobility  Bed Mobility Overal bed mobility: Needs Assistance Bed Mobility: Sit to Supine       Sit to supine: Min assist   General bed mobility comments: LE management  Transfers Overall transfer level: Needs assistance Equipment used: Rolling walker (2 wheeled) Transfers: Sit to/from Stand Sit to Stand: Min guard;Min assist            Ambulation/Gait Ambulation/Gait assistance: Min guard Ambulation Distance (Feet): 170 Feet Assistive device: Rolling walker (2 wheeled) Gait Pattern/deviations: Step-to pattern Gait velocity: no buckling noted, generally steady Gait velocity interpretation: Below normal speed for age/gender General Gait Details: motivated to increase distances    Marine scientist Rankin (Stroke Patients Only)       Balance     Sitting balance-[name]: Good       Standing balance-[name]: Good                      Cognition Arousal/Alertness: Awake/alert Behavior During Therapy: WFL for tasks assessed/performed Overall Cognitive Status: Within Functional Limits for tasks assessed                      Exercises      General Comments        Pertinent Vitals/Pain Pain Assessment: No/denies pain Pain Score: 4  Pain Location: R knee Pain Descriptors / Indicators: Sore;Operative site guarding Pain Intervention(s): Ice applied;Limited activity within patient's tolerance    Home Living Family/patient expects to be discharged to:: Private residence Living Arrangements: Spouse/significant other Available Help at Discharge: Family;Available 24 hours/day Type of Home: House Home Access: Stairs to enter Entrance Stairs-Rails: Left Home Layout: Two level;Able to live on main level with bedroom/bathroom Home Equipment: Gilford Rile - 2 wheels;Bedside commode;Hand held shower head;Adaptive equipment;Shower seat;Grab bars - tub/shower;Grab bars - toilet      Prior Function Level of Independence: Independent with assistive device(s)      Comments: Mod indep with SPC for ADLs, household and community mobility; working full-time as COO for company in [place] (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  Does endorse 2 falls since back surgery in August, 2017   PT Goals (current goals  can now be found in the care plan section) Acute Rehab PT Goals Patient Stated Goal: to return home and get back to work as COO Progress towards PT goals: Progressing toward goals    Frequency    BID      PT Plan Current plan remains appropriate    Co-evaluation             End of Session Equipment Utilized During Treatment: Gait belt Activity Tolerance: Patient tolerated treatment well Patient  left: in chair;with call bell/phone within reach;with chair alarm set;with family/visitor present     Time: [id number] PT Time Calculation (min) (ACUTE ONLY): 24 min  Charges:  $Gait Training: 8-22 mins $Therapeutic Activity: 8-22 mins                    G Codes:      [name] 10/12/16, 2:47 PM

## 2016-10-07 LAB — BASIC METABOLIC PANEL
ANION GAP: 7 (ref 5–15)
BUN: 19 mg/dL (ref 6–20)
CHLORIDE: 100 mmol/L — AB (ref 101–111)
CO2: 29 mmol/L (ref 22–32)
Calcium: 8.9 mg/dL (ref 8.9–10.3)
Creatinine, Ser: 0.98 mg/dL (ref 0.61–1.24)
GFR calc Af Amer: >60 mL/min (ref >60)
GLUCOSE: 107 mg/dL — AB (ref 65–99)
POTASSIUM: 3.7 mmol/L (ref 3.5–5.1)
Sodium: 136 mmol/L (ref 135–145)

## 2016-10-07 LAB — CBC
HCT: 31.7 % — ABNORMAL LOW (ref 40.0–52.0)
HEMOGLOBIN: 10.9 g/dL — AB (ref 13.0–18.0)
MCH: 30.3 pg (ref 26.0–34.0)
MCHC: 34.4 g/dL (ref 32.0–36.0)
MCV: 88 fL (ref 80.0–100.0)
Platelets: 193 10*3/uL (ref 150–440)
RBC: 3.6 MIL/uL — AB (ref 4.40–5.90)
RDW: 17.1 % — ABNORMAL HIGH (ref 11.5–14.5)
WBC: 10.3 10*3/uL (ref 3.8–10.6)

## 2016-10-07 MED ORDER — ENOXAPARIN SODIUM 40 MG/0.4ML ~~LOC~~ SOLN: 40.0000 mg | SUBCUTANEOUS | 0 refills | Status: DC

## 2016-10-07 MED ORDER — OXYCODONE HCL 5 MG PO TABS: 5.0000 mg | ORAL_TABLET | ORAL | 0 refills | Status: DC | PRN

## 2016-10-07 NOTE — Progress Notes (Signed)
   Subjective: 2 Days Post-Op Procedure(s) (LRB): TOTAL KNEE ARTHROPLASTY (Right) Patient reports pain as 6 on 0-10 scale.   Patient is well, and has had no acute complaints or problems Denies any CP, SOB, ABD pain. We will continue therapy today.  Plan is to go Home after hospital stay.  Objective: Vital signs in last 24 hours: Temp:  [97.8 F (36.6 C)-98.9 F (37.2 C)] 98.9 F (37.2 C) (12/07 0826) Pulse Rate:  [67-77] 77 (12/07 0826) Resp:  [12-20] 19 (12/07 0826) BP: (112-138)/(57-67) 116/67 (12/07 0826) SpO2:  [98 %-100 %] 98 % (12/07 0826)  Intake/Output from previous day: 12/06 0701 - 12/07 0700 In: 480 [P.O.:480] Out: 800 [Urine:800] Intake/Output this shift: Total I/O In: -  Out: 200 [Urine:200]   Recent Labs  10/05/16 1230 10/06/16 0328 10/07/16 0427  HGB 12.2* 10.8* 10.9*    Recent Labs  10/06/16 0328 10/07/16 0427  WBC 12.8* 10.3  RBC 3.59* 3.60*  HCT 31.7* 31.7*  PLT 231 193    Recent Labs  10/06/16 0328 10/07/16 0427  NA 137 136  K 4.2 3.7  CL 104 100*  CO2 27 29  BUN 16 19  CREATININE 1.01 0.98  GLUCOSE 139* 107*  CALCIUM 8.8* 8.9   No results for input(s): LABPT, INR in the last 72 hours.  EXAM General - Patient is Alert, Appropriate and Oriented Extremity - Neurovascular intact Sensation intact distally Intact pulses distally Dorsiflexion/Plantar flexion intact No cellulitis present Compartment soft Dressing - moderate drainage and wound vac intact. 250 cc output Motor Function - intact, moving foot and toes well on exam.   Past Medical History:  Diagnosis Date  . Arthritis   . Elevated PSA   . Gout   . Heart murmur   . History of chicken pox   . Hyperlipemia   . Hypertension    Essential  . Microscopic hematuria   . Myasthenia gravis ([place])   . Pacemaker   . Prostate cancer ([facility])    Radiation    Assessment/Plan:   2 Days Post-Op Procedure(s) (LRB): TOTAL KNEE ARTHROPLASTY (Right) Active Problems:  Primary osteoarthritis of right knee  Estimated body mass index is 44.76 kg/m as calculated from the following:   Height as of this encounter: 6' (1.829 m).   Weight as of this encounter: 149.7 kg (330 lb). Advance diet Up with therapy  Acute post op blood loss anemia - Hgb stable and trending up Needs BM CM to assist with discharge to home with HHPT tomorrow. Will change wound vac in the am to disposable provena plus  DVT Prophylaxis - Lovenox, Foot Pumps and TED hose Weight-Bearing as tolerated to right leg   T. Rachelle Hora, PA-C [place] 10/07/2016, 8:41 AM

## 2016-10-07 NOTE — Progress Notes (Signed)
Physical Therapy Treatment Patient Details Name: Garrett Woods MRN: [id number] DOB: 02-08-1956 Today's Date: 10/07/2016    History of Present Illness admitted for acute hospitalization status post R TKR, WBAT (10/05/16).  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    PT Comments    Pt agreeable to PT; does note increased lethargy and continued pain with movement/function. Pt agreeable to supine and seated exercises. Continues to requires assist for bed mobility for Right lower extremity. Assist for Right lower extremity exercises for hip and knee flexion. Improved range from morning session. Educated on carry over for exercises at home as well as positioning. Continue PT to progress strength, range and all functional mobility for an optimal and safe return home.   Follow Up Recommendations  Home health PT     Equipment Recommendations  Rolling walker with 5" wheels    Recommendations for Other Services       Precautions / Restrictions Restrictions Weight Bearing Restrictions: Yes RLE Weight Bearing: Weight bearing as tolerated    Mobility  Bed Mobility Overal bed mobility: Needs Assistance Bed Mobility: Sit to Supine;Supine to Sit     Supine to sit: Min assist;HOB elevated Sit to supine: Min assist;HOB elevated   General bed mobility comments: Asst for RLE  Transfers Overall transfer level: Needs assistance Equipment used: Rolling walker (2 wheeled) Transfers: Sit to/from Stand Sit to Stand: Min guard         General transfer comment: Mild unsteady due to decreased use of RLE; encouraged use as well as static stand with weigth shifting before ambulation.   Ambulation/Gait Ambulation/Gait assistance: Min guard Ambulation Distance (Feet): 54 Feet Assistive device: Rolling walker (2 wheeled) Gait Pattern/deviations: Step-to pattern;Decreased  weight shift to right;Decreased dorsiflexion - right;Antalgic (Stiff leg pattern) Gait velocity: slow Gait velocity interpretation: <1.8 ft/sec, indicative of risk for recurrent falls General Gait Details: Slow antalgic gait with stiff RLE   Stairs Stairs: Yes   Stair Management: Two rails;Step to pattern;Forwards;Backwards Number of Stairs: 1 General stair comments: Ascend forward and descend backward. Good control/safety.  Wheelchair Mobility    Modified Rankin (Stroke Patients Only)       Balance Overall balance assessment: Needs assistance   Sitting balance-[name]: Good     Standing balance support: Bilateral upper extremity supported Standing balance-[name]: Fair                      Cognition Arousal/Alertness: Lethargic Behavior During Therapy: WFL for tasks assessed/performed Overall Cognitive Status: Within Functional Limits for tasks assessed                      Exercises Total Joint Exercises Ankle Circles/Pumps: AROM;Both;20 reps;Supine (Difficulty on R due to neuropathy) Quad Sets: Strengthening;Both;20 reps;Supine Heel Slides: AAROM;Right;10 reps;Supine Long Arc Quad: Right;10 reps;AAROM;Seated Knee Flexion: AROM;Right;Seated;5 reps (2 positions each rep with 10 second hold) Goniometric ROM: -2 to 80 Marching in Standing: Right;20 reps;Seated;AAROM    General Comments        Pertinent Vitals/Pain Pain Assessment: 0-10 Pain Score: 3  (at rest; increases to 7/8 with activity) Pain Location: R knee Pain Descriptors / Indicators: Constant;Aching;Guarding;Operative site guarding Pain Intervention(s): Limited activity within patient's tolerance    Home Living                      Prior Function  PT Goals (current goals can now be found in the care plan section) Progress towards PT goals: Progressing toward goals    Frequency    BID      PT Plan Current plan remains appropriate     Co-evaluation             End of Session Equipment Utilized During Treatment: Gait belt Activity Tolerance: Patient limited by pain;Patient limited by fatigue;Patient limited by lethargy Patient left: in bed;with call bell/phone within reach;with SCD's reapplied;Other (comment) (polar care; refuses alarm, but will call for assist)     Time: 1324-1400 PT Time Calculation (min) (ACUTE ONLY): 36 min  Charges:  $Gait Training: 8-22 mins $Therapeutic Exercise: 8-22 mins $Therapeutic Activity: 8-22 mins                    G Codes:      Larae Grooms, PTA 10/07/2016, 2:20 PM

## 2016-10-07 NOTE — Discharge Summary (Signed)
Physician Discharge Summary  Patient ID: [name] MRN: [id number] DOB/AGE: 1955/11/20 59 y.o.  Admit date: 10/05/2016 Discharge date: 10/08/2016 Admission Diagnoses:  PRIMARY OSTEOARTHRITIS RIGHT KNEE   Discharge Diagnoses: Patient Active Problem List   Diagnosis Date Noted  . Primary osteoarthritis of right knee 10/05/2016  . Lumbar stenosis with neurogenic claudication 06/02/2016  . Lumbar spinal stenosis 08/29/2015  . Back ache 08/19/2015  . History of basal cell cancer 06/11/2015  . Prostate cancer ([place]) 06/11/2015  . Low back pain potentially associated with radiculopathy 06/11/2015  . Myasthenia gravis ([place]) 06/11/2015  . Benign essential HTN 02/09/2015  . Combined fat and carbohydrate induced hyperlipemia 12/30/2014  . Artificial cardiac pacemaker 12/30/2014  . Bradycardia 11/21/2014  . Breathlessness on exertion 11/21/2014  . 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 Carpal tunnel syndrome 05/08/2009  . Lumbago 01/09/2009  . 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 Hyperlipidemia 11/08/2006  . Obesity, morbid (more than 100 lbs over ideal weight or BMI > 40) ([place]) 07/23/2006  . Myasthenia gravis ([place]) 07/22/2003  . Essential (primary) hypertension 12/02/2000    Past Medical History:  Diagnosis Date  . Arthritis   . Elevated PSA   . Gout   . Heart murmur   . History of chicken pox   . Hyperlipemia   . Hypertension    Essential  . Microscopic hematuria   . Myasthenia gravis ([place])   . Pacemaker   . Prostate cancer [facility])    Radiation     Transfusion: none   Consultants (if any):   Discharged Condition: Improved  Hospital Course: Garrett Woods is an 60 y.o. male who was admitted 10/05/2016 with a diagnosis of right total knee osteoarthritis and went to the operating room on 10/05/2016 and underwent the above named procedures.    Surgeries: Procedure(s): TOTAL KNEE ARTHROPLASTY on 10/05/2016 Patient tolerated the  surgery well. Taken to PACU where she was stabilized and then transferred to the orthopedic floor.  Started on Lovenox 30 q 12 hrs. Foot pumps applied bilaterally at 80 mm. Heels elevated on bed with rolled towels. No evidence of DVT. Negative [place]oman. Physical therapy started on day #1 for gait training and transfer. OT started day #1 for ADL and assisted devices.  Patient's foley was d/c on day #1. Patient's IV was d/c on day #2.  On post op day #3 patient developed right ankle pain, has a hx of gout. Uric acid elevated and patient started on colchicine. Patient was able to tolerate PT. Patient was stable and ready for discharge to home with HHPT. Start 6 day steroid taper for gout.  Implants: Medacta GMK sphere right 5, 5 right tibia with 10 mm insert and stem with 3 patella, all components cemented   He was given perioperative antibiotics:  Anti-infectives    Start     Dose/Rate Route Frequency Ordered Stop   10/05/16 1400  ceFAZolin (ANCEF) 3 g in dextrose 5 % 50 mL IVPB     3 g 130 mL/hr over 30 Minutes Intravenous Every 6 hours 10/05/16 1208 10/06/16 0200   10/05/16 0550  vancomycin (VANCOCIN) 1-5 GM/200ML-% IVPB    Comments:  Ronnell Freshwater: cabinet override      10/05/16 0550 10/05/16 0721   10/05/16 0550  ceFAZolin (ANCEF) 2-4 GM/100ML-% IVPB    Comments:  Ronnell Freshwater: cabinet override      10/05/16 0550 10/05/16 0744   10/05/16 0200  ceFAZolin (ANCEF) IVPB 2g/100 mL premix  2 g 200 mL/hr over 30 Minutes Intravenous  Once 10/05/16 0155 10/05/16 0754   10/05/16 0200  vancomycin (VANCOCIN) IVPB 1000 mg/200 mL premix     1,000 mg 200 mL/hr over 60 Minutes Intravenous  Once 10/05/16 0155 10/05/16 0801    .  He was given sequential compression devices, early ambulation, and Lovenox for DVT prophylaxis.  He benefited maximally from the hospital stay and there were no complications.    Recent vital signs:  Vitals:   10/08/16 0441 10/08/16 0834  BP: 108/61 119/65   Pulse: 87 80  Resp: 16   Temp: 97.6 F (36.4 C) 98 F (36.7 C)    Recent laboratory studies:  Lab Results  Component Value Date   HGB 10.9 (L) 10/07/2016   HGB 10.8 (L) 10/06/2016   HGB 12.2 (L) 10/05/2016   Lab Results  Component Value Date   WBC 10.3 10/07/2016   PLT 193 10/07/2016   Lab Results  Component Value Date   INR 1.05 09/22/2016   Lab Results  Component Value Date   NA 136 10/07/2016   K 3.7 10/07/2016   CL 100 (L) 10/07/2016   CO2 29 10/07/2016   BUN 19 10/07/2016   CREATININE 0.98 10/07/2016   GLUCOSE 107 (H) 10/07/2016    Discharge Medications:     Medication List    TAKE these medications   acetaminophen 500 MG tablet Commonly known as:  TYLENOL Take 1,000 mg by mouth every 6 (six) hours as needed for mild pain.   diazepam 5 MG tablet Commonly known as:  VALIUM Take 1 tablet (5 mg total) by mouth every 6 (six) hours as needed for muscle spasms.   diclofenac 50 MG tablet Commonly known as:  CATAFLAM TAKE 1 TABLET BY MOUTH TWICE A DAY AS NEEDED FOR BACK PAIN   diclofenac 75 MG EC tablet Commonly known as:  VOLTAREN Take 1 tablet (75 mg total) by mouth 2 (two) times daily.   docusate sodium 100 MG capsule Commonly known as:  COLACE Take 1 capsule (100 mg total) by mouth 2 (two) times daily.   enoxaparin 40 MG/0.4ML injection Commonly known as:  LOVENOX Inject 0.4 mLs (40 mg total) into the skin daily.   fluorouracil 5 % cream Commonly known as:  EFUDEX Apply 1 application topically 2 (two) times daily. Apply for 2 weeks  Patient has about a week left to take   ibuprofen 200 MG tablet Commonly known as:  ADVIL,MOTRIN Take 400 mg by mouth every 6 (six) hours as needed for mild pain.   losartan-hydrochlorothiazide 100-25 MG tablet Commonly known as:  HYZAAR TAKE 1 TABLET BY MOUTH DAILY   lovastatin 40 MG tablet Commonly known as:  MEVACOR Take 40 mg by mouth daily.   oxyCODONE 5 MG immediate release tablet Commonly known as:   Oxy IR/ROXICODONE Take 1-2 tablets (5-10 mg total) by mouth every 3 (three) hours as needed for breakthrough pain.   oxyCODONE-acetaminophen 5-325 MG tablet Commonly known as:  PERCOCET/ROXICET Take 1-2 tablets by mouth every 4 (four) hours as needed for moderate pain.   predniSONE 10 MG tablet Commonly known as:  DELTASONE Take 1 tablet (10 mg total) by mouth daily. 6,5,4,3,2,1 six day taper            Durable Medical Equipment        Start     Ordered   10/05/16 1208  DME Walker rolling  Once    Question:  Patient needs a walker  to treat with the following condition  Answer:  Status post right knee replacement   10/05/16 1208   10/05/16 1208  DME 3 n 1  Once     10/05/16 1208   10/05/16 1208  DME Bedside commode  Once    Question:  Patient needs a bedside commode to treat with the following condition  Answer:  Status post total knee replacement using cement, right   10/05/16 1208      Diagnostic Studies: Dg Knee 1-2 Views Right  Result Date: 10/05/2016 CLINICAL DATA:  Status post right total knee replacement today. EXAM: RIGHT KNEE - 1-2 VIEW COMPARISON:  CT right knee 08/18/2016. FINDINGS: New right total knee arthroplasty is in place. Surgical staples are noted. There is no fracture or hardware complication. Gas in the soft tissues from surgery is noted. IMPRESSION: Status post right total knee replacement.  No acute abnormality. Electronically Signed   By: Inge Rise M.D.   On: 10/05/2016 10:48    Disposition: 01-Home or [place], MD Follow up in 2 week(s).   Specialty:  Orthopedic Surgery Contact information: [address] [place] [address] [phone]            Signed: Dorise Hiss [facility] 10/08/2016, 12:28 PM

## 2016-10-07 NOTE — Discharge Instructions (Signed)

## 2016-10-07 NOTE — Progress Notes (Signed)
Physical Therapy Treatment Patient Details Name: Garrett Woods MRN: [id number] DOB: 08/14/1956 Today's Date: 10/07/2016    History of Present Illness admitted for acute hospitalization status post R TKR, WBAT (10/05/16).  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    PT Comments    Initial attempt, pt notes rough day with increased pain and awaiting pain medication. Pt requests to wait. Second attempt pt agreeable and notes mild decrease in pain from 7 to 6/10 in R knee. Pt demonstrating some decreased range in right knee, ambulation distance and ease with mobility in general today due to pain. Pt guards Right lower extremity throughout session. Pt does perform 1 step today with good safety and control. Encouraged improved step lengths with ambulation (smaller on right and step through on left) for improved reciprocal pattern and better ability to de weight Right lower extremity with upper extremity use/less strain on right with step. Pt inconsistently demonstrates. Pt wishes return to bed this morn; will encourage up to chair in afternoon session and continue progression of strength, range, endurance to improve functional mobility.   Follow Up Recommendations  Home health PT     Equipment Recommendations  Rolling walker with 5" wheels    Recommendations for Other Services       Precautions / Restrictions Restrictions Weight Bearing Restrictions: Yes RLE Weight Bearing: Weight bearing as tolerated    Mobility  Bed Mobility Overal bed mobility: Needs Assistance Bed Mobility: Sit to Supine;Supine to Sit     Supine to sit: Min assist;HOB elevated Sit to supine: Min assist;HOB elevated   General bed mobility comments: Assit for RLE; increased time  Transfers Overall transfer level: Needs assistance Equipment used: Rolling walker (2 wheeled) Transfers: Sit  to/from Stand Sit to Stand: Min guard         General transfer comment: Mild unsteady due to decreased use of RLE; encouraged use as well as static stand with weigth shifting before ambulation.   Ambulation/Gait Ambulation/Gait assistance: Min guard Ambulation Distance (Feet): 54 Feet Assistive device: Rolling walker (2 wheeled) Gait Pattern/deviations: Step-to pattern;Decreased weight shift to right;Decreased dorsiflexion - right;Antalgic (Stiff leg pattern) Gait velocity: slow Gait velocity interpretation: <1.8 ft/sec, indicative of risk for recurrent falls General Gait Details: Slow antalgic gait with stiff RLE   Stairs Stairs: Yes   Stair Management: Two rails;Step to pattern;Forwards;Backwards Number of Stairs: 1 General stair comments: Ascend forward and descend backward. Good control/safety.  Wheelchair Mobility    Modified Rankin (Stroke Patients Only)       Balance Overall balance assessment: Needs assistance   Sitting balance-[name]: Good     Standing balance support: Bilateral upper extremity supported Standing balance-[name]: Fair                      Cognition Arousal/Alertness: Awake/alert Behavior During Therapy: WFL for tasks assessed/performed Overall Cognitive Status: Within Functional Limits for tasks assessed                      Exercises Total Joint Exercises Ankle Circles/Pumps: AROM;Both;20 reps;Supine (Difficulty on R due to neuropathy) Quad Sets: Strengthening;Both;20 reps;Supine (also 10 in stand) Knee Flexion: AROM;Right;Seated;5 reps (2 positions each rep with 10 second hold) Goniometric ROM: 5-67    General Comments        Pertinent Vitals/Pain Pain Assessment: 0-10 Pain Score: 7  Pain Location:  R knee Pain Descriptors / Indicators: Constant;Aching;Guarding;Operative site guarding Pain Intervention(s): Limited activity within patient's tolerance;Ice applied    Home Living                       Prior Function            PT Goals (current goals can now be found in the care plan section) Progress towards PT goals: Progressing toward goals    Frequency    BID      PT Plan Current plan remains appropriate    Co-evaluation             End of Session Equipment Utilized During Treatment: Gait belt Activity Tolerance: Patient limited by pain;Patient limited by fatigue Patient left: in bed;with call bell/phone within reach;with SCD's reapplied;Other (comment) (polar care; refuses alarm, but will call for assist)     Time: [id number] PT Time Calculation (min) (ACUTE ONLY): 29 min  Charges:  $Gait Training: 8-22 mins $Therapeutic Exercise: 8-22 mins                    G Codes:      Larae Grooms, PTA 10/07/2016, 12:08 PM

## 2016-10-07 NOTE — Care Management (Addendum)
[facility] has declined to accept patient. Spoke with  Christella Scheuermann to secure a new provider.

## 2016-10-07 NOTE — Care Management (Addendum)
Received approval from [facility] for [facility] PT with [facility] ([facility]). [facility] notified of discharge and patient updated. Cost of Lovenox is $31.77.

## 2016-10-07 NOTE — Care Management (Addendum)
Spoke with [facility]. They are not able to schedule patient for OP PT until Dec. 21.  Received call from [facility]. They have arranged home health PT with [facility] to begin on Monday Dec. 11. [facility] states they have arranged the home health with [facility] . Dr. Rudene Christians updated. Patient updated and agrees with POC.

## 2016-10-07 NOTE — Anesthesia Postprocedure Evaluation (Signed)
Anesthesia Post Note  Patient: Garrett Woods  Procedure(s) Performed: Procedure(s) (LRB): TOTAL KNEE ARTHROPLASTY (Right)  Patient location during evaluation: PACU Anesthesia Type: General Level of consciousness: awake and alert and oriented Pain management: pain level controlled Vital Signs Assessment: post-procedure vital signs reviewed and stable Respiratory status: spontaneous breathing Cardiovascular status: blood pressure returned to baseline Anesthetic complications: no    Last Vitals:  Vitals:   10/07/16 1135 10/07/16 1447  BP:  113/64  Pulse: 80 92  Resp:    Temp:      Last Pain:  Vitals:   10/07/16 1108  TempSrc:   PainSc: 2                  Cassadie Pankonin

## 2016-10-08 LAB — URIC ACID: Uric Acid, Serum: 7.9 mg/dL — ABNORMAL HIGH (ref 4.4–7.6)

## 2016-10-08 MED ORDER — PREDNISONE 10 MG PO TABS: 10.0000 mg | ORAL_TABLET | Freq: Every day | ORAL | 0 refills | Status: DC

## 2016-10-08 MED ORDER — COLCHICINE 0.6 MG PO TABS
1.2000 mg | ORAL_TABLET | Freq: Once | ORAL | Status: AC
  Administered 2016-10-08: 1.2 mg via ORAL
  Filled 2016-10-08: qty 2

## 2016-10-08 MED ORDER — COLCHICINE 0.6 MG PO TABS
0.6000 mg | ORAL_TABLET | Freq: Once | ORAL | Status: AC
  Administered 2016-10-08: 0.6 mg via ORAL
  Filled 2016-10-08: qty 1

## 2016-10-08 MED ORDER — DEXAMETHASONE SODIUM PHOSPHATE 4 MG/ML IJ SOLN
10.0000 mg | Freq: Once | INTRAMUSCULAR | Status: AC
  Administered 2016-10-08: 10 mg via INTRAVENOUS
  Filled 2016-10-08: qty 3

## 2016-10-08 NOTE — Care Management (Signed)
Confirmed with [facility] in [place] [id number] that they will see patient for HHPT on Tuesday 10/12/16 and that they did received home health referral from [facility]. Case closed.

## 2016-10-08 NOTE — Progress Notes (Signed)
Patient is discharged home with wife. Reviewed lovenox kits, wife was able to give husband the injection, scripts and last dose given. IV removed with cath intact. Allowed time for questions.

## 2016-10-08 NOTE — Progress Notes (Signed)
Physical Therapy Treatment Patient Details Name: Garrett Woods MRN: [id number] DOB: December 25, 1955 Today's Date: 10/08/2016    History of Present Illness admitted for acute hospitalization status post R TKR, WBAT (10/05/16).  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    PT Comments    Improved pain control and overall activity tolerance this PM.  Able to complete all transfers and mobility with bariatric RW and cga/close sup.  Fair/good weight acceptance to R LE with all mobility; no buckling or LOB. Verbalizes comfort with all mobility required for entry/exit of home, household mobilization; no further questions at this time. Patient remains appropriate for discharge home with HHPT services.  Good awareness of safety needs, mobility progression and overall mobility goals.   Follow Up Recommendations  Home health PT     Equipment Recommendations  Rolling walker with 5" wheels    Recommendations for Other Services       Precautions / Restrictions Precautions Precautions: Fall Precaution Comments: MRSA contact precautions Restrictions Weight Bearing Restrictions: Yes RLE Weight Bearing: Weight bearing as tolerated    Mobility  Bed Mobility Overal bed mobility: Needs Assistance Bed Mobility: Supine to Sit;Sit to Supine     Supine to sit: Supervision Sit to supine: Supervision   General bed mobility comments: using L LE to 'hook' R LE with all bed mobility efforts.  Instructed in hand placement with bed mobility to promote anterior weight translation  Transfers Overall transfer level: Needs assistance Equipment used: Rolling walker (2 wheeled) Transfers: Sit to/from Stand Sit to Stand: Min guard;Supervision         General transfer comment: heavy use of UEs to assist with transfer; maintains R LE anterior to BOS  Ambulation/Gait Ambulation/Gait  assistance: Min guard;Supervision Ambulation Distance (Feet): 75 Feet Assistive device: Rolling walker (2 wheeled)       General Gait Details: step to gait pattern leading with R LE; persistent R LE drop foot (may benefit from use of AFO); maintains R LE in generalized extension throughout gait cycle with limited knee flexion noted   Stairs            Wheelchair Mobility    Modified Rankin (Stroke Patients Only)       Balance Overall balance assessment: Needs assistance Sitting-balance support: No upper extremity supported;Feet supported Sitting balance-[name]: Good     Standing balance support: Bilateral upper extremity supported Standing balance-[name]: Good                      Cognition Arousal/Alertness: Awake/alert Behavior During Therapy: WFL for tasks assessed/performed Overall Cognitive Status: Within Functional Limits for tasks assessed                      Exercises Total Joint Exercises Goniometric ROM: 4-81 degrees Other Exercises Other Exercises: Toilet transfer, ambulatory with RW, cga/close sup; sit/stand from standard toilet with grab bar, distant sup/mod indep.  Maintains R LE anterior to BOS with all movement transitions; very guarded  Verbally reviewed car transfer technique for performance upon discharge; patient indep verbalizes technique without additional questions.    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 PT Goal Formulation: With patient/family Time  For Goal Achievement: 10/19/16 Potential to Achieve Goals: Good Progress towards PT goals: Progressing toward goals    Frequency    BID      PT Plan Current plan remains appropriate    Co-evaluation             End of Session Equipment Utilized During Treatment: Gait belt Activity Tolerance: Patient limited by pain Patient left: in bed;with call bell/phone within reach;with bed alarm set     Time: 1444-1511 PT Time Calculation (min) (ACUTE ONLY): 27 min  Charges:  $Gait Training: 8-22 mins $Therapeutic Activity: 8-22 mins                    G Codes:       Zenita Kister H. Owens Shark, PT, DPT, NCS 10/08/16, 3:53 PM [phone]

## 2016-10-08 NOTE — Progress Notes (Signed)
Physical Therapy Treatment Patient Details Name: Garrett Woods MRN: [id number] DOB: 1955/12/10 Today's Date: 10/08/2016    History of Present Illness admitted for acute hospitalization status post R TKR, WBAT (10/05/16).  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    PT Comments    Patient with apparent gout flare to R ankle; declines OOB activities due to pain this date. Agreeable to supine therex to R LE, but demonstrates decreased active strength, decreased ROM throughout R LE this AM. Reinforced need for OOB activities this PM to assess continued mobility/deficits for discharge planning purposes (to ensure ability to negotiate home environment given new pain); patient voiced understanding.   Follow Up Recommendations  Home health PT     Equipment Recommendations  Rolling walker with 5" wheels    Recommendations for Other Services       Precautions / Restrictions Precautions Precautions: Fall Precaution Comments: MRSA contact precautions Restrictions Weight Bearing Restrictions: Yes RLE Weight Bearing: Weight bearing as tolerated    Mobility  Bed Mobility               General bed mobility comments: Patient declined OOB attempts this AM due to pain in R knee, ankle  Transfers                 General transfer comment: Patient declined OOB attempts this AM due to pain in R knee, ankle  Ambulation/Gait             General Gait Details: Patient declined OOB attempts this AM due to pain in R knee, ankle   Stairs            Wheelchair Mobility    Modified Rankin (Stroke Patients Only)       Balance                                    Cognition Arousal/Alertness: Awake/alert Behavior During Therapy: WFL for tasks assessed/performed Overall Cognitive Status: Within Functional Limits for tasks  assessed                      Exercises Other Exercises Other Exercises: Supine R LE therex, 1x20, act assist ROM: quad sets, SAQs, heel slides, hip abduct/adduct and SLR.  Decreased active R quad contraction compared to previous sessions; unable to complete any therex (esp SLR) without assist this date.  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: With patient/family Time For Goal Achievement: 10/19/16 Potential to Achieve Goals: Good Progress towards PT goals: Not progressing toward goals - comment (limited progression due to R knee pain, R ankle gout flare)    Frequency  BID      PT Plan Current plan remains appropriate (will continue to monitor in light of increasing pain, decreasing activity tolerance )    Co-evaluation             End of Session   Activity Tolerance: Patient limited by pain Patient left: in bed;with call bell/phone within reach;with bed alarm set     Time: 0921-0939 PT Time Calculation (min) (ACUTE ONLY): 18 min  Charges:  $Therapeutic Exercise: 8-22 mins                    G Codes:      Tristyn Pharris H. Owens Shark, PT, DPT, NCS 10/08/16, 10:46 AM [phone]

## 2016-10-08 NOTE — Progress Notes (Signed)
   Subjective: 3 Days Post-Op Procedure(s) (LRB): TOTAL KNEE ARTHROPLASTY (Right) Patient reports pain as 6 on 0-10 scale.  Severe pain right ankle, history of gout. No calf pain. Patient is well, and has had no acute complaints or problems Denies any CP, SOB, ABD pain. We will continue therapy today.  Plan is to go Home after hospital stay.  Objective: Vital signs in last 24 hours: Temp:  [97.6 F (36.4 C)-98.9 F (37.2 C)] 97.6 F (36.4 C) (12/08 0441) Pulse Rate:  [77-92] 87 (12/08 0441) Resp:  [16-19] 16 (12/08 0441) BP: (108-116)/(60-67) 108/61 (12/08 0441) SpO2:  [92 %-98 %] 94 % (12/08 0441)  Intake/Output from previous day: 12/07 0701 - 12/08 0700 In: 480 [P.O.:480] Out: 1800 [Urine:1800] Intake/Output this shift: No intake/output data recorded.   Recent Labs  10/05/16 1230 10/06/16 0328 10/07/16 0427  HGB 12.2* 10.8* 10.9*    Recent Labs  10/06/16 0328 10/07/16 0427  WBC 12.8* 10.3  RBC 3.59* 3.60*  HCT 31.7* 31.7*  PLT 231 193    Recent Labs  10/06/16 0328 10/07/16 0427  NA 137 136  K 4.2 3.7  CL 104 100*  CO2 27 29  BUN 16 19  CREATININE 1.01 0.98  GLUCOSE 139* 107*  CALCIUM 8.8* 8.9   No results for input(s): LABPT, INR in the last 72 hours.  EXAM General - Patient is Alert, Appropriate and Oriented Extremity - Neurovascular intact Sensation intact distally Intact pulses distally Dorsiflexion/Plantar flexion intact No cellulitis present Compartment soft  Right ankle swollen, warm, tender. Dressing - moderate drainage and wound vac intact. 250 cc output. Wound vac d/c, honey comb applied Motor Function - intact, moving foot and toes well on exam.   Past Medical History:  Diagnosis Date  . Arthritis   . Elevated PSA   . Gout   . Heart murmur   . History of chicken pox   . Hyperlipemia   . Hypertension    Essential  . Microscopic hematuria   . Myasthenia gravis ([place])   . Pacemaker   . Prostate cancer (HCC)    Radiation     Assessment/Plan:   3 Days Post-Op Procedure(s) (LRB): TOTAL KNEE ARTHROPLASTY (Right) Active Problems:   Primary osteoarthritis of right knee  Estimated body mass index is 44.76 kg/m as calculated from the following:   Height as of this encounter: 6' (1.829 m).   Weight as of this encounter: 149.7 kg (330 lb). Advance diet Up with therapy  Acute post op blood loss anemia - Hgb stable and trending up Will check Uric acid CM to assist with discharge to home today.  Follow up with [facility] in 2 weeks DVT Prophylaxis - Lovenox, Foot Pumps and TED hose Weight-Bearing as tolerated to right leg   T. Rachelle Hora, PA-C [place] 10/08/2016, 7:56 AM

## 2016-11-29 ENCOUNTER — Other Ambulatory Visit: Payer: Self-pay | Admitting: Family Medicine

## 2016-12-27 ENCOUNTER — Other Ambulatory Visit: Payer: Self-pay

## 2016-12-27 MED ORDER — LOVASTATIN 40 MG PO TABS: 40.0000 mg | ORAL_TABLET | Freq: Every day | ORAL | 0 refills | Status: DC

## 2016-12-27 NOTE — Telephone Encounter (Signed)
Pharmacy requesting refills. Patient has an appt scheduled with you on 01/05/17.

## 2017-01-05 ENCOUNTER — Ambulatory Visit (INDEPENDENT_AMBULATORY_CARE_PROVIDER_SITE_OTHER): Payer: 59 | Admitting: Family Medicine

## 2017-01-05 ENCOUNTER — Encounter: Payer: Self-pay | Admitting: Family Medicine

## 2017-01-05 VITALS — BP 132/88 | HR 77 | Temp 98.0°F | Resp 16 | Wt 349.0 lb

## 2017-01-05 DIAGNOSIS — R739 Hyperglycemia, unspecified: Secondary | ICD-10-CM

## 2017-01-05 DIAGNOSIS — I10 Essential (primary) hypertension: Secondary | ICD-10-CM | POA: Diagnosis not present

## 2017-01-05 DIAGNOSIS — M1711 Unilateral primary osteoarthritis, right knee: Secondary | ICD-10-CM | POA: Diagnosis not present

## 2017-01-05 DIAGNOSIS — E7800 Pure hypercholesterolemia, unspecified: Secondary | ICD-10-CM

## 2017-01-05 DIAGNOSIS — Z1211 Encounter for screening for malignant neoplasm of colon: Secondary | ICD-10-CM

## 2017-01-05 DIAGNOSIS — C61 Malignant neoplasm of prostate: Secondary | ICD-10-CM

## 2017-01-05 DIAGNOSIS — G7000 Myasthenia gravis without (acute) exacerbation: Secondary | ICD-10-CM

## 2017-01-05 NOTE — Progress Notes (Signed)
Patient: Garrett Woods Male    DOB: Apr 27, 1956   61 y.o.   MRN: [id number] Visit Date: 01/05/2017  Today's Provider: Lelon Huh, MD   Chief Complaint  Patient presents with  . Hypertension    follow up  . Hyperlipidemia    follow up   Subjective:    HPI  Hypertension, follow-up:  BP Readings from Last 3 Encounters:  10/08/16 (!) 112/54  09/22/16 118/71  07/02/16 128/79    He was last seen for hypertension more than 1 years ago.  He reports good compliance with treatment. He is not having side effects.  He is exercising (Stationary bike and walking) He is not adherent to low salt diet.   Outside blood pressures are not being checked. He is experiencing none.  Patient denies chest pain, chest pressure/discomfort, claudication, dyspnea, exertional chest pressure/discomfort, fatigue, irregular heart beat, lower extremity edema, near-syncope, orthopnea, palpitations, paroxysmal nocturnal dyspnea, syncope and tachypnea.   Cardiovascular risk factors include advanced age (older than 44 for men, 63 for women), dyslipidemia, hypertension, male gender and obesity (BMI >= 30 kg/m2).  Use of agents associated with hypertension: NSAIDS.     Weight trend: increasing steadily Wt Readings from Last 3 Encounters:  10/05/16 (!) 330 lb (149.7 kg)  09/22/16 (!) 330 lb (149.7 kg)  07/02/16 (!) 360 lb (163.3 kg)    Current diet: well balanced  ------------------------------------------------------------------------  Lipid/Cholesterol, Follow-up:   Last seen for this more than 1 years ago.  . Last Lipid Panel:    Component Value Date/Time   CHOL 163 10/17/2014   TRIG 138 10/17/2014   HDL 37 10/17/2014   LDLCALC 98 10/17/2014    Risk factors for vascular disease include hypercholesterolemia and hypertension  He reports good compliance with treatment. He is not having side effects.  Current symptoms include none and have been stable. Weight trend: increasing  steadily Prior visit with dietician: no Current diet: well balanced Current exercise: walking  Wt Readings from Last 3 Encounters:  10/05/16 (!) 330 lb (149.7 kg)  09/22/16 (!) 330 lb (149.7 kg)  07/02/16 (!) 360 lb (163.3 kg)    -------------------------------------------------------------------     No Known Allergies   Current Outpatient Prescriptions:  .  acetaminophen (TYLENOL) 500 MG tablet, Take 1,000 mg by mouth every 6 (six) hours as needed for mild pain., Disp: , Rfl:  .  ASPIRIN 81 PO, Take 1 tablet by mouth daily., Disp: , Rfl:  .  fluorouracil (EFUDEX) 5 % cream, Apply 1 application topically 2 (two) times daily. Apply for 2 weeks  Patient has about a week left to take, Disp: , Rfl:  .  ibuprofen (ADVIL,MOTRIN) 200 MG tablet, Take 400 mg by mouth every 6 (six) hours as needed for mild pain., Disp: , Rfl:  .  losartan-hydrochlorothiazide (HYZAAR) 100-25 MG tablet, TAKE 1 TABLET BY MOUTH DAILY, Disp: 30 tablet, Rfl: 5 .  lovastatin (MEVACOR) 40 MG tablet, Take 1 tablet (40 mg total) by mouth daily., Disp: 90 tablet, Rfl: 0  Review of Systems  Constitutional: Negative for appetite change, chills and fever.  Respiratory: Negative for chest tightness, shortness of breath and wheezing.   Cardiovascular: Negative for chest pain and palpitations.  Gastrointestinal: Negative for abdominal pain, nausea and vomiting.    Social History  Substance Use Topics  . Smoking status: Former Smoker    Packs/day: 0.00    Years: 20.00    Quit date: 11/02/2003  . Smokeless tobacco: Former  User  . Alcohol use 0.0 oz/week     Comment: 2 drinks every weekend   Objective:   BP 132/88 (BP Location: Left Arm, Patient Position: Sitting, Cuff Size: Large)   Pulse 77   Temp 98 F (36.7 C) (Oral)   Resp 16   Wt (!) 349 lb (158.3 kg)   SpO2 95% Comment: room air  BMI 47.33 kg/m     Physical Exam  General Appearance:    Alert, cooperative, no distress, obese  Eyes:    PERRL,  conjunctiva/corneas clear, EOM's intact       Lungs:     Clear to auscultation bilaterally, respirations unlabored  Heart:    Regular rate and rhythm  Neurologic:   Awake, alert, oriented x 3. No apparent focal neurological           defect.           Assessment & Plan:     1. Essential (primary) hypertension Well controlled.   Current Outpatient Prescriptions  Medication Sig Dispense Refill  . acetaminophen (TYLENOL) 500 MG tablet Take 1,000 mg by mouth every 6 (six) hours as needed for mild pain.    . ASPIRIN 81 PO Take 1 tablet by mouth daily.    . fluorouracil (EFUDEX) 5 % cream Apply 1 application topically 2 (two) times daily. Apply for 2 weeks  Patient has about a week left to take    . ibuprofen (ADVIL,MOTRIN) 200 MG tablet Take 400 mg by mouth every 6 (six) hours as needed for mild pain.    Marland Kitchen losartan-hydrochlorothiazide (HYZAAR) 100-25 MG tablet TAKE 1 TABLET BY MOUTH DAILY 30 tablet 5  . lovastatin (MEVACOR) 40 MG tablet Take 1 tablet (40 mg total) by mouth daily. 90 tablet 0   No current facility-administered medications for this visit.     - Comprehensive metabolic panel  2. Primary osteoarthritis of right knee   3. Obesity, morbid (more than 100 lbs over ideal weight or BMI > 40) (HCC) Discussed diet and exercise  4. Pure hypercholesterolemia He is tolerating lovastatin well with no adverse effects.   - Comprehensive metabolic panel - Lipid panel  5. Colon cancer screening  - Ambulatory referral to Gastroenterology  6. Myasthenia gravis ([place]) Stable, currently off of medications.   7. Hyperglycemia  - Hemoglobin A1c  8. Prostate cancer ([place]) Continues regular follow up with Dr. Baruch Gouty.        Lelon Huh, MD  [place]

## 2017-01-06 ENCOUNTER — Telehealth: Payer: Self-pay

## 2017-01-06 ENCOUNTER — Encounter: Payer: Self-pay | Admitting: Family Medicine

## 2017-01-06 ENCOUNTER — Other Ambulatory Visit: Payer: Self-pay

## 2017-01-06 DIAGNOSIS — Z1211 Encounter for screening for malignant neoplasm of colon: Secondary | ICD-10-CM

## 2017-01-06 LAB — COMPREHENSIVE METABOLIC PANEL
ALBUMIN: 4.4 g/dL (ref 3.6–4.8)
ALT: 19 IU/L (ref 0–44)
AST: 21 IU/L (ref 0–40)
Albumin/Globulin Ratio: 1.6 (ref 1.2–2.2)
Alkaline Phosphatase: 65 IU/L (ref 39–117)
BILIRUBIN TOTAL: 0.6 mg/dL (ref 0.0–1.2)
BUN / CREAT RATIO: 12 (ref 10–24)
BUN: 12 mg/dL (ref 8–27)
CHLORIDE: 101 mmol/L (ref 96–106)
CO2: 24 mmol/L (ref 18–29)
CREATININE: 1.02 mg/dL (ref 0.76–1.27)
Calcium: 9.9 mg/dL (ref 8.6–10.2)
GFR, EST AFRICAN AMERICAN: 91 mL/min/{1.73_m2} (ref >59)
GFR, EST NON AFRICAN AMERICAN: 78 mL/min/{1.73_m2} (ref >59)
GLUCOSE: 95 mg/dL (ref 65–99)
Globulin, Total: 2.7 g/dL (ref 1.5–4.5)
Potassium: 4.4 mmol/L (ref 3.5–5.2)
Sodium: 141 mmol/L (ref 134–144)
TOTAL PROTEIN: 7.1 g/dL (ref 6.0–8.5)

## 2017-01-06 LAB — LIPID PANEL
CHOL/HDL RATIO: 5.4 ratio — AB (ref 0.0–5.0)
Cholesterol, Total: 199 mg/dL (ref 100–199)
HDL: 37 mg/dL — ABNORMAL LOW (ref >39)
LDL CALC: 125 mg/dL — AB (ref 0–99)
Triglycerides: 185 mg/dL — ABNORMAL HIGH (ref 0–149)
VLDL CHOLESTEROL CAL: 37 mg/dL (ref 5–40)

## 2017-01-06 LAB — HEMOGLOBIN A1C
ESTIMATED AVERAGE GLUCOSE: 111 mg/dL
HEMOGLOBIN A1C: 5.5 % (ref 4.8–5.6)

## 2017-01-06 NOTE — Telephone Encounter (Signed)
Gastroenterology Pre-Procedure Review  Request Date: 02/07/17 Requesting Physician: Dr. Allen Norris  PATIENT REVIEW QUESTIONS: The patient responded to the following health history questions as indicated:    1. Are you having any GI issues? no 2. Do you have a personal history of Polyps? yes (removed) 3. Do you have a family history of Colon Cancer or Polyps? no 4. Diabetes Mellitus? no 5. Joint replacements in the past 12 months?yes (Knee replacement: antiobiotics required) 6. Major health problems in the past 3 months?no 7. Any artificial heart valves, MVP, or defibrillator?yes (Pacemaker)    MEDICATIONS & ALLERGIES:    Patient reports the following regarding taking any anticoagulation/antiplatelet therapy:   Plavix, Coumadin, Eliquis, Xarelto, Lovenox, Pradaxa, Brilinta, or Effient? no Aspirin? yes (81mg )  Patient confirms/reports the following medications:  Current Outpatient Prescriptions  Medication Sig Dispense Refill  . acetaminophen (TYLENOL) 500 MG tablet Take 1,000 mg by mouth every 6 (six) hours as needed for mild pain.    . ASPIRIN 81 PO Take 1 tablet by mouth daily.    . fluorouracil (EFUDEX) 5 % cream Apply 1 application topically 2 (two) times daily. Apply for 2 weeks  Patient has about a week left to take    . ibuprofen (ADVIL,MOTRIN) 200 MG tablet Take 400 mg by mouth every 6 (six) hours as needed for mild pain.    Marland Kitchen losartan-hydrochlorothiazide (HYZAAR) 100-25 MG tablet TAKE 1 TABLET BY MOUTH DAILY 30 tablet 5  . lovastatin (MEVACOR) 40 MG tablet Take 1 tablet (40 mg total) by mouth daily. 90 tablet 0   No current facility-administered medications for this visit.     Patient confirms/reports the following allergies:  No Known Allergies  No orders of the defined types were placed in this encounter.   AUTHORIZATION INFORMATION Primary Insurance: 1D#: Group #:  Secondary Insurance: 1D#: Group #:  SCHEDULE INFORMATION: Date: 02/07/17 Time: Location: [place]

## 2017-01-28 ENCOUNTER — Encounter: Payer: Self-pay | Admitting: *Deleted

## 2017-02-01 ENCOUNTER — Encounter: Payer: Self-pay | Admitting: *Deleted

## 2017-02-04 NOTE — Discharge Instructions (Signed)

## 2017-02-07 ENCOUNTER — Encounter
Admission: RE | Disposition: A | Discharge status: Home / Self Care | Payer: Self-pay | Source: Ambulatory Visit | Attending: Gastroenterology

## 2017-02-07 ENCOUNTER — Ambulatory Visit
Admission: RE | Admit: 2017-02-07 | Discharge: 2017-02-07 | Disposition: A | Discharge status: Home / Self Care | Payer: 59 | Source: Ambulatory Visit | Attending: Gastroenterology | Admitting: Gastroenterology

## 2017-02-07 ENCOUNTER — Ambulatory Visit: Payer: 59 | Admitting: Anesthesiology

## 2017-02-07 DIAGNOSIS — Z950 Presence of cardiac pacemaker: Secondary | ICD-10-CM | POA: Insufficient documentation

## 2017-02-07 DIAGNOSIS — Z85828 Personal history of other malignant neoplasm of skin: Secondary | ICD-10-CM | POA: Diagnosis not present

## 2017-02-07 DIAGNOSIS — I10 Essential (primary) hypertension: Secondary | ICD-10-CM | POA: Insufficient documentation

## 2017-02-07 DIAGNOSIS — K5730 Diverticulosis of large intestine without perforation or abscess without bleeding: Secondary | ICD-10-CM | POA: Insufficient documentation

## 2017-02-07 DIAGNOSIS — Z8546 Personal history of malignant neoplasm of prostate: Secondary | ICD-10-CM | POA: Diagnosis not present

## 2017-02-07 DIAGNOSIS — Z85820 Personal history of malignant melanoma of skin: Secondary | ICD-10-CM | POA: Insufficient documentation

## 2017-02-07 DIAGNOSIS — E785 Hyperlipidemia, unspecified: Secondary | ICD-10-CM | POA: Diagnosis not present

## 2017-02-07 DIAGNOSIS — K627 Radiation proctitis: Secondary | ICD-10-CM

## 2017-02-07 DIAGNOSIS — Z96651 Presence of right artificial knee joint: Secondary | ICD-10-CM | POA: Insufficient documentation

## 2017-02-07 DIAGNOSIS — K5520 Angiodysplasia of colon without hemorrhage: Secondary | ICD-10-CM | POA: Insufficient documentation

## 2017-02-07 DIAGNOSIS — Z87891 Personal history of nicotine dependence: Secondary | ICD-10-CM | POA: Diagnosis not present

## 2017-02-07 DIAGNOSIS — G7000 Myasthenia gravis without (acute) exacerbation: Secondary | ICD-10-CM | POA: Insufficient documentation

## 2017-02-07 DIAGNOSIS — Z79899 Other long term (current) drug therapy: Secondary | ICD-10-CM | POA: Diagnosis not present

## 2017-02-07 DIAGNOSIS — Z7982 Long term (current) use of aspirin: Secondary | ICD-10-CM | POA: Insufficient documentation

## 2017-02-07 DIAGNOSIS — Z1211 Encounter for screening for malignant neoplasm of colon: Secondary | ICD-10-CM | POA: Insufficient documentation

## 2017-02-07 DIAGNOSIS — Z6841 Body Mass Index (BMI) 40.0 and over, adult: Secondary | ICD-10-CM | POA: Diagnosis not present

## 2017-02-07 DIAGNOSIS — Z923 Personal history of irradiation: Secondary | ICD-10-CM | POA: Diagnosis not present

## 2017-02-07 HISTORY — PX: COLONOSCOPY WITH PROPOFOL: SHX5780

## 2017-02-07 HISTORY — DX: Carrier or suspected carrier of methicillin resistant Staphylococcus aureus: Z22.322

## 2017-02-07 HISTORY — DX: Foot drop, right foot: M21.371

## 2017-02-07 SURGERY — COLONOSCOPY WITH PROPOFOL
Anesthesia: Monitor Anesthesia Care | Wound class: Contaminated

## 2017-02-07 MED ORDER — STERILE WATER FOR IRRIGATION IR SOLN
Status: DC | PRN
  Administered 2017-02-07: 10:00:00

## 2017-02-07 MED ORDER — LIDOCAINE HCL (CARDIAC) 20 MG/ML IV SOLN
INTRAVENOUS | Status: DC | PRN
  Administered 2017-02-07: 40 mg via INTRAVENOUS

## 2017-02-07 MED ORDER — PROPOFOL 10 MG/ML IV BOLUS
INTRAVENOUS | Status: DC | PRN
  Administered 2017-02-07 (×4): 50 mg via INTRAVENOUS

## 2017-02-07 MED ORDER — LACTATED RINGERS IV SOLN
INTRAVENOUS | Status: DC
  Administered 2017-02-07: 09:00:00 via INTRAVENOUS

## 2017-02-07 SURGICAL SUPPLY — 23 items

## 2017-02-07 NOTE — Anesthesia Preprocedure Evaluation (Signed)
Anesthesia Evaluation  Patient identified by MRN, date of birth, ID band Patient awake    Reviewed: Allergy & Precautions, H&P , NPO status , Patient's Chart, lab work & pertinent test results  Airway Mallampati: II  TM Distance: >3 FB Neck ROM: full    Dental no notable dental hx.    Pulmonary former smoker,    Pulmonary exam normal        Cardiovascular hypertension, Normal cardiovascular exam+ dysrhythmias + pacemaker      Neuro/Psych    GI/Hepatic   Endo/Other  Morbid obesity  Renal/GU      Musculoskeletal   Abdominal   Peds  Hematology   Anesthesia Other Findings   Reproductive/Obstetrics                             Anesthesia Physical Anesthesia Plan  ASA: III  Anesthesia Plan: MAC   Post-op Pain Management:    Induction:   Airway Management Planned:   Additional Equipment:   Intra-op Plan:   Post-operative Plan:   Informed Consent: I have reviewed the patients History and Physical, chart, labs and discussed the procedure including the risks, benefits and alternatives for the proposed anesthesia with the patient or authorized representative who has indicated his/her understanding and acceptance.     Plan Discussed with:   Anesthesia Plan Comments:         Anesthesia Quick Evaluation

## 2017-02-07 NOTE — H&P (Signed)
Lucilla Lame, MD [facility] [address], [place], [place] [address] Phone: [phone] Fax : [phone]  Primary Care Physician:  Lelon Huh, MD Primary Gastroenterologist:  Dr. Allen Norris  Pre-Procedure History & Physical: HPI:  Garrett Woods is a 61 y.o. male is here for a screening colonoscopy.   Past Medical History:  Diagnosis Date  . Arthritis   . Elevated PSA   . Foot drop, right    since spianl fusion 8/17  . Gout   . Heart murmur   . History of chicken pox   . Hyperlipemia   . Hypertension    Essential  . Microscopic hematuria   . MRSA (methicillin resistant staph aureus) culture positive    Nasal culture. Prior to Back surgery. Used "ointment"  . Myasthenia gravis ([place])   . Pacemaker    medtronic [id number] [place]  . Prostate cancer [facility])    Radiation    Past Surgical History:  Procedure Laterality Date  . BACK SURGERY  06/2016   lumbar fusion  . BASAL CELL CARCINOMA EXCISION  12/28/2012   Anterior neck, done by Dr. Lacinda Axon at [facility]  . CARPAL TUNNEL RELEASE Left   . COLONOSCOPY W/ POLYPECTOMY    . INSERT / REPLACE / REMOVE PACEMAKER  12/24/2014   Medtronic  . KNEE SURGERY Right 1988   Tear of LCL of knee  . MELANOMA EXCISION  2013,2015   X 2; Neck & Back  . TOTAL KNEE ARTHROPLASTY Right 10/05/2016   Procedure: TOTAL KNEE ARTHROPLASTY;  Surgeon: Hessie Knows, MD;  Location: [facility] ORS;  Service: Orthopedics;  Laterality: Right;  +MRSA PCR     Prior to Admission medications   Medication Sig Start Date End Date Taking? Authorizing Provider  acetaminophen (TYLENOL) 500 MG tablet Take 1,000 mg by mouth every 6 (six) hours as needed for mild pain.   Yes Historical Provider, MD  ASPIRIN 81 PO Take 1 tablet by mouth daily.   Yes Historical Provider, MD  fluorouracil (EFUDEX) 5 % cream Apply 1 application topically 2 (two) times daily. Apply for 2 weeks  Patient has about a week left to take   Yes Historical Provider, MD  Glucosamine-Chondroitin (OSTEO BI-FLEX  REGULAR STRENGTH PO) Take by mouth.   Yes Historical Provider, MD  ibuprofen (ADVIL,MOTRIN) 200 MG tablet Take 400 mg by mouth every 6 (six) hours as needed for mild pain.   Yes Historical Provider, MD  losartan-hydrochlorothiazide (HYZAAR) 100-25 MG tablet TAKE 1 TABLET BY MOUTH DAILY 09/24/16  Yes Birdie Sons, MD  lovastatin (MEVACOR) 40 MG tablet Take 1 tablet (40 mg total) by mouth daily. 12/27/16  Yes Birdie Sons, MD    Allergies as of 01/06/2017  . (No Known Allergies)    Family History  Problem Relation Age of Onset  . Heart failure Father   . Heart attack Father   . Hypertension Mother   . Heart Problems Mother   . Heart disease Other   . Hyperthyroidism Other   . Diabetes Other   . Cancer Neg Hx     Social History   Social History  . Marital status: Married    Spouse name: N/A  . Number of children: N/A  . Years of education: N/A   Occupational History  . [employer]     [employer]   Social History Main Topics  . Smoking status: Former Smoker    Packs/day: 0.00    Years: 20.00    Quit date: 11/02/2003  .  Smokeless tobacco: Former Systems developer  . Alcohol use 0.0 oz/week     Comment: 2 drinks every weekend  . Drug use: No  . Sexual activity: Not on file   Other Topics Concern  . Not on file   Social History Narrative  . No narrative on file    Review of Systems: See HPI, otherwise negative ROS  Physical Exam: BP 139/90   Pulse 69   Temp 97.9 F (36.6 C)   Resp 16   Ht 6' (1.829 m)   Wt (!) 340 lb (154.2 kg)   SpO2 98%   BMI 46.11 kg/m  General:   Alert,  pleasant and cooperative in NAD Head:  Normocephalic and atraumatic. Neck:  Supple; no masses or thyromegaly. Lungs:  Clear throughout to auscultation.    Heart:  Regular rate and rhythm. Abdomen:  Soft, nontender and nondistended. Normal bowel sounds, without guarding, and without rebound.   Neurologic:  Alert and  oriented x4;  grossly normal  neurologically.  Impression/Plan: Garrett Woods is now here to undergo a screening colonoscopy.  Risks, benefits, and alternatives regarding colonoscopy have been reviewed with the patient.  Questions have been answered.  All parties agreeable.

## 2017-02-07 NOTE — Transfer of Care (Signed)
Immediate Anesthesia Transfer of Care Note  Patient: Garrett Woods  Procedure(s) Performed: Procedure(s): COLONOSCOPY WITH PROPOFOL (N/A)  Patient Location: PACU  Anesthesia Type: MAC  Level of Consciousness: awake, alert  and patient cooperative  Airway and Oxygen Therapy: Patient Spontanous Breathing and Patient connected to supplemental oxygen  Post-op Assessment: Post-op Vital signs reviewed, Patient's Cardiovascular Status Stable, Respiratory Function Stable, Patent Airway and No signs of Nausea or vomiting  Post-op Vital Signs: Reviewed and stable  Complications: No apparent anesthesia complications

## 2017-02-07 NOTE — Anesthesia Postprocedure Evaluation (Signed)
Anesthesia Post Note  Patient: Garrett Woods  Procedure(s) Performed: Procedure(s) (LRB): COLONOSCOPY WITH PROPOFOL (N/A)  Patient location during evaluation: PACU Anesthesia Type: MAC Level of consciousness: awake and alert and oriented Pain management: satisfactory to patient Vital Signs Assessment: post-procedure vital signs reviewed and stable Respiratory status: spontaneous breathing, nonlabored ventilation and respiratory function stable Cardiovascular status: blood pressure returned to baseline and stable Postop Assessment: Adequate PO intake and No signs of nausea or vomiting Anesthetic complications: no    Raliegh Ip

## 2017-02-07 NOTE — Op Note (Signed)
[facility] Gastroenterology Patient Name: Garrett Woods Procedure Date: 02/07/2017 9:30 AM MRN: [id number] Account #: [id number] Date of Birth: 1956-05-12 Admit Type: Outpatient Age: 61 Room: [facility] OR ROOM 01 Gender: Male Note Status: Finalized Procedure:            Colonoscopy Indications:          Screening for colorectal malignant neoplasm Providers:            Lucilla Lame MD, MD Referring MD:         Kirstie Peri. Caryn Section, MD (Referring MD) Medicines:            Propofol per Anesthesia Complications:        No immediate complications. Procedure:            Pre-Anesthesia Assessment:                       - Prior to the procedure, a History and Physical was                        performed, and patient medications and allergies were                        reviewed. The patient's tolerance of previous                        anesthesia was also reviewed. The risks and benefits of                        the procedure and the sedation options and risks were                        discussed with the patient. All questions were                        answered, and informed consent was obtained. Prior                        Anticoagulants: The patient has taken no previous                        anticoagulant or antiplatelet agents. ASA Grade                        Assessment: II - A patient with mild systemic disease.                        After reviewing the risks and benefits, the patient was                        deemed in satisfactory condition to undergo the                        procedure.                       After obtaining informed consent, the colonoscope was                        passed under direct vision. Throughout the procedure,  the patient's blood pressure, pulse, and oxygen                        saturations were monitored continuously. The Olympus CF                        H180AL Colonoscope (S#: [id number]) was introduced  through                        the anus and advanced to the the cecum, identified by                        appendiceal orifice and ileocecal valve. The                        colonoscopy was performed without difficulty. The                        patient tolerated the procedure well. The quality of                        the bowel preparation was excellent. Findings:      The perianal and digital rectal examinations were normal.      Multiple patchy angiodysplastic lesions without bleeding were found in       the rectum.      A few small-mouthed diverticula were found in the entire colon. Impression:           consistent with radiation proctitis                       - Multiple non-bleeding colonic angiodysplastic lesions.                       - Diverticulosis in the entire examined colon.                       - No specimens collected. Recommendation:       - Discharge patient to home.                       - Resume previous diet.                       - Continue present medications.                       - Repeat colonoscopy in 10 years for screening unless                        any change in family history or lower GI problems. Procedure Code(s):    --- Professional ---                       [phone], Colonoscopy, flexible; diagnostic, including                        collection of specimen(s) by brushing or washing, when                        performed (separate procedure) Diagnosis Code(s):    --- Professional ---  Z12.11, Encounter for screening for malignant neoplasm                        of colon CPT copyright 2016 American Medical Association. All rights reserved. The codes documented in this report are preliminary and upon coder review may  be revised to meet current compliance requirements. Lucilla Lame MD, MD 02/07/2017 9:53:11 AM This report has been signed electronically. Number of Addenda: 0 Note Initiated On: 02/07/2017 9:30 AM Scope Withdrawal  Time: 0 hours 6 minutes 56 seconds  Total Procedure Duration: 0 hours 9 minutes 17 seconds       [facility]

## 2017-02-08 ENCOUNTER — Encounter: Payer: Self-pay | Admitting: Gastroenterology

## 2017-02-15 ENCOUNTER — Other Ambulatory Visit: Payer: Self-pay

## 2017-02-25 ENCOUNTER — Other Ambulatory Visit: Payer: Self-pay | Admitting: Family Medicine

## 2017-03-24 ENCOUNTER — Other Ambulatory Visit: Payer: Self-pay | Admitting: *Deleted

## 2017-03-24 MED ORDER — LOVASTATIN 40 MG PO TABS: 40.0000 mg | ORAL_TABLET | Freq: Every day | ORAL | 4 refills | Status: DC

## 2017-03-25 ENCOUNTER — Telehealth: Payer: Self-pay | Admitting: Family Medicine

## 2017-03-28 ENCOUNTER — Other Ambulatory Visit: Payer: Self-pay | Admitting: Family Medicine

## 2017-06-01 ENCOUNTER — Other Ambulatory Visit: Payer: Self-pay

## 2017-06-01 ENCOUNTER — Ambulatory Visit: Payer: Managed Care, Other (non HMO) | Admitting: Radiation Oncology

## 2017-06-01 ENCOUNTER — Inpatient Hospital Stay: Payer: 59 | Attending: Radiation Oncology

## 2017-06-01 DIAGNOSIS — C61 Malignant neoplasm of prostate: Secondary | ICD-10-CM | POA: Diagnosis present

## 2017-06-01 LAB — PSA: Prostatic Specific Antigen: 0.69 ng/mL (ref 0.00–4.00)

## 2017-06-08 ENCOUNTER — Ambulatory Visit
Admission: RE | Admit: 2017-06-08 | Discharge: 2017-06-08 | Disposition: A | Discharge status: Home / Self Care | Payer: 59 | Source: Ambulatory Visit | Attending: Radiation Oncology | Admitting: Radiation Oncology

## 2017-06-08 ENCOUNTER — Encounter: Payer: Self-pay | Admitting: Radiation Oncology

## 2017-06-08 VITALS — BP 146/102 | HR 82 | Temp 97.5°F | Resp 20 | Wt 361.1 lb

## 2017-06-08 DIAGNOSIS — C61 Malignant neoplasm of prostate: Secondary | ICD-10-CM | POA: Diagnosis present

## 2017-06-08 DIAGNOSIS — Z923 Personal history of irradiation: Secondary | ICD-10-CM | POA: Insufficient documentation

## 2017-06-08 NOTE — Progress Notes (Signed)
Radiation Oncology Follow up Note  Name: Garrett Woods   Date:   06/08/2017 MRN:  [id number] DOB: 06-02-1956    This 61 y.o. male presents to the clinic today for 2 year follow-up status post I MRT radiation therapy for Gleason 6 adenocarcinoma the prostate.  REFERRING PROVIDER: Birdie Sons, MD  HPI: patient is a 61 year old male now out 2 years having completed IM RT radiation therapy for a Gleason 6 adenocarcinoma the prostate presenting the PSA of 5. He is seen today in routine follow-up and is doing well. He specifically denies diarrhea dysuria or any other GI/GU complaints. His last PSA this month was 0.69.Marland Kitchen  COMPLICATIONS OF TREATMENT: none  FOLLOW UP COMPLIANCE: keeps appointments   PHYSICAL EXAM:  BP (!) 146/102   Pulse 82   Temp (!) 97.5 F (36.4 C)   Resp 20   Wt (!) 361 lb 1.8 oz (163.8 kg)   BMI 48.98 kg/m  On rectal exam rectal sphincter tone is good. Prostate is smooth contracted without evidence of nodularity or mass. Sulcus is preserved bilaterally. No discrete nodularity is identified. No other rectal abnormalities are noted. Well-developed well-nourished patient in NAD. HEENT reveals PERLA, EOMI, discs not visualized.  Oral cavity is clear. No oral mucosal lesions are identified. Neck is clear without evidence of cervical or supraclavicular adenopathy. Lungs are clear to A&P. Cardiac examination is essentially unremarkable with regular rate and rhythm without murmur rub or thrill. Abdomen is benign with no organomegaly or masses noted. Motor sensory and DTR levels are equal and symmetric in the upper and lower extremities. Cranial nerves II through XII are grossly intact. Proprioception is intact. No peripheral adenopathy or edema is identified. No motor or sensory levels are noted. Crude visual fields are within normal range.  RADIOLOGY RESULTS: no current films for review  PLAN: at the present time patient is doing well under good biochemical control of his  prostate cancer. I'm please was overall progress. I've asked to see him back in 1 year for follow-up and will obtain a PSA prior to that visit. Patient knows to call with any concerns.  I would like to take this opportunity to thank you for allowing me to participate in the care of your patient.Armstead Peaks., MD

## 2017-09-02 IMAGING — RF DG MYELOGRAPHY LUMBAR INJ LUMBOSACRAL
12 of 13 series · 12 of 13 positions shown · non-contrast
Comparison: 03/24/2015 plain film exam of the lumbar spine.
08/28/2015 outside MR.

CLINICAL DATA: 59-year-old hypertensive male with pacemaker in
place presenting with right lower back pain, right leg numbness and
shooting pain right lower leg 1 walking. Subsequent encounter.
TECHNIQUE: Contiguous axial images were obtained through the Lumbar spine after
the intrathecal infusion of infusion. Coronal and sagittal
reconstructions were obtained of the axial image sets.

[Series 1: cp_bariatric · 0.18mm/px · 1 of 1 slices shown (1 of 3)]
[im 1/1]
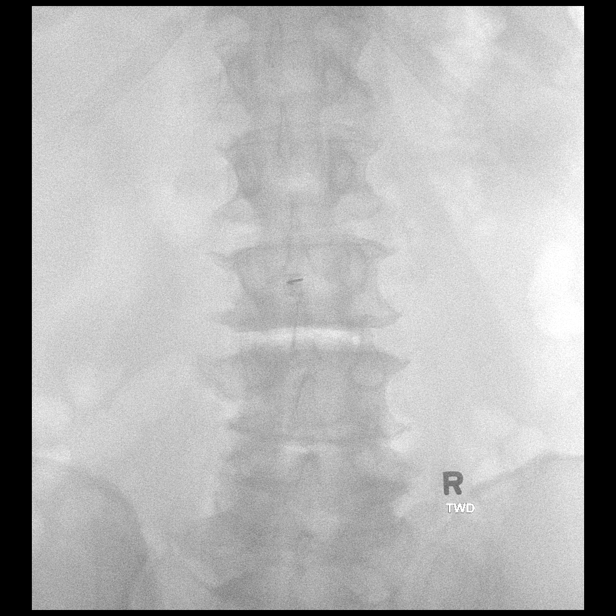

[Series 2: cp_bariatric · 0.18mm/px · 1 of 1 slices shown (2 of 3)]
[im 1/1]
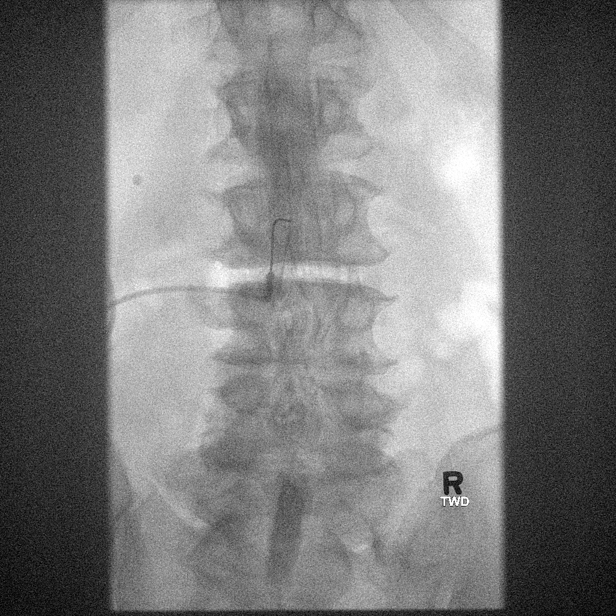

[Series 3: cp_bariatric · 0.18mm/px · 1 of 1 slices shown (3 of 3)]
[im 1/1]
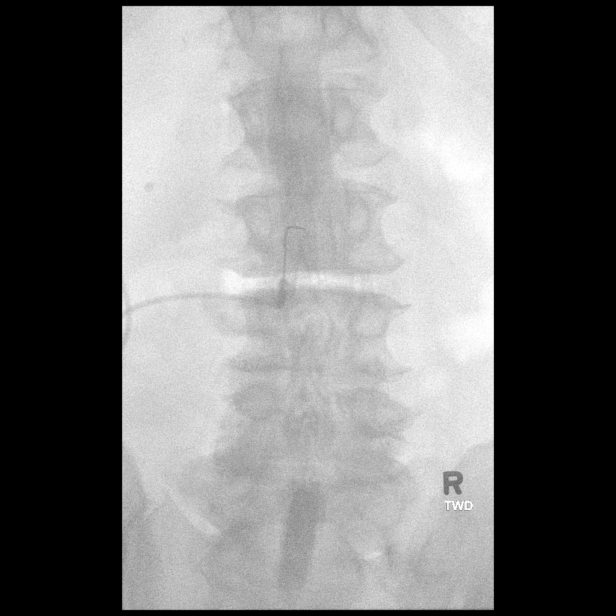

[Series 4: fluoro_iodine_bariatric 2fps_bw · 0.20mm/px · 1 of 1 slices shown (1 of 6)]
[im 1/1]
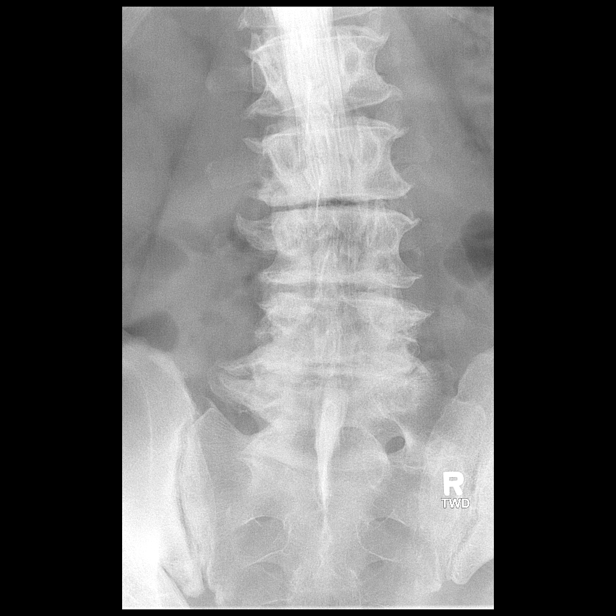

[Series 5: fluoro_iodine_bariatric 2fps_bw · 0.20mm/px · 1 of 1 slices shown (2 of 6)]
[im 1/1]
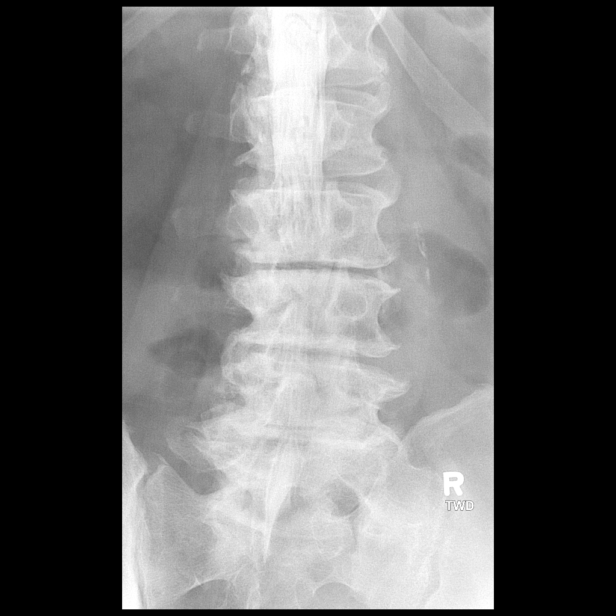

[Series 6: fluoro_iodine_bariatric 2fps_bw · 0.20mm/px · 1 of 1 slices shown (3 of 6)]
[im 1/1]
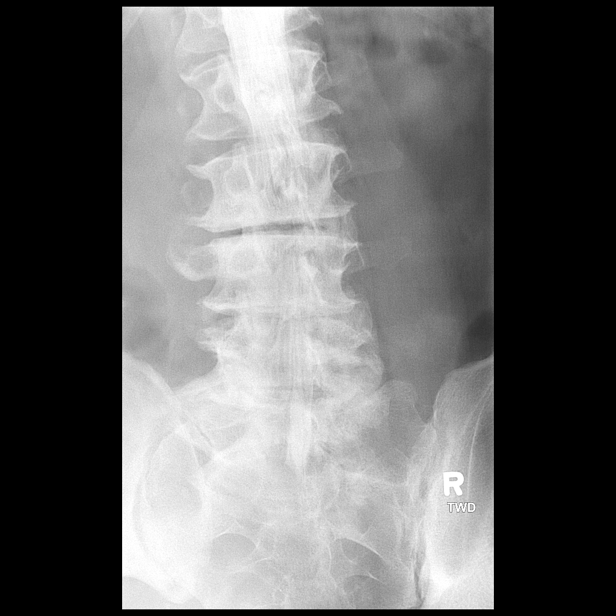

[Series 8: fluoro_iodine_bariatric 2fps_bw · 0.20mm/px · 1 of 1 slices shown (4 of 6)]
[im 1/1]
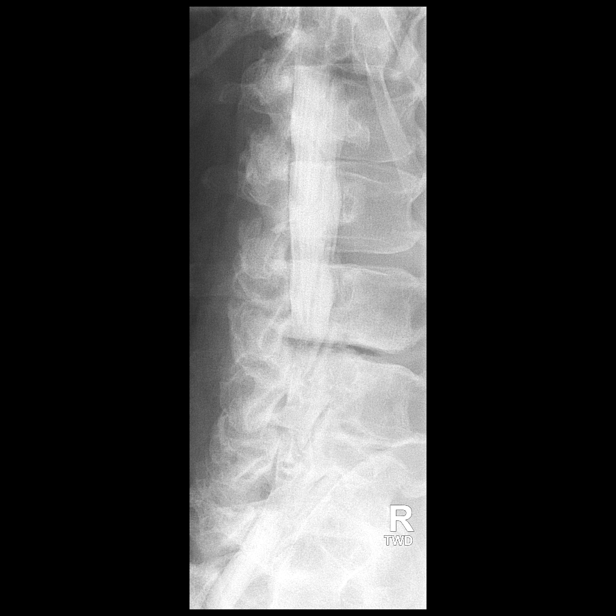

[Series 9: fluoro_iodine_bariatric 2fps_bw · 0.20mm/px · 1 of 1 slices shown (5 of 6)]
[im 1/1]
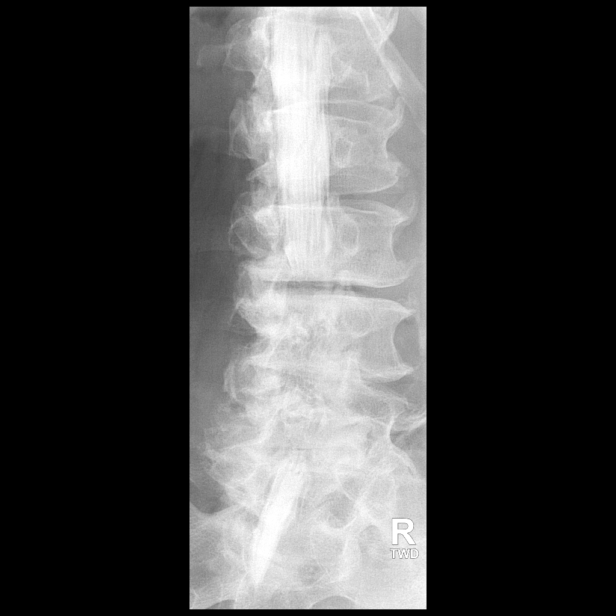

[Series 10: fluoro_iodine_bariatric 2fps_bw · 0.17mm/px · 1 of 1 slices shown (6 of 6)]
[im 1/1]
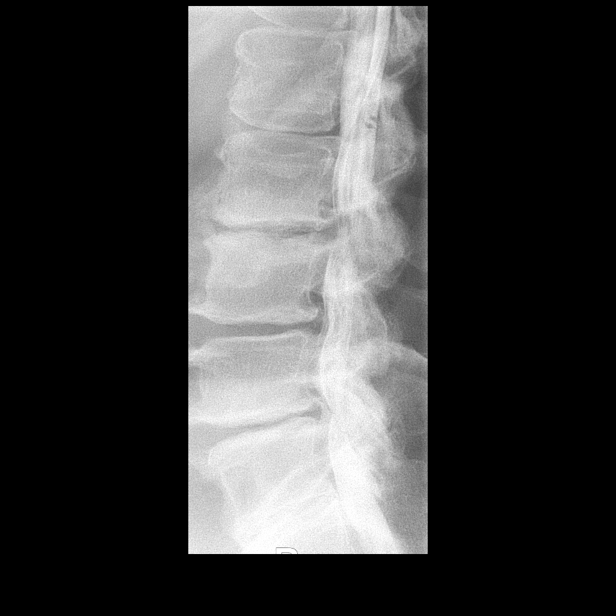

[Series 11: fluoro_barium_bariatric 2fps_bw · 0.17mm/px · 1 of 1 slices shown (1 of 3)]
[im 1/1]
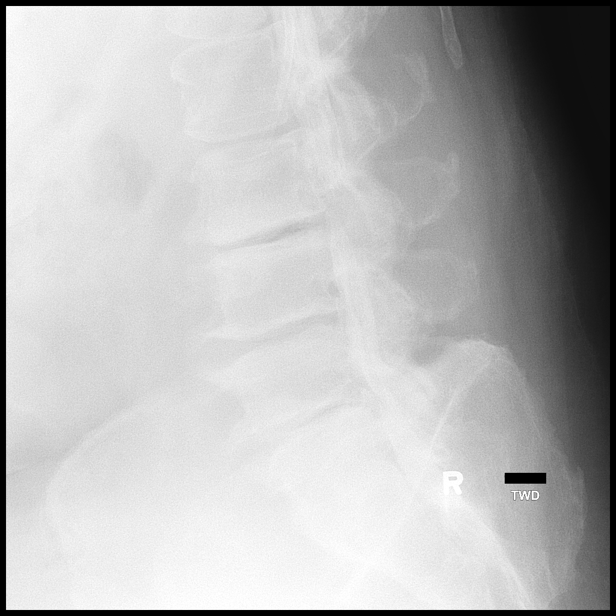

[Series 12: fluoro_barium_bariatric 2fps_bw · 0.17mm/px · 1 of 1 slices shown (2 of 3)]
[im 1/1]
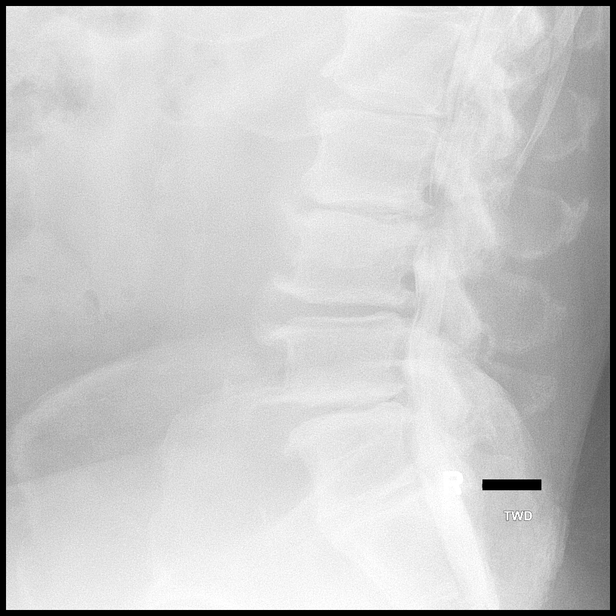

[Series 13: fluoro_barium_bariatric 2fps_bw · 0.17mm/px · 1 of 1 slices shown (3 of 3)]
[im 1/1]
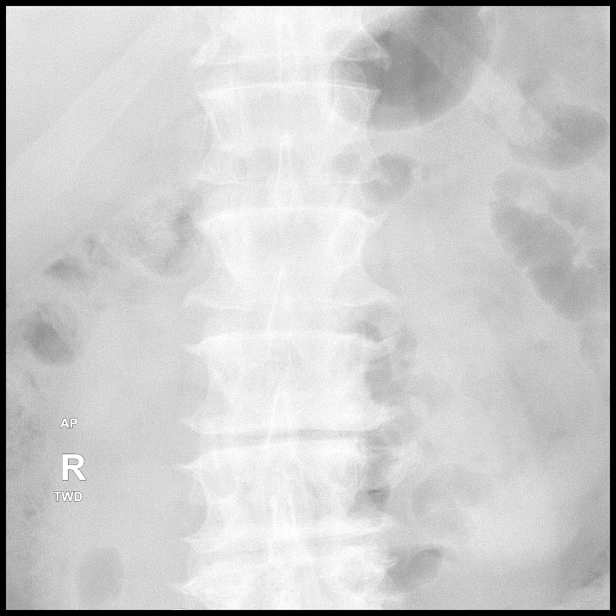

[12 of 13 positions shown; findings below may reference images not displayed]

EXAM:
LUMBAR MYELOGRAM

FLUOROSCOPY TIME:  2 minutes and 6 seconds.

216.6 micro Gray.

PROCEDURE:
After thorough discussion of risks and benefits of the procedure
including bleeding, infection, injury to nerves, blood vessels,
adjacent structures as well as headache and CSF leak, written and
oral informed consent was obtained. Consent was obtained by Dr.
Naotomo Evellyn. Time out form was completed.

Patient was positioned prone on the fluoroscopy table. Local
anesthesia was provided with 1% lidocaine without epinephrine after
prepped and draped in the usual sterile fashion. Puncture was
performed at L2-3 level using a 3 1/2 inch 20 gauge spinal needle
via midline approach. Using a single pass through the dura, the
needle was placed within the thecal sac, with return of clear CSF.
16 cc of Isovue 200 was injected into the thecal sac, with normal
opacification of the nerve roots and cauda equina consistent with
free flow within the subarachnoid space.

I personally performed the lumbar puncture and administered the
intrathecal contrast. I also personally supervised acquisition of
the myelogram images.
FINDINGS: LUMBAR MYELOGRAM FINDINGS:

Curvature of the lumbar spines positional.

When the patient is in a prone position, there is significant
underfilling of the thecal sac at the L3-4, L4-5 and L5-S1 level.
This is less notable when the patient is placed in a lateral
position. This is suggestive of dynamic instability.

CT LUMBAR MYELOGRAM FINDINGS:

Transitional appearance of what has previously been labeled the S1
vertebral body placing a rudimentary disc at the S1-2 level.
Utilizing this level assignment, the present postmyelogram CT
incorporates from the T11-12 disc space through the lower sacrum.

Conus L1 level.  No abnormal surrounding vessels.

Calcified mildly ectatic aorta and iliac arteries. Tiny
nonobstructing right renal calculi.

T11-12: Small Schmorl's node deformity. No significant spinal
stenosis or foraminal narrowing.

T12-L1: Small Schmorl's node deformity. No significant spinal
stenosis or foraminal narrowing.

L1-2: Minimal to mild bulge laterally without compression of the
exiting L1 nerve roots. Mild facet degenerative changes. No
significant spinal stenosis.

L2-3: Bulge. Facet degenerative changes. Ligamentum flavum
hypertrophy. Multifactorial mild spinal stenosis and bilateral
foraminal narrowing.

L3-4: Disc desiccation with gas in the disc space and subchondral
position. Broad-based disc osteophyte complex. Facet degenerative
changes. Ligamentum flavum hypertrophy. Multifactorial moderate to
marked spinal stenosis, bilateral lateral recess stenosis and
bilateral foraminal narrowing.

L4-5: Disc degeneration with disc space narrowing. Small amount a
gas in the disc space. Broad-based disc osteophyte complex. Central
spur. Lateral osteophyte greater on the left. Facet degenerative
changes. Ligamentum flavum hypertrophy. Multifactorial moderate to
slightly marked spinal stenosis, marked bilateral lateral recess
stenosis and moderate to marked bilateral foraminal narrowing
greater on the left.

L5-S1: Disc degeneration with disc space narrowing. Small amount of
gas in the disc space. Broad-based disc osteophyte complex with
lateral extension greater to the left. Marked facet degenerative
changes and bony overgrowth. Ligamentum flavum hypertrophy.
Prominent dorsal epidural fat. Multifactorial moderate to marked
thecal sac narrowing, bilateral lateral recess stenosis and
bilateral foraminal narrowing.

S1-2: Transitional S1 vertebral body. No significant spinal
stenosis.

Bilateral sacroiliac joint degenerative changes.
IMPRESSION: LUMBAR MYELOGRAM:

Transitional vertebra labeled S1 as on prior plain films.

When the patient is in a prone position, there is significant
underfilling of the thecal sac at the L3-4, L4-5 and L5-S1 level.
This is less notable when the patient is placed in a lateral
position. Findings consistent with dynamic instability.

LUMBAR POSTMYELOGRAM CT:

Transitional appearance of what has previously been labeled the S1
vertebral body placing a rudimentary disc at the S1-2 level. This
level assignment will need to be understood by anyone involving the
patient's future care.

L3-4 multifactorial moderate to marked spinal stenosis, bilateral
lateral recess stenosis and bilateral foraminal narrowing.

L4-5 multifactorial moderate to slightly marked spinal stenosis,
marked bilateral lateral recess stenosis and moderate to marked
bilateral foraminal narrowing greater on the left.

L5-S1 multifactorial moderate to marked thecal sac narrowing,
bilateral lateral recess stenosis and bilateral foraminal narrowing.

L2-3 multifactorial mild spinal stenosis and bilateral foraminal
narrowing.

Please see above for further detail.

## 2017-10-20 NOTE — Telephone Encounter (Signed)
Error/MW °

## 2017-11-26 IMAGING — CR DG KNEE COMPLETE 4+V*R*
4 series · 5 of 5 positions shown · non-contrast
Comparison: None

CLINICAL DATA: Knee pain for 3 days

EXAM:
RIGHT KNEE - COMPLETE 4+ VIEW

[Series 1: knee ap · 0.14mm/px · 2 of 2 slices shown]
[im 1/2]
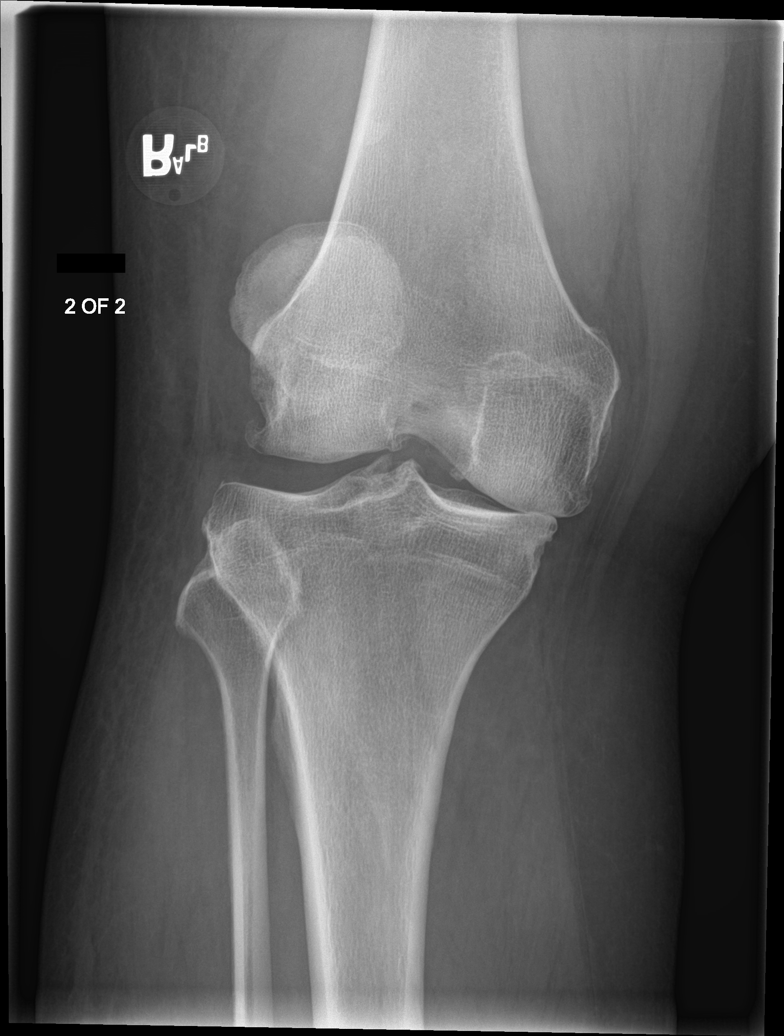
[im 2/2]
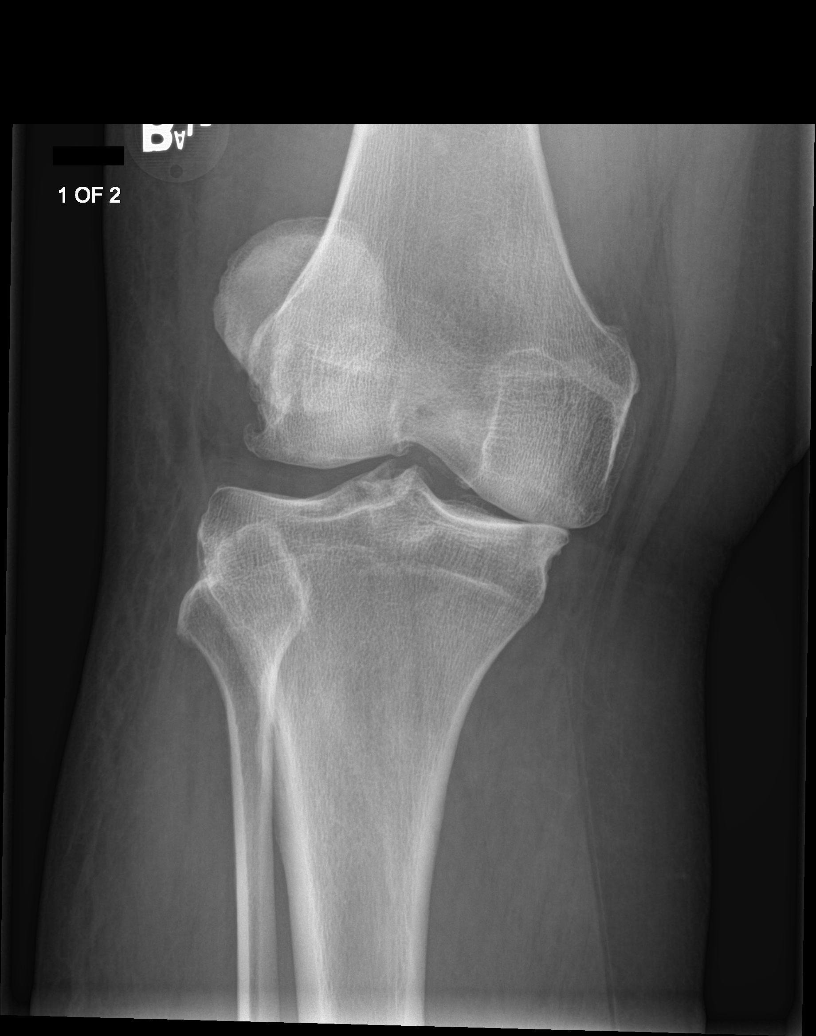

[knee lat]
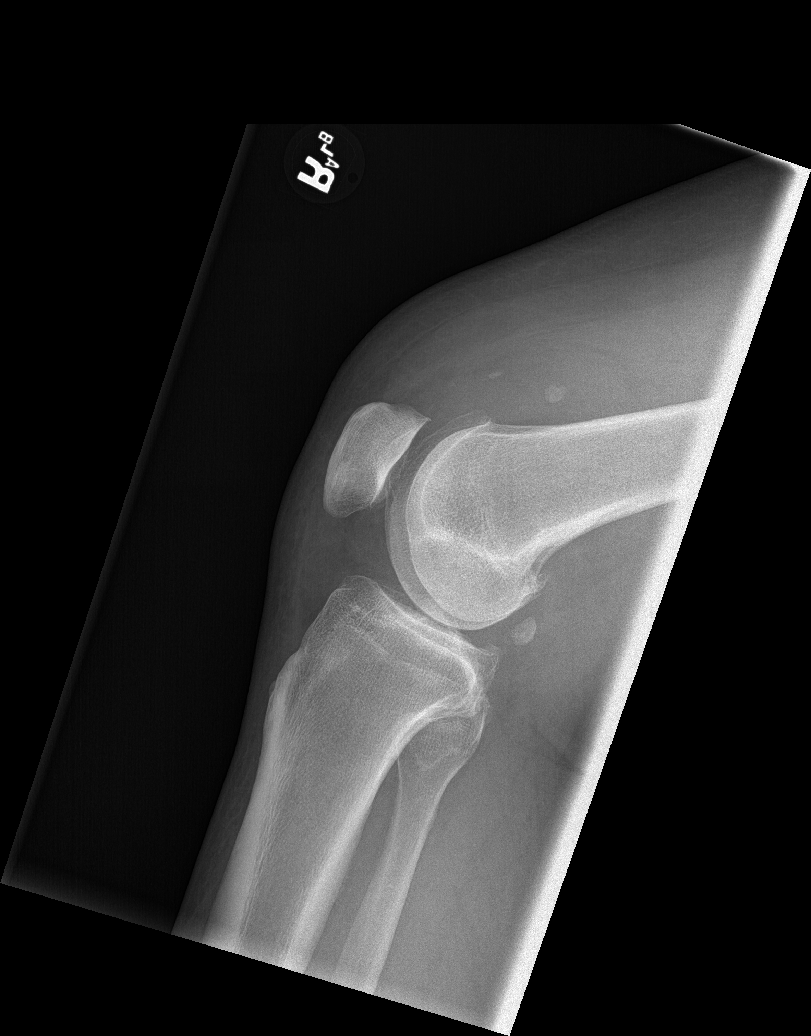

[tunnel]
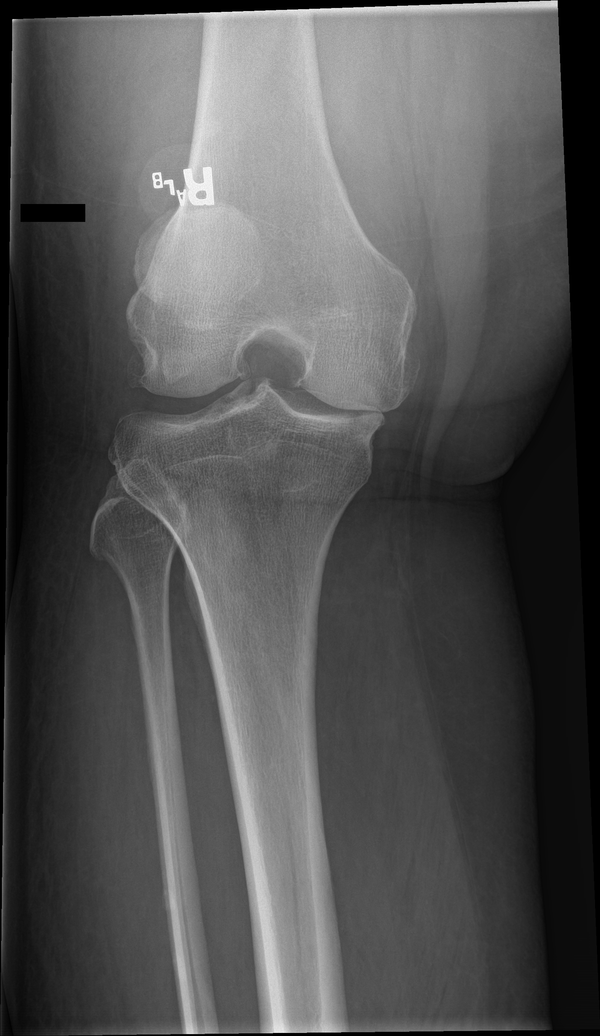

[patella skyline]
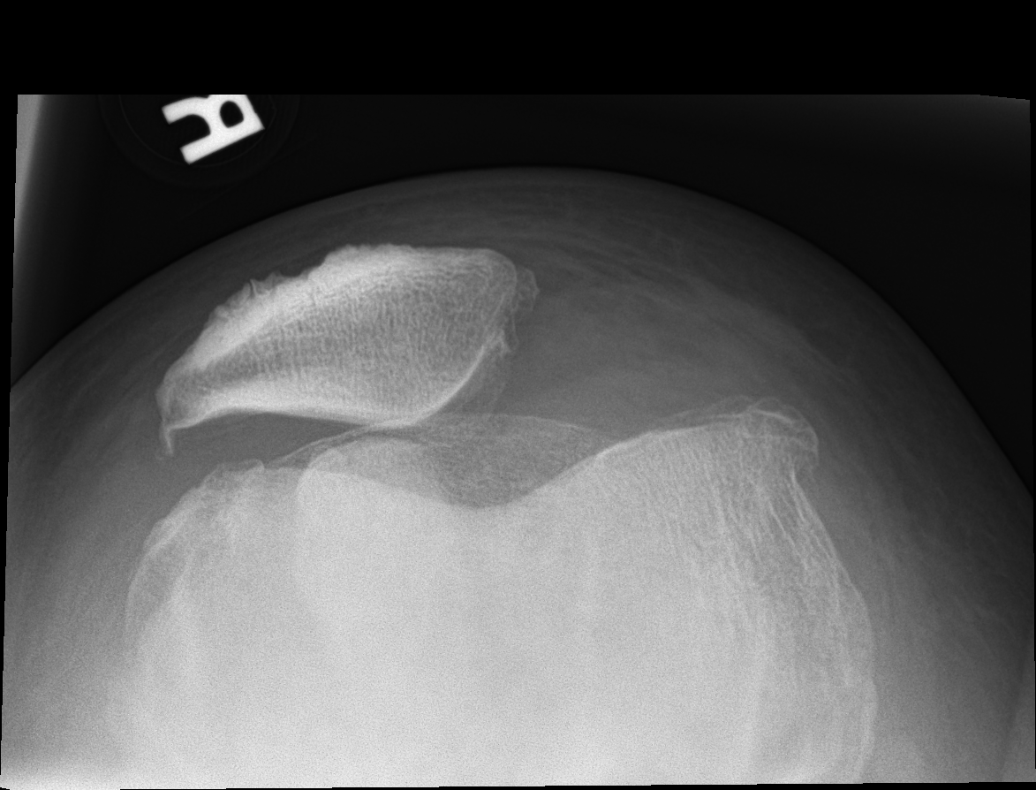

[5 of 5 positions shown; findings below may reference images not displayed]

FINDINGS: There is a moderate size suprapatellar joint effusion. Several loose
bodies are identified within the suprapatellar joint space.
Moderate-to-marked medial compartment osteoarthritis is noted. There
is mild patellofemoral and lateral compartment osteoarthritis.
IMPRESSION: 1. Osteoarthritis.
2. Suprapatellar joint effusion
3. Several loose bodies noted within the joint space.

## 2018-01-23 ENCOUNTER — Other Ambulatory Visit: Payer: Self-pay | Admitting: Family Medicine

## 2018-01-23 NOTE — Telephone Encounter (Signed)
Requesting 90 day supply.

## 2018-01-23 NOTE — Telephone Encounter (Signed)
[facility] pharmacy faxed a refill request for a 90-days supply for the following medication. Thanks CC  losartan-hydrochlorothiazide (HYZAAR) 100-25 MG tablet

## 2018-01-24 MED ORDER — LOSARTAN POTASSIUM-HCTZ 100-25 MG PO TABS: 1.0000 | ORAL_TABLET | Freq: Every day | ORAL | 0 refills | Status: DC

## 2018-03-04 ENCOUNTER — Other Ambulatory Visit: Payer: Self-pay | Admitting: Family Medicine

## 2018-04-07 ENCOUNTER — Other Ambulatory Visit: Payer: Self-pay | Admitting: Family Medicine

## 2018-04-11 ENCOUNTER — Other Ambulatory Visit: Payer: Self-pay | Admitting: Family Medicine

## 2018-05-08 ENCOUNTER — Other Ambulatory Visit: Payer: Self-pay | Admitting: Family Medicine

## 2018-05-08 NOTE — Telephone Encounter (Signed)
Patient has not been seen since march 2018, need to schedule o.v. Before I can approve refill

## 2018-05-08 NOTE — Telephone Encounter (Signed)
LMTCB 05/08/2018  Thanks,   -Abigaelle Verley  

## 2018-05-11 NOTE — Telephone Encounter (Signed)
lmtcb

## 2018-05-12 NOTE — Telephone Encounter (Signed)
NA

## 2018-06-21 ENCOUNTER — Inpatient Hospital Stay: Payer: 59 | Attending: Radiation Oncology

## 2018-06-21 ENCOUNTER — Other Ambulatory Visit: Payer: Self-pay | Admitting: *Deleted

## 2018-06-21 DIAGNOSIS — C61 Malignant neoplasm of prostate: Secondary | ICD-10-CM

## 2018-06-21 LAB — PSA: Prostatic Specific Antigen: 0.62 ng/mL (ref 0.00–4.00)

## 2018-06-22 ENCOUNTER — Ambulatory Visit (INDEPENDENT_AMBULATORY_CARE_PROVIDER_SITE_OTHER): Payer: 59 | Admitting: Family Medicine

## 2018-06-22 ENCOUNTER — Encounter: Payer: Self-pay | Admitting: Family Medicine

## 2018-06-22 VITALS — BP 138/84 | HR 78 | Temp 98.0°F | Resp 16 | Ht 73.0 in | Wt 380.0 lb

## 2018-06-22 DIAGNOSIS — Z0000 Encounter for general adult medical examination without abnormal findings: Secondary | ICD-10-CM

## 2018-06-22 DIAGNOSIS — I10 Essential (primary) hypertension: Secondary | ICD-10-CM

## 2018-06-22 DIAGNOSIS — C61 Malignant neoplasm of prostate: Secondary | ICD-10-CM

## 2018-06-22 DIAGNOSIS — Z950 Presence of cardiac pacemaker: Secondary | ICD-10-CM

## 2018-06-22 DIAGNOSIS — Z23 Encounter for immunization: Secondary | ICD-10-CM

## 2018-06-22 DIAGNOSIS — G7000 Myasthenia gravis without (acute) exacerbation: Secondary | ICD-10-CM | POA: Diagnosis not present

## 2018-06-22 DIAGNOSIS — E7800 Pure hypercholesterolemia, unspecified: Secondary | ICD-10-CM

## 2018-06-22 DIAGNOSIS — I442 Atrioventricular block, complete: Secondary | ICD-10-CM

## 2018-06-22 NOTE — Patient Instructions (Signed)
It is recommended to engage in 150 minutes of moderate exercise every week.   

## 2018-06-22 NOTE — Progress Notes (Signed)
Patient: Garrett Woods, Male    DOB: Apr 06, 1956, 62 y.o.   MRN: [id number] Visit Date: 06/22/2018  Today's Provider: Lelon Huh, MD   Chief Complaint  Patient presents with  . Annual Exam  . Hypertension  . Hyperlipidemia  . Hyperglycemia   Subjective:    Annual physical exam Garrett Woods is a 62 y.o. male who presents today for health maintenance and complete physical. He feels well. He reports exercising stationary bke. He reports he is sleeping fairly well.  ---------------------------------------------------------------  Hypertension, follow-up:  BP Readings from Last 3 Encounters:  06/22/18 138/84  06/08/17 (!) 146/102  02/07/17 120/80    He was last seen for hypertension 01/05/2017.  BP at that visit was 132/88. Management since that visit includes; no changes.He reports good compliance with treatment. He is not having side effects. none He is exercising. He is adherent to low salt diet.   Outside blood pressures are not checking. He is experiencing none.  Patient denies none.   Cardiovascular risk factors include advanced age (older than 47 for men, 85 for women).  Use of agents associated with hypertension: none.   ---------------------------------------------------------------   Lipid/Cholesterol, Follow-up:   Last seen for this 01/05/2017.  Management since that visit includes; labs checked, no changes.  Last Lipid Panel:    Component Value Date/Time   CHOL 199 01/05/2017 0908   TRIG 185 (H) 01/05/2017 0908   HDL 37 (L) 01/05/2017 0908   CHOLHDL 5.4 (H) 01/05/2017 0908   LDLCALC 125 (H) 01/05/2017 0908    He reports good compliance with treatment. He is not having side effects. None  Tried going up to 2 x 40mg  lovastatin but cause a lot of muscle aches.   Wt Readings from Last 3 Encounters:  06/22/18 (!) 380 lb (172.4 kg)  06/08/17 (!) 361 lb 1.8 oz (163.8 kg)  02/07/17 (!) 340 lb (154.2 kg)     ------------------------------------------------------------------------  Obesity, morbid (more than 100 lbs over ideal weight or BMI > 40) (HCC) From 01/05/2017-Discussed diet and exercise.  Myasthenia gravis ([place]) From 01/05/2017-no changes. Has a little bit of foot drop, but no other sx at this time.   Hyperglycemia From 01/05/2017-labs checked, no changes. Hemoglobin A1c 5.5.  Prostate cancer ([place]) From 01/05/2017-no changes. Continues regular follow up with Dr. Baruch Gouty.  Lab Results  Component Value Date   PSA 0.77 05/12/2016   PSA 0.97 11/12/2015   PSA 5.5 11/27/2014     Last gout flare was about 2 years ago.    Wt Readings from Last 3 Encounters:  06/22/18 (!) 380 lb (172.4 kg)  06/08/17 (!) 361 lb 1.8 oz (163.8 kg)  02/07/17 (!) 340 lb (154.2 kg)   Wt Readings from Last 5 Encounters:  06/22/18 (!) 380 lb (172.4 kg)  06/08/17 (!) 361 lb 1.8 oz (163.8 kg)  02/07/17 (!) 340 lb (154.2 kg)  01/05/17 (!) 349 lb (158.3 kg)  10/05/16 (!) 330 lb (149.7 kg)     Review of Systems  Constitutional: Negative for chills, diaphoresis and fever.  HENT: Negative for congestion, ear discharge, ear pain, hearing loss, nosebleeds, sore throat and tinnitus.   Eyes: Negative for photophobia, pain, discharge and redness.  Respiratory: Negative for cough, shortness of breath, wheezing and stridor.   Cardiovascular: Negative for chest pain, palpitations and leg swelling.  Gastrointestinal: Negative for abdominal pain, blood in stool, constipation, diarrhea, nausea and vomiting.  Endocrine: Negative for polydipsia.  Genitourinary: Negative for dysuria,  flank pain, frequency, hematuria and urgency.  Musculoskeletal: Negative for back pain, myalgias and neck pain.  Skin: Negative for rash.  Allergic/Immunologic: Negative for environmental allergies.  Neurological: Negative for dizziness, tremors, seizures, weakness and headaches.  Hematological: Does not bruise/bleed easily.   Psychiatric/Behavioral: Negative for hallucinations and suicidal ideas. The patient is not nervous/anxious.   All other systems reviewed and are negative.   Social History      He  reports that he quit smoking about 14 years ago. He smoked 0.00 packs per day for 20.00 years. He has quit using smokeless tobacco. He reports that he drinks alcohol. He reports that he does not use drugs.       Social History   Socioeconomic History  . Marital status: Married    Spouse name: Not on file  . Number of children: Not on file  . Years of education: Not on file  . Highest education level: Not on file  Occupational History  . Occupation: Empolyed    Comment: VP dinning services  Social Needs  . Financial resource strain: Not on file  . Food insecurity:    Worry: Not on file    Inability: Not on file  . Transportation needs:    Medical: Not on file    Non-medical: Not on file  Tobacco Use  . Smoking status: Former Smoker    Packs/day: 0.00    Years: 20.00    Pack years: 0.00    Last attempt to quit: 11/02/2003    Years since quitting: 14.6  . Smokeless tobacco: Former Network engineer and Sexual Activity  . Alcohol use: Yes    Alcohol/week: 0.0 standard drinks    Comment: 2 drinks every weekend  . Drug use: No  . Sexual activity: Not on file  Lifestyle  . Physical activity:    Days per week: Not on file    Minutes per session: Not on file  . Stress: Not on file  Relationships  . Social connections:    Talks on phone: Not on file    Gets together: Not on file    Attends religious service: Not on file    Active member of club or organization: Not on file    Attends meetings of clubs or organizations: Not on file    Relationship status: Not on file  Other Topics Concern  . Not on file  Social History Narrative  . Not on file    Past Medical History:  Diagnosis Date  . Arthritis   . Elevated PSA   . Foot drop, right    since spianl fusion 8/17  . Gout   . Heart murmur    . History of chicken pox   . Hyperlipemia   . Hypertension    Essential  . Microscopic hematuria   . MRSA (methicillin resistant staph aureus) culture positive    Nasal culture. Prior to Back surgery. Used "ointment"  . Myasthenia gravis ([place])   . Pacemaker    medtronic [id number] [place]  . Prostate cancer [facility])    Radiation     Patient Active Problem List   Diagnosis Date Noted  . Special screening for malignant neoplasms, colon   . Radiation proctitis   . Primary osteoarthritis of right knee 10/05/2016  . Lumbar stenosis with neurogenic claudication 06/02/2016  . Back ache 08/19/2015  . History of basal cell cancer 06/11/2015  . Prostate cancer ([place]) 06/11/2015  . Low back pain potentially associated with radiculopathy 06/11/2015  .  Artificial cardiac pacemaker 12/30/2014  . Bradycardia 11/21/2014  . Breathlessness on exertion 11/21/2014  . 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 Carpal tunnel syndrome 05/08/2009  . Lumbago 01/09/2009  . Hyperlipidemia 11/08/2006  . Obesity, morbid (more than 100 lbs over ideal weight or BMI > 40) ([place]) 07/23/2006  . Myasthenia gravis ([place]) 07/22/2003  . Essential (primary) hypertension 12/02/2000    Past Surgical History:  Procedure Laterality Date  . BACK SURGERY  06/2016   lumbar fusion  . BASAL CELL CARCINOMA EXCISION  12/28/2012   Anterior neck, [facility] Lacinda [facility]on [facility]  . CARPAL TUNNEL RELEASE Left   . COLONOSCOPY W/ POLYPECTOMY    . COLONOSCOPY WITH PROPOFOL N/A 02/07/2017   Procedure: COLONOSCOPY WITH PROPOFOL;  Surgeon: Lucilla Lame, MD;  Location: [place];  Service: Endoscopy;  Laterality: N/A;  . INSERT / REPLACE / REMOVE PACEMAKER  12/24/2014   Medtronic  . KNEE SURGERY Right 1988   Tear of LCL of knee  . MELANOMA EXCISION  2013,2015   X 2; Neck & Back  . TOTAL KNEE ARTHROPLASTY Right 10/05/2016   Procedure: TOTAL KNEE ARTHROPLASTY;  Surgeon: Hessie Knows, MD;  Location: [facility];  Service: Orthopedics;   Laterality: Right;  +MRSA PCR     Family History        Family Status  Relation Name Status  . Father  Deceased at age 49  . Mother  Deceased at age 72       died of old age  . Other pos fam fx: Alive  . Sister  Alive  . Sister  Alive  . Neg Hx  (Not Specified)        His family history includes Diabetes in his other; Heart Problems in his mother; Heart attack in his father; Heart disease in his other; Heart failure in his father; Hypertension in his mother; Hyperthyroidism in his other. There is no history of Cancer.      No Known Allergies   Current Outpatient Medications:  .  acetaminophen (TYLENOL) 500 MG tablet, Take 1,000 mg by mouth every 6 (six) hours as needed for mild pain., Disp: , Rfl:  .  ASPIRIN 81 PO, Take 1 tablet by mouth daily., Disp: , Rfl:  .  ibuprofen (ADVIL,MOTRIN) 200 MG tablet, Take 400 mg by mouth every 6 (six) hours as needed for mild pain., Disp: , Rfl:  .  losartan-hydrochlorothiazide (HYZAAR) 100-25 MG tablet, TAKE 1 TABLET BY MOUTH EVERY DAY, Disp: 14 tablet, Rfl: 0 .  lovastatin (MEVACOR) 40 MG tablet, TAKE 1 TABLET BY MOUTH EVERY DAY, Disp: 90 tablet, Rfl: 0 .  fluorouracil (EFUDEX) 5 % cream, Apply 1 application topically 2 (two) times daily. Apply for 2 weeks  Patient has about a week left to take, Disp: , Rfl:  .  Glucosamine-Chondroitin (OSTEO BI-FLEX REGULAR STRENGTH PO), Take by mouth., Disp: , Rfl:    Patient Care Team: Birdie Sons, MD as PCP - General (Family Medicine) Christene Slates, MD (Dermatology) Alexis Goodell, MD as Consulting Physician (Neurology) Noreene Filbert, MD as Referring Physician (Radiation Oncology) Collier Flowers, MD as Referring Physician (Urology) Corey Skains, MD as Consulting Physician (Cardiology)      Objective:   Vitals: BP 138/84 (BP Location: Right Arm, Cuff Size: Large)   Pulse 78   Temp 98 F (36.7 C) (Oral)   Resp 16   Ht 6\' 1"  (1.854 m)   Wt (!) 380 lb (172.4 kg)  SpO2 98%   BMI  50.13 kg/m    Vitals:   06/22/18 1014 06/22/18 1021  BP: (!) 151/90 138/84  Pulse: 78   Resp: 16   Temp: 98 F (36.7 C)   TempSrc: Oral   SpO2: 98%   Weight: (!) 380 lb (172.4 kg)   Height: 6\' 1"  (1.854 m)      Physical Exam   General Appearance:    Alert, cooperative, no distress, appears stated age, morbidly obese  Head:    Normocephalic, without obvious abnormality, atraumatic  Eyes:    PERRL, conjunctiva/corneas clear, EOM's intact, fundi    benign, both eyes       Ears:    Normal TM's and external ear canals, both ears  Nose:   Nares normal, septum midline, mucosa normal, no drainage   or sinus tenderness  Throat:   Lips, mucosa, and tongue normal; teeth and gums normal  Neck:   Supple, symmetrical, trachea midline, no adenopathy;       thyroid:  No enlargement/tenderness/nodules; no carotid   bruit or JVD  Back:     Symmetric, no curvature, ROM normal, no CVA tenderness  Lungs:     Clear to auscultation bilaterally, respirations unlabored  Chest wall:    No tenderness or deformity  Heart:    Regular rate and rhythm, S1 and S2 normal, no murmur, rub   or gallop  Abdomen:     Soft, non-tender, bowel sounds active all four quadrants,    no masses, no organomegaly  Genitalia:    deferred  Rectal:    deferred  Extremities:   Extremities normal, atraumatic, no cyanosis or edema  Pulses:   2+ and symmetric all extremities  Skin:   Skin color, texture, turgor normal, no rashes or lesions  Lymph nodes:   Cervical, supraclavicular, and axillary nodes normal  Neurologic:   CNII-XII intact. Normal strength, sensation and reflexes      throughout    Depression Screen PHQ 2/9 Scores 06/22/2018 06/08/2017 05/12/2016 11/12/2015  PHQ - 2 Score 0 0 0 0  PHQ- 9 Score 0 - - -   Epworth=3  Assessment & Plan:     Routine Health Maintenance and Physical Exam  Exercise Activities and Dietary recommendations Goals   None     Immunization History  Administered Date(s)  Administered  . Influenza,inj,Quad PF,6+ Mos 10/08/2016  . Tdap 03/20/2013    Health Maintenance  Topic Date Due  . Hepatitis C Screening  22-Jul-1956  . HIV Screening  05/01/1969  . INFLUENZA VACCINE  06/01/2018  . TETANUS/TDAP  03/21/2023  . COLONOSCOPY  02/08/2027     Discussed health benefits of physical activity, and encouraged him to engage in regular exercise appropriate for his age and condition.    --------------------------------------------------------------------  1. Annual physical exam   2. Obesity, morbid (more than 100 lbs over ideal weight or BMI > 40) (HCC) Encouraged regular exercise and reducing portion sizes.  - Hemoglobin A1c  3. Prostate cancer [facility]) Continue regular follow up Dr. Donella Stade.   4. Myasthenia gravis ([place]) Stable   5. Acquired complete AV block [facility]) S/p pacemaker placement followed by Dr. Nehemiah Massed.   6. Status post placement of cardiac pacemaker   7. Pure hypercholesterolemia He is tolerating lovastatin well with no adverse effects.  Did not tolerate 80mg  daily - CBC - Comprehensive metabolic panel - Lipid panel - Hemoglobin A1c - TSH  8. Essential (primary) hypertension Well controlled.  Continue current medications.   -  Hemoglobin A1c - TSH  9. Need for shingles vaccine  - Varicella-zoster vaccine IM Repeat after 2 months.    Lelon Huh, MD  [facility]

## 2018-06-23 LAB — COMPREHENSIVE METABOLIC PANEL
ALBUMIN: 4.4 g/dL (ref 3.6–4.8)
ALT: 31 IU/L (ref 0–44)
AST: 29 IU/L (ref 0–40)
Albumin/Globulin Ratio: 1.6 (ref 1.2–2.2)
Alkaline Phosphatase: 64 IU/L (ref 39–117)
BUN/Creatinine Ratio: 20 (ref 10–24)
BUN: 22 mg/dL (ref 8–27)
Bilirubin Total: 0.6 mg/dL (ref 0.0–1.2)
CALCIUM: 9.5 mg/dL (ref 8.6–10.2)
CO2: 23 mmol/L (ref 20–29)
CREATININE: 1.09 mg/dL (ref 0.76–1.27)
Chloride: 103 mmol/L (ref 96–106)
GFR calc Af Amer: 82 mL/min/{1.73_m2} (ref >59)
GFR, EST NON AFRICAN AMERICAN: 71 mL/min/{1.73_m2} (ref >59)
GLUCOSE: 100 mg/dL — AB (ref 65–99)
Globulin, Total: 2.7 g/dL (ref 1.5–4.5)
Potassium: 4.3 mmol/L (ref 3.5–5.2)
Sodium: 141 mmol/L (ref 134–144)
TOTAL PROTEIN: 7.1 g/dL (ref 6.0–8.5)

## 2018-06-23 LAB — HEMOGLOBIN A1C
Est. average glucose Bld gHb Est-mCnc: 123 mg/dL
Hgb A1c MFr Bld: 5.9 % — ABNORMAL HIGH (ref 4.8–5.6)

## 2018-06-23 LAB — CBC
HEMATOCRIT: 41.6 % (ref 37.5–51.0)
HEMOGLOBIN: 14.1 g/dL (ref 13.0–17.7)
MCH: 32.6 pg (ref 26.6–33.0)
MCHC: 33.9 g/dL (ref 31.5–35.7)
MCV: 96 fL (ref 79–97)
Platelets: 197 10*3/uL (ref 150–450)
RBC: 4.32 x10E6/uL (ref 4.14–5.80)
RDW: 14 % (ref 12.3–15.4)
WBC: 6.5 10*3/uL (ref 3.4–10.8)

## 2018-06-23 LAB — LIPID PANEL
CHOL/HDL RATIO: 5.1 ratio — AB (ref 0.0–5.0)
CHOLESTEROL TOTAL: 163 mg/dL (ref 100–199)
HDL: 32 mg/dL — ABNORMAL LOW (ref >39)
LDL CALC: 103 mg/dL — AB (ref 0–99)
TRIGLYCERIDES: 142 mg/dL (ref 0–149)
VLDL CHOLESTEROL CAL: 28 mg/dL (ref 5–40)

## 2018-06-23 LAB — TSH: TSH: 2.68 u[IU]/mL (ref 0.450–4.500)

## 2018-06-28 ENCOUNTER — Encounter: Payer: Self-pay | Admitting: Radiation Oncology

## 2018-06-28 ENCOUNTER — Other Ambulatory Visit: Payer: Self-pay

## 2018-06-28 ENCOUNTER — Ambulatory Visit
Admission: RE | Admit: 2018-06-28 | Discharge: 2018-06-28 | Disposition: A | Discharge status: Home / Self Care | Payer: 59 | Source: Ambulatory Visit | Attending: Radiation Oncology | Admitting: Radiation Oncology

## 2018-06-28 ENCOUNTER — Other Ambulatory Visit: Payer: Self-pay | Admitting: *Deleted

## 2018-06-28 DIAGNOSIS — Z923 Personal history of irradiation: Secondary | ICD-10-CM | POA: Insufficient documentation

## 2018-06-28 DIAGNOSIS — C61 Malignant neoplasm of prostate: Secondary | ICD-10-CM | POA: Insufficient documentation

## 2018-06-28 NOTE — Progress Notes (Signed)
Radiation Oncology Follow up Note  Name: Garrett Woods   Date:   06/28/2018 MRN:  [id number] DOB: 07-08-1956    This 62 y.o. male presents to the clinic today for 3 year follow-upstatus post IM RT radiation therapy to his prostate for Gleason 6 adenocarcinoma.  REFERRING PROVIDER: Birdie Sons, MD  HPI: patient is a 62 year old male now seen out 3 years having completed IM RT radiation therapy to his prostate for Gleason 6 (3+3) adenocarcinoma. He is seen today in routine follow-up and is doing well. He specifically denies any increased lower lower urinary tract symptoms diarrhea or fatigue. His most recent PSA was 0.6 stable from 1 year prior..  COMPLICATIONS OF TREATMENT: none  FOLLOW UP COMPLIANCE: keeps appointments   PHYSICAL EXAM:  Temp (P) 97.7 F (36.5 C) (Tympanic)   Resp (P) 16   Wt (!) (P) 380 lb 4.7 oz (172.5 kg)   BMI (P) 50.17 kg/m  Well-developed well-nourished patient in NAD. HEENT reveals PERLA, EOMI, discs not visualized.  Oral cavity is clear. No oral mucosal lesions are identified. Neck is clear without evidence of cervical or supraclavicular adenopathy. Lungs are clear to A&P. Cardiac examination is essentially unremarkable with regular rate and rhythm without murmur rub or thrill. Abdomen is benign with no organomegaly or masses noted. Motor sensory and DTR levels are equal and symmetric in the upper and lower extremities. Cranial nerves II through XII are grossly intact. Proprioception is intact. No peripheral adenopathy or edema is identified. No motor or sensory levels are noted. Crude visual fields are within normal range.  RADIOLOGY RESULTS: no current films for review  PLAN: present time he is under excellent biochemical control prostate cancer. I am please was overall progress. I've asked to see him back in 1 year for follow-up. Patient is to call sooner with any concerns.  I would like to take this opportunity to thank you for allowing me to  participate in the care of your patient.Noreene Filbert, MD

## 2018-07-18 ENCOUNTER — Other Ambulatory Visit: Payer: Self-pay | Admitting: Family Medicine

## 2018-08-22 ENCOUNTER — Ambulatory Visit (INDEPENDENT_AMBULATORY_CARE_PROVIDER_SITE_OTHER): Payer: 59 | Admitting: Family Medicine

## 2018-08-22 DIAGNOSIS — Z23 Encounter for immunization: Secondary | ICD-10-CM | POA: Diagnosis not present

## 2018-08-22 NOTE — Progress Notes (Signed)
Nurse visit only. Shingles vaccine #2 administered.

## 2018-10-09 ENCOUNTER — Encounter: Payer: Self-pay | Admitting: Family Medicine

## 2018-10-09 ENCOUNTER — Ambulatory Visit (INDEPENDENT_AMBULATORY_CARE_PROVIDER_SITE_OTHER): Payer: 59 | Admitting: Family Medicine

## 2018-10-09 VITALS — BP 130/80 | HR 51 | Temp 98.5°F | Resp 16 | Wt 383.0 lb

## 2018-10-09 DIAGNOSIS — J0140 Acute pansinusitis, unspecified: Secondary | ICD-10-CM

## 2018-10-09 MED ORDER — AMOXICILLIN 875 MG PO TABS: 875.0000 mg | ORAL_TABLET | Freq: Two times a day (BID) | ORAL | 0 refills | Status: DC

## 2018-10-09 MED ORDER — DM-GUAIFENESIN ER 30-600 MG PO TB12: 1.0000 | ORAL_TABLET | Freq: Two times a day (BID) | ORAL | 0 refills | Status: DC

## 2018-10-09 NOTE — Progress Notes (Signed)
Patient: Garrett Woods Male    DOB: 19-Jul-1956   62 y.o.   MRN: [id number] Visit Date: 10/09/2018  Today's Provider: Vernie Murders, PA   Chief Complaint  Patient presents with  . Cough   Subjective:    Cough  This is a new problem. The current episode started 1 to 4 weeks ago (2 weeks). The problem has been waxing and waning. The problem occurs every few minutes. The cough is productive of sputum. Associated symptoms include headaches, nasal congestion and sweats. Pertinent negatives include no chest pain, chills, ear congestion, ear pain, fever, heartburn, hemoptysis, myalgias, postnasal drip, rash, rhinorrhea, sore throat, shortness of breath, weight loss or wheezing. Nothing aggravates the symptoms. Treatments tried: Coricidin  The treatment provided mild relief. There is no history of bronchitis or pneumonia.    Patient has had nasal congestion for 2 weeks. Patient states he thought symptoms had cleared up but he is having nasal congestion again. Patient states he also has productive cough. Other symptoms include: headaches, congestion, and. chest tightness. Patient states he has been taking otc Coricidin with mild relief.    Past Medical History:  Diagnosis Date  . Arthritis   . Elevated PSA   . Foot drop, right    since spianl fusion 8/17  . Gout   . Heart murmur   . History of chicken pox   . Hyperlipemia   . Hypertension    Essential  . Microscopic hematuria   . MRSA (methicillin resistant staph aureus) culture positive    Nasal culture. Prior to Back surgery. Used "ointment"  . Myasthenia gravis ([place])   . Pacemaker    medtronic [id number] [place]  . Prostate cancer [facility])    Radiation   Past Surgical History:  Procedure Laterality Date  . BACK SURGERY  06/2016   lumbar fusion  . BASAL CELL CARCINOMA EXCISION  12/28/2012   Anterior neck, [facility] by Dr. Lacinda Axon at [facility]  . CARPAL TUNNEL RELEASE Left   . COLONOSCOPY W/ POLYPECTOMY    . COLONOSCOPY WITH PROPOFOL  N/A 02/07/2017   Procedure: COLONOSCOPY WITH PROPOFOL;  Surgeon: Lucilla Lame, MD;  Location: [place];  Service: Endoscopy;  Laterality: N/A;  . INSERT / REPLACE / REMOVE PACEMAKER  12/24/2014   Medtronic  . KNEE SURGERY Right 1988   Tear of LCL of knee  . MELANOMA EXCISION  2013,2015   X 2; Neck & Back  . TOTAL KNEE ARTHROPLASTY Right 10/05/2016   Procedure: TOTAL KNEE ARTHROPLASTY;  Surgeon: Hessie Knows, MD;  Location: [facility] OR[facility];  Service: Orthopedics;  Laterality: Right;  +MRSA PCR    Family History  Problem Relation Age of Onset  . Heart failure Father   . Heart attack Father   . Hypertension Mother   . Heart Problems Mother   . Heart disease Other   . Hyperthyroidism Other   . Diabetes Other   . Cancer Neg Hx    No Known Allergies  Current Outpatient Medications:  .  acetaminophen (TYLENOL) 500 MG tablet, Take 1,000 mg by mouth every 6 (six) hours as needed for mild pain., Disp: , Rfl:  .  ASPIRIN 81 PO, Take 1 tablet by mouth daily., Disp: , Rfl:  .  fluorouracil (EFUDEX) 5 % cream, Apply 1 application topically 2 (two) times daily. Apply for 2 weeks  Patient has about a week left to take, Disp: , Rfl:  .  ibuprofen (ADVIL,MOTRIN) 200 MG tablet, Take 400 mg  by mouth every 6 (six) hours as needed for mild pain., Disp: , Rfl:  .  losartan-hydrochlorothiazide (HYZAAR) 100-25 MG tablet, TAKE 1 TABLET BY MOUTH EVERY DAY, Disp: 14 tablet, Rfl: 0 .  lovastatin (MEVACOR) 40 MG tablet, TAKE 1 TABLET BY MOUTH EVERY DAY, Disp: 90 tablet, Rfl: 4 .  Glucosamine-Chondroitin (OSTEO BI-FLEX REGULAR STRENGTH PO), Take by mouth., Disp: , Rfl:   Review of Systems  Constitutional: Negative for appetite change, chills, fever and weight loss.  HENT: Positive for congestion and sinus pressure. Negative for ear pain, postnasal drip, rhinorrhea, sinus pain and sore throat.   Respiratory: Positive for cough and chest tightness. Negative for hemoptysis, shortness of breath and wheezing.     Cardiovascular: Negative for chest pain and palpitations.  Gastrointestinal: Negative for abdominal pain, heartburn, nausea and vomiting.  Musculoskeletal: Negative for myalgias.  Skin: Negative for rash.  Neurological: Positive for headaches.   Social History   Tobacco Use  . Smoking status: Former Smoker    Packs/day: 0.00    Years: 20.00    Pack years: 0.00    Last attempt to quit: 11/02/2003    Years since quitting: 14.9  . Smokeless tobacco: Former Network engineer Use Topics  . Alcohol use: Yes    Alcohol/week: 0.0 standard drinks    Comment: 2 drinks every weekend   Objective:   BP 130/80 (BP Location: Right Arm, Patient Position: Sitting, Cuff Size: Large)   Pulse (!) 51   Temp 98.5 F (36.9 C) (Oral)   Resp 16   Wt (!) 383 lb (173.7 kg)   SpO2 98%   BMI 50.53 kg/m  Vitals:   10/09/18 1506  BP: 130/80  Pulse: (!) 51  Resp: 16  Temp: 98.5 F (36.9 C)  TempSrc: Oral  SpO2: 98%  Weight: (!) 383 lb (173.7 kg)   Physical Exam  Constitutional: He is oriented to person, place, and time. He appears well-developed and well-nourished. No distress.  HENT:  Head: Normocephalic and atraumatic.  Right Ear: Hearing and external ear normal.  Left Ear: Hearing and external ear normal.  Nose: Nose normal.  Poor transillumination of sinuses. No tenderness. Slight cobblestoning of posterior pharynx and soft palate.  Eyes: Conjunctivae and lids are normal. Right eye exhibits no discharge. Left eye exhibits no discharge. No scleral icterus.  Neck: Neck supple.  Cardiovascular: Normal rate and regular rhythm.  Pulmonary/Chest: Effort normal and breath sounds normal. No respiratory distress.  Abdominal: Soft. Bowel sounds are normal.  Musculoskeletal: Normal range of motion.  Neurological: He is alert and oriented to person, place, and time.  Skin: Skin is intact. No lesion and no rash noted.  Psychiatric: He has a normal mood and affect. His speech is normal and behavior is  normal. Thought content normal.      Assessment & Plan:     1. Subacute pansinusitis Recurrence of PND and cough with sinus congestion and scratchy throat over the past few days. Had an URI 2 weeks ago and seemed to nearly be gone when this episode started up. Will treat with Amoxil, Mucinex-DM and Flonase (OTC). Increase fluid intake and may use Tylenol prn. Recheck prn. - amoxicillin (AMOXIL) 875 MG tablet; Take 1 tablet (875 mg total) by mouth 2 (two) times daily.  Dispense: 20 tablet; Refill: 0 - dextromethorphan-guaiFENesin (MUCINEX DM) 30-600 MG 12hr tablet; Take 1 tablet by mouth 2 (two) times daily.  Dispense: 20 tablet; Refill: 0        Simona Huh  [place], [facility]

## 2018-11-22 ENCOUNTER — Other Ambulatory Visit: Payer: Self-pay | Admitting: Family Medicine

## 2019-07-05 ENCOUNTER — Other Ambulatory Visit: Payer: 59

## 2019-07-10 DIAGNOSIS — I442 Atrioventricular block, complete: Secondary | ICD-10-CM | POA: Diagnosis not present

## 2019-07-12 ENCOUNTER — Ambulatory Visit: Payer: 59 | Admitting: Radiation Oncology

## 2019-07-23 DIAGNOSIS — L821 Other seborrheic keratosis: Secondary | ICD-10-CM | POA: Diagnosis not present

## 2019-07-23 DIAGNOSIS — Z85828 Personal history of other malignant neoplasm of skin: Secondary | ICD-10-CM | POA: Diagnosis not present

## 2019-07-23 DIAGNOSIS — L570 Actinic keratosis: Secondary | ICD-10-CM | POA: Diagnosis not present

## 2019-07-23 DIAGNOSIS — Z85820 Personal history of malignant melanoma of skin: Secondary | ICD-10-CM | POA: Diagnosis not present

## 2019-07-23 DIAGNOSIS — L920 Granuloma annulare: Secondary | ICD-10-CM | POA: Diagnosis not present

## 2019-07-24 ENCOUNTER — Other Ambulatory Visit: Payer: Self-pay | Admitting: Family Medicine

## 2019-07-26 DIAGNOSIS — I442 Atrioventricular block, complete: Secondary | ICD-10-CM | POA: Diagnosis not present

## 2019-09-06 ENCOUNTER — Telehealth (INDEPENDENT_AMBULATORY_CARE_PROVIDER_SITE_OTHER): Payer: Medicare Other | Admitting: Family Medicine

## 2019-09-06 ENCOUNTER — Other Ambulatory Visit: Payer: Self-pay

## 2019-09-06 DIAGNOSIS — J069 Acute upper respiratory infection, unspecified: Secondary | ICD-10-CM | POA: Diagnosis not present

## 2019-09-06 DIAGNOSIS — Z20822 Contact with and (suspected) exposure to covid-19: Secondary | ICD-10-CM

## 2019-09-06 NOTE — Progress Notes (Signed)
Patient: Garrett Woods Male    DOB: [birth year]   63 y.o.   MRN: [id number] Visit Date: 09/06/2019  Today's Provider: Lavon Paganini, MD   Chief Complaint  Patient presents with  . URI    Started 09/04/2019   Subjective:      Virtual Visit via Video Note  I connected with [name] on 09/06/19 at 11:00 AM EST by a video enabled telemedicine application and verified that I am speaking with the correct person using two identifiers.   Patient location: home Provider location: [place] involved in the visit: patient, provider   I discussed the limitations of evaluation and management by telemedicine and the availability of in person appointments. The patient expressed understanding and agreed to proceed.   Interactive audio and video communications were attempted, although failed due to patient's inability to connect to video. Continued visit with audio only interaction with patient agreement.    URI  This is a new problem. The current episode started in the past 7 days. The problem has been gradually worsening. There has been no fever. Associated symptoms include congestion, coughing, rhinorrhea, sinus pain and a sore throat. Pertinent negatives include no ear pain, headaches or wheezing.   Rhinorrhea started 2 days ago. Sore throat yesterday morning.  Nasal congestion present as well.  Checking temp and no fever (T98.5) this AM.  No headache.  Feels malaise and fatigue.  No loss of taste/smell. +Cough (thinks this is from the drainage).  Denies SOB  No known sick contacts.  States that he has been trying to be very safe in pandemic.  Did visit daughter last weekend in [place], however.   No Known Allergies   Current Outpatient Medications:  .  acetaminophen (TYLENOL) 500 MG tablet, Take 1,000 mg by mouth every 6 (six) hours as needed for mild pain., Disp: , Rfl:  .  apixaban (ELIQUIS) 5 MG TABS tablet, Take by mouth., Disp: , Rfl:   .  ibuprofen (ADVIL,MOTRIN) 200 MG tablet, Take 400 mg by mouth every 6 (six) hours as needed for mild pain., Disp: , Rfl:  .  losartan-hydrochlorothiazide (HYZAAR) 100-25 MG tablet, TAKE 1 TABLET BY MOUTH EVERY DAY, Disp: 90 tablet, Rfl: 4 .  lovastatin (MEVACOR) 40 MG tablet, TAKE 1 TABLET BY MOUTH EVERY DAY, Disp: 90 tablet, Rfl: 1 .  amoxicillin (AMOXIL) 875 MG tablet, Take 1 tablet (875 mg total) by mouth 2 (two) times daily., Disp: 20 tablet, Rfl: 0 .  ASPIRIN 81 PO, Take 1 tablet by mouth daily., Disp: , Rfl:  .  dextromethorphan-guaiFENesin (MUCINEX DM) 30-600 MG 12hr tablet, Take 1 tablet by mouth 2 (two) times daily. (Patient not taking: Reported on 09/06/2019), Disp: 20 tablet, Rfl: 0 .  fluorouracil (EFUDEX) 5 % cream, Apply 1 application topically 2 (two) times daily. Apply for 2 weeks  Patient has about a week left to take, Disp: , Rfl:  .  Glucosamine-Chondroitin (OSTEO BI-FLEX REGULAR STRENGTH PO), Take by mouth., Disp: , Rfl:   Review of Systems  Constitutional: Positive for fatigue. Negative for activity change, appetite change, chills, diaphoresis, fever and unexpected weight change.  HENT: Positive for congestion, postnasal drip, rhinorrhea, sinus pressure, sinus pain, sore throat, trouble swallowing and voice change. Negative for ear discharge and ear pain.   Eyes: Positive for discharge. Negative for photophobia, pain, redness, itching and visual disturbance.  Respiratory: Positive for cough. Negative for apnea, choking, chest tightness, shortness of breath, wheezing and stridor.  Gastrointestinal: Negative.   Musculoskeletal: Positive for myalgias.  Neurological: Negative for dizziness, numbness and headaches.    Social History   Tobacco Use  . Smoking status: Former Smoker    Packs/day: 0.00    Years: 20.00    Pack years: 0.00    Quit date: 11/02/2003    Years since quitting: 15.8  . Smokeless tobacco: Former Network engineer Use Topics  . Alcohol use: Yes     Alcohol/week: 0.0 standard drinks    Comment: 2 drinks every weekend      Objective:   There were no vitals taken for this visit. There were no vitals filed for this visit.There is no height or weight on file to calculate BMI.   Physical Exam Speaking in full sentences in no apparent distress  No results found for any visits on 09/06/19.     Assessment & Plan    Follow Up Instructions:    I discussed the assessment and treatment plan with the patient. The patient was provided an opportunity to ask questions and all were answered. The patient agreed with the plan and demonstrated an understanding of the instructions.   The patient was advised to call back or seek an in-person evaluation if the symptoms worsen or if the condition fails to improve as anticipated.  1. Upper respiratory tract infection, unspecified type - symptoms c/w viral URI  - no evidence of strep pharyngitis, CAP, AOM, bacterial sinusitis, or other bacterial infection -Discussed concern with patient for possible COVID-19 infection in the setting of the current pandemic -Discussed self-isolation for 10 days from the start of illness until fever free for at least 2 days -Patient is high risk given his heart disease, myasthenia gravis, obesity, and age -We will send for outpatient testing today - discussed symptomatic management, natural course, and return/ER precautions     The entirety of the information documented in the History of Present Illness, Review of Systems and Physical Exam were personally obtained by me. Portions of this information were initially documented by Lynford Humphrey, CMA and reviewed by me for thoroughness and accuracy.    Bacigalupo, Dionne Bucy, MD MPH [facility]

## 2019-09-07 LAB — NOVEL CORONAVIRUS, NAA: SARS-CoV-2, NAA: NOT DETECTED

## 2019-10-15 ENCOUNTER — Encounter: Payer: Self-pay | Admitting: Family Medicine

## 2019-10-15 ENCOUNTER — Ambulatory Visit (INDEPENDENT_AMBULATORY_CARE_PROVIDER_SITE_OTHER): Payer: Medicare Other | Admitting: Family Medicine

## 2019-10-15 ENCOUNTER — Other Ambulatory Visit: Payer: Self-pay

## 2019-10-15 VITALS — BP 124/79 | HR 65 | Temp 96.2°F | Resp 16 | Wt 392.4 lb

## 2019-10-15 DIAGNOSIS — Z23 Encounter for immunization: Secondary | ICD-10-CM | POA: Diagnosis not present

## 2019-10-15 DIAGNOSIS — I10 Essential (primary) hypertension: Secondary | ICD-10-CM

## 2019-10-15 DIAGNOSIS — R739 Hyperglycemia, unspecified: Secondary | ICD-10-CM

## 2019-10-15 DIAGNOSIS — R3911 Hesitancy of micturition: Secondary | ICD-10-CM

## 2019-10-15 DIAGNOSIS — C61 Malignant neoplasm of prostate: Secondary | ICD-10-CM

## 2019-10-15 DIAGNOSIS — R350 Frequency of micturition: Secondary | ICD-10-CM

## 2019-10-15 DIAGNOSIS — M19041 Primary osteoarthritis, right hand: Secondary | ICD-10-CM

## 2019-10-15 DIAGNOSIS — E7800 Pure hypercholesterolemia, unspecified: Secondary | ICD-10-CM

## 2019-10-15 LAB — POCT URINALYSIS DIPSTICK
Bilirubin, UA: NEGATIVE
Glucose, UA: NEGATIVE
Ketones, UA: NEGATIVE
Leukocytes, UA: NEGATIVE
Nitrite, UA: NEGATIVE
Protein, UA: POSITIVE — AB
Spec Grav, UA: 1.015 (ref 1.010–1.025)
Urobilinogen, UA: 0.2 E.U./dL
pH, UA: 5 (ref 5.0–8.0)

## 2019-10-15 NOTE — Progress Notes (Signed)
Patient: Garrett Woods Male    DOB: [birth year]   63 y.o.   MRN: [id number] Visit Date: 10/15/2019  Today's Provider: Lelon Huh, MD   Chief Complaint  Patient presents with  . Urinary Frequency  . Hand Pain   Subjective:     Urinary Frequency  This is a recurrent problem. The current episode started more than 1 month ago (patient reports this only occurs in the PM). The problem occurs intermittently. The problem has been gradually worsening. There has been no fever. Associated symptoms include frequency. Pertinent negatives include no chills, discharge, flank pain, hematuria, hesitancy, nausea, possible pregnancy, sweats, urgency or vomiting. He has tried nothing for the symptoms.  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 The pain is present in the right hand. The quality of the pain is described as stabbing and aching. The pain does not radiate. The pain has been intermittent since the incident. Associated symptoms include numbness and tingling. Pertinent negatives include no chest pain or muscle weakness. The symptoms are aggravated by movement. He has tried nothing for the symptoms. The treatment provided no relief.    No Known Allergies   Current Outpatient Medications:  .  acetaminophen (TYLENOL) 500 MG tablet, Take 1,000 mg by mouth every 6 (six) hours as needed for mild pain., Disp: , Rfl:  .  apixaban (ELIQUIS) 5 MG TABS tablet, Take by mouth., Disp: , Rfl:  .  losartan-hydrochlorothiazide (HYZAAR) 100-25 MG tablet, TAKE 1 TABLET BY MOUTH EVERY DAY, Disp: 90 tablet, Rfl: 4 .  lovastatin (MEVACOR) 40 MG tablet, TAKE 1 TABLET BY MOUTH EVERY DAY, Disp: 90 tablet, Rfl: 1 .  ibuprofen (ADVIL,MOTRIN) 200 MG tablet, Take 400 mg by mouth every 6 (six) hours as needed for mild pain., Disp: , Rfl:   Review of Systems  Constitutional: Negative for chills.  Cardiovascular: Negative for chest pain.    Gastrointestinal: Negative for nausea and vomiting.  Genitourinary: Positive for frequency. Negative for flank pain, hematuria, hesitancy and urgency.  Neurological: Positive for tingling and numbness.    Social History   Tobacco Use  . Smoking status: Former Smoker    Packs/day: 0.00    Years: 20.00    Pack years: 0.00    Quit date: 11/02/2003    Years since quitting: 15.9  . Smokeless tobacco: Former Network engineer Use Topics  . Alcohol use: Yes    Alcohol/week: 0.0 standard drinks    Comment: 2 drinks every weekend      Objective:    Vitals:   10/15/19 0826 10/15/19 0835  BP: (!) 169/117 124/79  Pulse: 94 65  Resp: 16   Temp: (!) 96.2 F (35.7 C)   TempSrc: Oral   Weight: (!) 392 lb 6.4 oz (178 kg)   Body mass index is 51.77 kg/m.   Physical Exam  General appearance: Severely obese male, cooperative and in no acute distress Head: Normocephalic, without obvious abnormality, atraumatic Respiratory: Respirations even and unlabored, normal respiratory rate Extremities: All extremities are intact.  Skin: Skin color, texture, turgor normal. No rashes seen  Psych: Appropriate mood and affect. Neurologic: Mental status: Alert, oriented to person, place, and time, thought content appropriate.  Results for orders placed or performed in visit on 10/15/19  POCT urinalysis dipstick  Result Value Ref Range   Color, UA yellow    Clarity, UA clear    Glucose,  UA Negative Negative   Bilirubin, UA negative    Ketones, UA negative    Spec Grav, UA 1.015 1.010 - 1.025   Blood, UA non hemolyzed trace    pH, UA 5.0 5.0 - 8.0   Protein, UA Positive (A) Negative   Urobilinogen, UA 0.2 0.2 or 1.0 E.U./dL   Nitrite, UA negative    Leukocytes, UA Negative Negative   Appearance     Odor         Assessment & Plan    1. Urinary frequency Likely some BPH. Anticipated trial of tamsulosin after reviewing labs.   2. Hyperglycemia  - HgB A1c  3. Urinary hesitancy   4.  Essential (primary) hypertension Stable. Continue current medications.    5. Pure hypercholesterolemia He is tolerating lovastatin well with no adverse effects.   - Comprehensive metabolic panel - Lipid panel (fasting)  6. Arthritis of right hand Unable to take oral NSAIDs being on Eliquis. Can try OTC Voltaren Gel.   7. Prostate cancer [facility]) S/p radiation therapy per Dr. Donella Stade. Due for surveillance PSA - PSA  The entirety of the information documented in the History of Present Illness, Review of Systems and Physical Exam were personally obtained by me. Portions of this information were initially documented by Minette Headland, CMA and reviewed by me for thoroughness and accuracy.      Lelon Huh, MD  [facility]

## 2019-10-15 NOTE — Patient Instructions (Addendum)
.   Please review the attached list of medications and notify my office if there are any errors.   . Please bring all of your medications to every appointment so we can make sure that our medication list is the same as yours.   . Try OTC Voltaren Gel for arthritis in hands

## 2019-10-16 ENCOUNTER — Other Ambulatory Visit: Payer: Self-pay | Admitting: Family Medicine

## 2019-10-16 LAB — COMPREHENSIVE METABOLIC PANEL
ALT: 31 IU/L (ref 0–44)
AST: 30 IU/L (ref 0–40)
Albumin/Globulin Ratio: 1.6 (ref 1.2–2.2)
Albumin: 4.6 g/dL (ref 3.8–4.8)
Alkaline Phosphatase: 76 IU/L (ref 39–117)
BUN/Creatinine Ratio: 15 (ref 10–24)
BUN: 15 mg/dL (ref 8–27)
Bilirubin Total: 0.6 mg/dL (ref 0.0–1.2)
CO2: 22 mmol/L (ref 20–29)
Calcium: 9.6 mg/dL (ref 8.6–10.2)
Chloride: 100 mmol/L (ref 96–106)
Creatinine, Ser: 1.01 mg/dL (ref 0.76–1.27)
GFR calc Af Amer: 90 mL/min/{1.73_m2} (ref >59)
GFR calc non Af Amer: 78 mL/min/{1.73_m2} (ref >59)
Globulin, Total: 2.8 g/dL (ref 1.5–4.5)
Glucose: 128 mg/dL — ABNORMAL HIGH (ref 65–99)
Potassium: 4.3 mmol/L (ref 3.5–5.2)
Sodium: 138 mmol/L (ref 134–144)
Total Protein: 7.4 g/dL (ref 6.0–8.5)

## 2019-10-16 LAB — LIPID PANEL
Chol/HDL Ratio: 5 ratio (ref 0.0–5.0)
Cholesterol, Total: 179 mg/dL (ref 100–199)
HDL: 36 mg/dL — ABNORMAL LOW (ref >39)
LDL Chol Calc (NIH): 121 mg/dL — ABNORMAL HIGH (ref 0–99)
Triglycerides: 118 mg/dL (ref 0–149)
VLDL Cholesterol Cal: 22 mg/dL (ref 5–40)

## 2019-10-16 LAB — HEMOGLOBIN A1C
Est. average glucose Bld gHb Est-mCnc: 123 mg/dL
Hgb A1c MFr Bld: 5.9 % — ABNORMAL HIGH (ref 4.8–5.6)

## 2019-10-16 LAB — PSA: Prostate Specific Ag, Serum: 1.2 ng/mL (ref 0.0–4.0)

## 2019-10-16 MED ORDER — TAMSULOSIN HCL 0.4 MG PO CAPS: 0.4000 mg | ORAL_CAPSULE | Freq: Every day | ORAL | 1 refills | Status: DC

## 2019-11-07 ENCOUNTER — Other Ambulatory Visit: Payer: Self-pay | Admitting: Family Medicine

## 2019-11-15 ENCOUNTER — Encounter: Payer: Self-pay | Admitting: Family Medicine

## 2019-11-26 ENCOUNTER — Encounter: Payer: Self-pay | Admitting: Family Medicine

## 2019-11-26 ENCOUNTER — Telehealth: Payer: Self-pay | Admitting: Family Medicine

## 2019-11-26 MED ORDER — TAMSULOSIN HCL 0.4 MG PO CAPS: 0.4000 mg | ORAL_CAPSULE | Freq: Every day | ORAL | 3 refills | Status: DC

## 2019-11-26 MED ORDER — TAMSULOSIN HCL 0.4 MG PO CAPS: 0.8000 mg | ORAL_CAPSULE | Freq: Every day | ORAL | 3 refills | Status: DC

## 2019-11-26 NOTE — Telephone Encounter (Addendum)
[facility] [place], but [facility] [place]. Can you please call [facility] [place] and cancel the prescription that sent there. Thanks!

## 2019-11-26 NOTE — Addendum Note (Signed)
Addended by: Birdie Sons on: 11/26/2019 04:34 PM   Modules accepted: Orders

## 2019-11-26 NOTE — Telephone Encounter (Signed)
Please advise if ok to contact pharmacy and give a verbal for patient to take twice daily. It is a message advising the patient that he could increase to twice daily but medication was send into the pharmacy as still one time daily.

## 2019-11-26 NOTE — Telephone Encounter (Signed)
Every time I try to refill this, it changes [facility] [place]. When I change to pharmacy [facility] [place], it changes to sig to one capsule daily.   I finally got the right prescription to go to the right pharmacy, but can you call and make sure the [facility] is [facility] [place], and [facility] [place] [facility] the prescription for TWO capsules daily.   Sorry about the confustion.

## 2019-11-26 NOTE — Telephone Encounter (Signed)
I called and cancelled prescription that was [facility] to [facility] [place]. I then called and verified that [facility] had the prescription for 2 capsules daily.

## 2019-12-27 ENCOUNTER — Other Ambulatory Visit: Payer: Self-pay | Admitting: Family Medicine

## 2019-12-27 NOTE — Telephone Encounter (Signed)
Request refill for lovastatin 40 mg. It is too soon to refill prescription. Request refill for losartan-hctz 100-25 mg. Prescription expired on 11/22/19

## 2020-01-16 DIAGNOSIS — I442 Atrioventricular block, complete: Secondary | ICD-10-CM | POA: Diagnosis not present

## 2020-02-04 DIAGNOSIS — D485 Neoplasm of uncertain behavior of skin: Secondary | ICD-10-CM | POA: Diagnosis not present

## 2020-02-04 DIAGNOSIS — L570 Actinic keratosis: Secondary | ICD-10-CM | POA: Diagnosis not present

## 2020-02-04 DIAGNOSIS — L814 Other melanin hyperpigmentation: Secondary | ICD-10-CM | POA: Diagnosis not present

## 2020-02-04 DIAGNOSIS — C44519 Basal cell carcinoma of skin of other part of trunk: Secondary | ICD-10-CM | POA: Diagnosis not present

## 2020-02-04 DIAGNOSIS — L821 Other seborrheic keratosis: Secondary | ICD-10-CM | POA: Diagnosis not present

## 2020-02-04 DIAGNOSIS — D0462 Carcinoma in situ of skin of left upper limb, including shoulder: Secondary | ICD-10-CM | POA: Diagnosis not present

## 2020-02-04 DIAGNOSIS — L723 Sebaceous cyst: Secondary | ICD-10-CM | POA: Diagnosis not present

## 2020-02-04 DIAGNOSIS — C44712 Basal cell carcinoma of skin of right lower limb, including hip: Secondary | ICD-10-CM | POA: Diagnosis not present

## 2020-02-28 DIAGNOSIS — H2513 Age-related nuclear cataract, bilateral: Secondary | ICD-10-CM | POA: Diagnosis not present

## 2020-03-17 DIAGNOSIS — D0439 Carcinoma in situ of skin of other parts of face: Secondary | ICD-10-CM | POA: Diagnosis not present

## 2020-03-17 DIAGNOSIS — C44712 Basal cell carcinoma of skin of right lower limb, including hip: Secondary | ICD-10-CM | POA: Diagnosis not present

## 2020-03-17 DIAGNOSIS — C44519 Basal cell carcinoma of skin of other part of trunk: Secondary | ICD-10-CM | POA: Diagnosis not present

## 2020-03-17 DIAGNOSIS — D485 Neoplasm of uncertain behavior of skin: Secondary | ICD-10-CM | POA: Diagnosis not present

## 2020-03-17 DIAGNOSIS — R21 Rash and other nonspecific skin eruption: Secondary | ICD-10-CM | POA: Diagnosis not present

## 2020-03-17 DIAGNOSIS — C4491 Basal cell carcinoma of skin, unspecified: Secondary | ICD-10-CM | POA: Diagnosis not present

## 2020-03-27 DIAGNOSIS — I350 Nonrheumatic aortic (valve) stenosis: Secondary | ICD-10-CM | POA: Diagnosis not present

## 2020-03-27 DIAGNOSIS — I10 Essential (primary) hypertension: Secondary | ICD-10-CM | POA: Diagnosis not present

## 2020-03-27 DIAGNOSIS — E7849 Other hyperlipidemia: Secondary | ICD-10-CM | POA: Diagnosis not present

## 2020-03-27 DIAGNOSIS — I4819 Other persistent atrial fibrillation: Secondary | ICD-10-CM | POA: Diagnosis not present

## 2020-03-27 DIAGNOSIS — I442 Atrioventricular block, complete: Secondary | ICD-10-CM | POA: Diagnosis not present

## 2020-04-24 DIAGNOSIS — C44712 Basal cell carcinoma of skin of right lower limb, including hip: Secondary | ICD-10-CM | POA: Diagnosis not present

## 2020-05-02 DIAGNOSIS — C44712 Basal cell carcinoma of skin of right lower limb, including hip: Secondary | ICD-10-CM | POA: Diagnosis not present

## 2020-05-09 DIAGNOSIS — C44712 Basal cell carcinoma of skin of right lower limb, including hip: Secondary | ICD-10-CM | POA: Diagnosis not present

## 2020-05-14 DIAGNOSIS — C44712 Basal cell carcinoma of skin of right lower limb, including hip: Secondary | ICD-10-CM | POA: Diagnosis not present

## 2020-05-20 DIAGNOSIS — C44712 Basal cell carcinoma of skin of right lower limb, including hip: Secondary | ICD-10-CM | POA: Diagnosis not present

## 2020-05-29 DIAGNOSIS — C44712 Basal cell carcinoma of skin of right lower limb, including hip: Secondary | ICD-10-CM | POA: Diagnosis not present

## 2020-06-05 DIAGNOSIS — C44712 Basal cell carcinoma of skin of right lower limb, including hip: Secondary | ICD-10-CM | POA: Diagnosis not present

## 2020-06-09 ENCOUNTER — Other Ambulatory Visit: Payer: Self-pay | Admitting: Family Medicine

## 2020-06-09 NOTE — Telephone Encounter (Signed)
Requested Prescriptions  Pending Prescriptions Disp Refills   lovastatin (MEVACOR) 40 MG tablet [Pharmacy Med Name: LOVASTATIN 40 MG TABLET] 90 tablet 1    Sig: TAKE 1 TABLET BY MOUTH EVERY DAY     Cardiovascular:  Antilipid - Statins Failed - 06/09/2020  9:53 PM      Failed - LDL in normal range and within 360 days    LDL Chol Calc (NIH)  Date Value Ref Range Status  10/15/2019 121 (H) 0 - 99 mg/dL Final         Failed - HDL in normal range and within 360 days    HDL  Date Value Ref Range Status  10/15/2019 36 (L) >39 mg/dL Final         Passed - Total Cholesterol in normal range and within 360 days    Cholesterol, Total  Date Value Ref Range Status  10/15/2019 179 100 - 199 mg/dL Final         Passed - Triglycerides in normal range and within 360 days    Triglycerides  Date Value Ref Range Status  10/15/2019 118 0 - 149 mg/dL Final         Passed - Patient is not pregnant      Passed - Valid encounter within last 12 months    Recent Outpatient Visits          7 months ago Urinary frequency   [facility] Birdie Sons, MD   9 months ago Upper respiratory tract infection, unspecified type   [facility], Dionne Bucy, MD   1 year ago Subacute pansinusitis   [place], Vickki Muff, [place]   1 year ago Need for shingles vaccine   [facility] Birdie Sons, MD   1 year ago Annual physical exam   [facility] Birdie Sons, MD

## 2020-06-10 DIAGNOSIS — C44712 Basal cell carcinoma of skin of right lower limb, including hip: Secondary | ICD-10-CM | POA: Diagnosis not present

## 2020-06-18 DIAGNOSIS — C44712 Basal cell carcinoma of skin of right lower limb, including hip: Secondary | ICD-10-CM | POA: Diagnosis not present

## 2020-06-26 DIAGNOSIS — C44712 Basal cell carcinoma of skin of right lower limb, including hip: Secondary | ICD-10-CM | POA: Diagnosis not present

## 2020-07-02 DIAGNOSIS — C44712 Basal cell carcinoma of skin of right lower limb, including hip: Secondary | ICD-10-CM | POA: Diagnosis not present

## 2020-07-10 DIAGNOSIS — C44712 Basal cell carcinoma of skin of right lower limb, including hip: Secondary | ICD-10-CM | POA: Diagnosis not present

## 2020-07-16 DIAGNOSIS — I442 Atrioventricular block, complete: Secondary | ICD-10-CM | POA: Diagnosis not present

## 2020-07-17 DIAGNOSIS — C44712 Basal cell carcinoma of skin of right lower limb, including hip: Secondary | ICD-10-CM | POA: Diagnosis not present

## 2020-07-24 DIAGNOSIS — C44712 Basal cell carcinoma of skin of right lower limb, including hip: Secondary | ICD-10-CM | POA: Diagnosis not present

## 2020-07-31 DIAGNOSIS — C44712 Basal cell carcinoma of skin of right lower limb, including hip: Secondary | ICD-10-CM | POA: Diagnosis not present

## 2020-08-11 DIAGNOSIS — Z85828 Personal history of other malignant neoplasm of skin: Secondary | ICD-10-CM | POA: Diagnosis not present

## 2020-08-11 DIAGNOSIS — L568 Other specified acute skin changes due to ultraviolet radiation: Secondary | ICD-10-CM | POA: Diagnosis not present

## 2020-08-11 DIAGNOSIS — D0439 Carcinoma in situ of skin of other parts of face: Secondary | ICD-10-CM | POA: Diagnosis not present

## 2020-08-11 DIAGNOSIS — Z85820 Personal history of malignant melanoma of skin: Secondary | ICD-10-CM | POA: Diagnosis not present

## 2020-08-11 DIAGNOSIS — L570 Actinic keratosis: Secondary | ICD-10-CM | POA: Diagnosis not present

## 2020-08-20 ENCOUNTER — Telehealth: Payer: Self-pay

## 2020-08-20 DIAGNOSIS — E7800 Pure hypercholesterolemia, unspecified: Secondary | ICD-10-CM

## 2020-08-20 DIAGNOSIS — R739 Hyperglycemia, unspecified: Secondary | ICD-10-CM

## 2020-08-20 DIAGNOSIS — I10 Essential (primary) hypertension: Secondary | ICD-10-CM

## 2020-08-20 DIAGNOSIS — C61 Malignant neoplasm of prostate: Secondary | ICD-10-CM

## 2020-08-20 NOTE — Telephone Encounter (Signed)
Future order has been placed. Needs to be fasting.

## 2020-08-20 NOTE — Telephone Encounter (Signed)
Please advise whether it is too soon to have labs done since his CPE appointment is not until 10/28/2020.

## 2020-08-20 NOTE — Telephone Encounter (Signed)
Patient wants to get lab work that "dr. Caryn Section usually does for his cpe".  He cannot get in for a CPE till Dec. But wants his labs done this month.    Please let patient know when the order is completed.

## 2020-08-21 NOTE — Telephone Encounter (Signed)
Patient advised and verbalized understanding 

## 2020-08-26 ENCOUNTER — Other Ambulatory Visit: Payer: Self-pay

## 2020-08-26 DIAGNOSIS — R739 Hyperglycemia, unspecified: Secondary | ICD-10-CM

## 2020-08-26 DIAGNOSIS — I10 Essential (primary) hypertension: Secondary | ICD-10-CM | POA: Diagnosis not present

## 2020-08-26 DIAGNOSIS — C61 Malignant neoplasm of prostate: Secondary | ICD-10-CM

## 2020-08-26 DIAGNOSIS — E7800 Pure hypercholesterolemia, unspecified: Secondary | ICD-10-CM

## 2020-08-27 ENCOUNTER — Other Ambulatory Visit: Payer: Self-pay

## 2020-08-27 ENCOUNTER — Ambulatory Visit (INDEPENDENT_AMBULATORY_CARE_PROVIDER_SITE_OTHER): Payer: Medicare Other

## 2020-08-27 DIAGNOSIS — Z23 Encounter for immunization: Secondary | ICD-10-CM | POA: Diagnosis not present

## 2020-08-27 LAB — CBC
Hematocrit: 40.6 % (ref 37.5–51.0)
Hemoglobin: 13.9 g/dL (ref 13.0–17.7)
MCH: 32.7 pg (ref 26.6–33.0)
MCHC: 34.2 g/dL (ref 31.5–35.7)
MCV: 96 fL (ref 79–97)
Platelets: 174 10*3/uL (ref 150–450)
RBC: 4.25 x10E6/uL (ref 4.14–5.80)
RDW: 13.7 % (ref 11.6–15.4)
WBC: 6.9 10*3/uL (ref 3.4–10.8)

## 2020-08-27 LAB — COMPREHENSIVE METABOLIC PANEL
ALT: 31 IU/L (ref 0–44)
AST: 33 IU/L (ref 0–40)
Albumin/Globulin Ratio: 1.6 (ref 1.2–2.2)
Albumin: 4.4 g/dL (ref 3.8–4.8)
Alkaline Phosphatase: 75 IU/L (ref 44–121)
BUN/Creatinine Ratio: 14 (ref 10–24)
BUN: 15 mg/dL (ref 8–27)
Bilirubin Total: 0.5 mg/dL (ref 0.0–1.2)
CO2: 21 mmol/L (ref 20–29)
Calcium: 9.4 mg/dL (ref 8.6–10.2)
Chloride: 102 mmol/L (ref 96–106)
Creatinine, Ser: 1.08 mg/dL (ref 0.76–1.27)
GFR calc Af Amer: 82 mL/min/{1.73_m2} (ref >59)
GFR calc non Af Amer: 71 mL/min/{1.73_m2} (ref >59)
Globulin, Total: 2.7 g/dL (ref 1.5–4.5)
Glucose: 135 mg/dL — ABNORMAL HIGH (ref 65–99)
Potassium: 4.4 mmol/L (ref 3.5–5.2)
Sodium: 138 mmol/L (ref 134–144)
Total Protein: 7.1 g/dL (ref 6.0–8.5)

## 2020-08-27 LAB — LIPID PANEL
Chol/HDL Ratio: 4.3 ratio (ref 0.0–5.0)
Cholesterol, Total: 159 mg/dL (ref 100–199)
HDL: 37 mg/dL — ABNORMAL LOW (ref >39)
LDL Chol Calc (NIH): 104 mg/dL — ABNORMAL HIGH (ref 0–99)
Triglycerides: 99 mg/dL (ref 0–149)
VLDL Cholesterol Cal: 18 mg/dL (ref 5–40)

## 2020-08-27 LAB — HEMOGLOBIN A1C
Est. average glucose Bld gHb Est-mCnc: 131 mg/dL
Hgb A1c MFr Bld: 6.2 % — ABNORMAL HIGH (ref 4.8–5.6)

## 2020-08-27 LAB — PSA: Prostate Specific Ag, Serum: 0.5 ng/mL (ref 0.0–4.0)

## 2020-09-29 DIAGNOSIS — D485 Neoplasm of uncertain behavior of skin: Secondary | ICD-10-CM | POA: Diagnosis not present

## 2020-09-29 DIAGNOSIS — C44622 Squamous cell carcinoma of skin of right upper limb, including shoulder: Secondary | ICD-10-CM | POA: Diagnosis not present

## 2020-09-29 DIAGNOSIS — D0439 Carcinoma in situ of skin of other parts of face: Secondary | ICD-10-CM | POA: Diagnosis not present

## 2020-09-29 DIAGNOSIS — B354 Tinea corporis: Secondary | ICD-10-CM | POA: Diagnosis not present

## 2020-10-20 DIAGNOSIS — C44719 Basal cell carcinoma of skin of left lower limb, including hip: Secondary | ICD-10-CM | POA: Diagnosis not present

## 2020-10-20 DIAGNOSIS — D0439 Carcinoma in situ of skin of other parts of face: Secondary | ICD-10-CM | POA: Diagnosis not present

## 2020-10-20 DIAGNOSIS — D485 Neoplasm of uncertain behavior of skin: Secondary | ICD-10-CM | POA: Diagnosis not present

## 2020-10-20 DIAGNOSIS — R21 Rash and other nonspecific skin eruption: Secondary | ICD-10-CM | POA: Diagnosis not present

## 2020-10-21 DIAGNOSIS — I350 Nonrheumatic aortic (valve) stenosis: Secondary | ICD-10-CM | POA: Diagnosis not present

## 2020-10-21 DIAGNOSIS — I10 Essential (primary) hypertension: Secondary | ICD-10-CM | POA: Diagnosis not present

## 2020-10-21 DIAGNOSIS — I4819 Other persistent atrial fibrillation: Secondary | ICD-10-CM | POA: Diagnosis not present

## 2020-10-21 DIAGNOSIS — I442 Atrioventricular block, complete: Secondary | ICD-10-CM | POA: Diagnosis not present

## 2020-10-21 DIAGNOSIS — Z950 Presence of cardiac pacemaker: Secondary | ICD-10-CM | POA: Diagnosis not present

## 2020-10-28 ENCOUNTER — Encounter: Payer: Self-pay | Admitting: Family Medicine

## 2020-10-28 ENCOUNTER — Other Ambulatory Visit: Payer: Self-pay

## 2020-10-28 ENCOUNTER — Ambulatory Visit (INDEPENDENT_AMBULATORY_CARE_PROVIDER_SITE_OTHER): Payer: Medicare Other | Admitting: Family Medicine

## 2020-10-28 VITALS — BP 122/73 | HR 79 | Temp 97.3°F | Resp 18 | Ht 73.0 in | Wt >= 6400 oz

## 2020-10-28 DIAGNOSIS — R3589 Other polyuria: Secondary | ICD-10-CM

## 2020-10-28 DIAGNOSIS — R351 Nocturia: Secondary | ICD-10-CM

## 2020-10-28 DIAGNOSIS — Z23 Encounter for immunization: Secondary | ICD-10-CM | POA: Diagnosis not present

## 2020-10-28 DIAGNOSIS — Z0000 Encounter for general adult medical examination without abnormal findings: Secondary | ICD-10-CM

## 2020-10-28 DIAGNOSIS — G4719 Other hypersomnia: Secondary | ICD-10-CM | POA: Diagnosis not present

## 2020-10-28 NOTE — Progress Notes (Signed)
Complete physical exam     Patient: Garrett Woods, Male    DOB: [birth year], 64 y.o.   MRN: [id number] z Visit Date: 10/28/2020  Today's Provider: Mila Merry, MD   Chief Complaint  Patient presents with  . Hypertension  . Hyperlipidemia  . Annual Exam   Subjective    Garrett Woods is a 64 y.o. male who presents today for his Complete physical exam.  He reports consuming a general diet. The patient does not participate in regular exercise at present. He generally feels fairly well. He reports sleeping poorly. He does have additional problems to discuss today.   HPI Hypertension, follow-up  BP Readings from Last 3 Encounters:  10/28/20 122/73  10/15/19 124/79  10/09/18 130/80   Wt Readings from Last 3 Encounters:  10/28/20 (!) 407 lb (184.6 kg)  10/15/19 (!) 392 lb 6.4 oz (178 kg)  10/09/18 (!) 383 lb (173.7 kg)     He was last seen for hypertension more than 1 year ago.    Management since that visit includes continuing the same medications.  He reports good compliance with treatment. He is not having side effects.  He is following a Regular diet. He is not exercising. He does not smoke.  Use of agents associated with hypertension: none.   Outside blood pressures are 130/90. Symptoms: No chest pain No chest pressure  No palpitations No syncope  No dyspnea No orthopnea  No paroxysmal nocturnal dyspnea No lower extremity edema   Pertinent labs: Lab Results  Component Value Date   CHOL 159 08/26/2020   HDL 37 (L) 08/26/2020   LDLCALC 104 (H) 08/26/2020   TRIG 99 08/26/2020   CHOLHDL 4.3 08/26/2020   Lab Results  Component Value Date   NA 138 08/26/2020   K 4.4 08/26/2020   CREATININE 1.08 08/26/2020   GFRNONAA 71 08/26/2020   GFRAA 82 08/26/2020   GLUCOSE 135 (H) 08/26/2020     The 10-year ASCVD risk score Garrett George DC Jr., et al., 2013) is: 14.9%    ---------------------------------------------------------------------------------------------------  Lipid/Cholesterol, Follow-up  Last lipid panel Other pertinent labs  Lab Results  Component Value Date   CHOL 159 08/26/2020   HDL 37 (L) 08/26/2020   LDLCALC 104 (H) 08/26/2020   TRIG 99 08/26/2020   CHOLHDL 4.3 08/26/2020   Lab Results  Component Value Date   ALT 31 08/26/2020   AST 33 08/26/2020   PLT 174 08/26/2020   TSH 2.680 06/22/2018     Labs were last checked 2 months ago and no changes were made.  He reports good compliance with treatment. He is not having side effects.   Symptoms: No chest pain No chest pressure/discomfort  No dyspnea No lower extremity edema  No numbness or tingling of extremity No orthopnea  No palpitations No paroxysmal nocturnal dyspnea  No speech difficulty No syncope   Current diet: DASH Diet Current exercise: none  The 10-year ASCVD risk score Garrett George DC Jr., et al., 2013) is: 14.9%  ---------------------------------------------------------------------------------------------------  He also complains of poor sleep having to get up to urinate several times at night. Also has to go every hour or two throughout the day. Has not noticed any improvement with tamsulosin. He does have history of prostatectomy s/p radiation therapy in 2016. PSA have been consistently normal since then and is no longer being followed by urology. He also reports being very sleeping and will fall asleep easily during the day.      Medications: Outpatient  Medications Prior to Visit  Medication Sig  . acetaminophen (TYLENOL) 500 MG tablet Take 1,000 mg by mouth every 6 (six) hours as needed for mild pain.  Marland Kitchen apixaban (ELIQUIS) 5 MG TABS tablet Take by mouth.  . losartan-hydrochlorothiazide (HYZAAR) 100-25 MG tablet TAKE 1 TABLET BY MOUTH EVERY DAY  . lovastatin (MEVACOR) 40 MG tablet TAKE 1 TABLET BY MOUTH EVERY DAY  . tamsulosin (FLOMAX) 0.4 MG CAPS capsule  Take 2 capsules (0.8 mg total) by mouth daily.  . [DISCONTINUED] ibuprofen (ADVIL,MOTRIN) 200 MG tablet Take 400 mg by mouth every 6 (six) hours as needed for mild pain.   No facility-administered medications prior to visit.    No Known Allergies  Patient Care Team: Birdie Sons, MD as PCP - General (Family Medicine) Christene Slates, MD (Dermatology) Alexis Goodell, MD as Consulting Physician (Neurology) Noreene Filbert, MD as Referring Physician (Radiation Oncology) Collier Flowers, MD as Referring Physician (Urology) Corey Skains, MD as Consulting Physician (Cardiology)  Review of Systems  Constitutional: Negative for appetite change, chills, fatigue and fever.  HENT: Negative for congestion, ear pain, hearing loss, nosebleeds and trouble swallowing.   Eyes: Negative for pain and visual disturbance.  Respiratory: Positive for shortness of breath. Negative for cough and chest tightness.   Cardiovascular: Negative for chest pain, palpitations and leg swelling.  Gastrointestinal: Negative for abdominal pain, blood in stool, constipation, diarrhea, nausea and vomiting.  Endocrine: Negative for polydipsia, polyphagia and polyuria.  Genitourinary: Positive for frequency. Negative for dysuria and flank pain.  Musculoskeletal: Negative for arthralgias, back pain, joint swelling, myalgias and neck stiffness.  Skin: Negative for color change, rash and wound.  Neurological: Negative for dizziness, tremors, seizures, speech difficulty, weakness, light-headedness and headaches.  Psychiatric/Behavioral: Negative for behavioral problems, confusion, decreased concentration, dysphoric mood and sleep disturbance. The patient is not nervous/anxious.   All other systems reviewed and are negative.     Objective    Vitals: BP 122/73 (BP Location: Right Arm, Patient Position: Sitting, Cuff Size: Large)   Pulse 79   Temp (!) 97.3 F (36.3 C) (Temporal)   Resp 18   Ht 6\' 1"  (1.854 m)    Wt (!) 407 lb (184.6 kg)   SpO2 96% Comment: room air  BMI 53.70 kg/m    Physical Exam   General Appearance:    Severely obese male. Alert, cooperative, in no acute distress, appears stated age  Head:    Normocephalic, without obvious abnormality, atraumatic  Eyes:    PERRL, conjunctiva/corneas clear, EOM's intact, fundi    benign, both eyes       Ears:    Normal TM's and external ear canals, both ears  Neck:   Supple, symmetrical, trachea midline, no adenopathy;       thyroid:  No enlargement/tenderness/nodules; no carotid   bruit or JVD  Back:     Symmetric, no curvature, ROM normal, no CVA tenderness  Lungs:     Clear to auscultation bilaterally, respirations unlabored  Chest wall:    No tenderness or deformity  Heart:    Normal heart rate. Normal rhythm. No murmurs, rubs, or gallops.  S1 and S2 normal  Abdomen:     Soft, non-tender, bowel sounds active all four quadrants,    no masses, no organomegaly  Genitalia:    deferred  Rectal:    deferred  Extremities:   All extremities are intact. 3+ bilateral lower leg edema.   Pulses:   2+ and symmetric all extremities  Skin:   Skin color, texture, turgor normal, no rashes or lesions  Lymph nodes:   Cervical, supraclavicular, and axillary nodes normal  Neurologic:   CNII-XII intact. Normal strength, sensation and reflexes      throughout     Assessment & Plan     1. Annual physical exam  2. Nocturia I doubt this is related to prostate since he reports normal stream and there has been no improvement with tamsulosin. He may have other sleep disorders perceived is nocturnal urgency. He is also on hctz for blood pressure which may be contributing to sx.   3. Polyuria No improvement with tamsulosin as above. Advised he may need to re-establish with urologist.   4. Excessive daytime sleepiness  - Home sleep test  5. Obesity, morbid (more than 100 lbs over ideal weight or BMI > 40) (HCC) Has started DASH diet.  - Amb Ref to  Medical Weight Management  6. Need for vaccination against Streptococcus pneumoniae  - Pneumococcal polysaccharide vaccine 23-valent greater than or equal to 2yo subcutaneous/IM        The entirety of the information documented in the History of Present Illness, Review of Systems and Physical Exam were personally obtained by me. Portions of this information were initially documented by the CMA and reviewed by me for thoroughness and accuracy.      Lelon Huh, MD  [facility] [phone] (phone) [phone] (fax)  [place]

## 2020-10-28 NOTE — Patient Instructions (Addendum)
.   Please review the attached list of medications and notify my office if there are any errors.   . You don't need to take Flomax (tamsulosin) if it's not helping

## 2020-10-28 NOTE — Progress Notes (Signed)
Annual Wellness Visit     Patient: Garrett Woods, Male    DOB: Sep 09, 1956, 64 y.o.   MRN: [id number] Visit Date: 10/28/2020  Today's Provider: Mila Merry, MD   Chief Complaint  Patient presents with  . Medicare Wellness   Subjective    Garrett Woods is a 64 y.o. male who presents today for his Annual Wellness Visit.      Medications: Outpatient Medications Prior to Visit  Medication Sig  . acetaminophen (TYLENOL) 500 MG tablet Take 1,000 mg by mouth every 6 (six) hours as needed for mild pain.  Marland Kitchen apixaban (ELIQUIS) 5 MG TABS tablet Take by mouth.  . losartan-hydrochlorothiazide (HYZAAR) 100-25 MG tablet TAKE 1 TABLET BY MOUTH EVERY DAY  . lovastatin (MEVACOR) 40 MG tablet TAKE 1 TABLET BY MOUTH EVERY DAY  . tamsulosin (FLOMAX) 0.4 MG CAPS capsule Take 2 capsules (0.8 mg total) by mouth daily.  . [DISCONTINUED] ibuprofen (ADVIL,MOTRIN) 200 MG tablet Take 400 mg by mouth every 6 (six) hours as needed for mild pain.   No facility-administered medications prior to visit.    No Known Allergies  Patient Care Team: Malva Limes, MD as PCP - General (Family Medicine) Nancy Marus, MD (Dermatology) Thana Farr, MD as Consulting Physician (Neurology) Carmina Miller, MD as Referring Physician (Radiation Oncology) Lorraine Lax, MD as Referring Physician (Urology) Lamar Blinks, MD as Consulting Physician (Cardiology)   Objective      Most recent functional status assessment: In your present state of health, do you have any difficulty performing the following activities: 10/28/2020  Hearing? N  Vision? N  Difficulty concentrating or making decisions? N  Walking or climbing stairs? Y  Dressing or bathing? N  Doing errands, shopping? N  Some recent data might be hidden   Most recent fall risk assessment: Fall Risk  10/28/2020  Falls in the past year? 0  Number falls in past yr: 0  Injury with Fall? 0  Follow up Falls evaluation  completed    Most recent depression screenings: PHQ 2/9 Scores 10/28/2020 06/22/2018  PHQ - 2 Score 0 0  PHQ- 9 Score 0 0   Most recent cognitive screening: No flowsheet data found. Most recent Audit-C alcohol use screening Alcohol Use Disorder Test (AUDIT) 10/28/2020  1. How often do you have a drink containing alcohol? 1  2. How many drinks containing alcohol do you have on a typical day when you are drinking? 0  3. How often do you have six or more drinks on one occasion? 0  AUDIT-C Score 1  Alcohol Brief Interventions/Follow-up AUDIT Score <7 follow-up not indicated   A score of 3 or more in women, and 4 or more in men indicates increased risk for alcohol abuse, EXCEPT if all of the points are from question 1   No results found for any visits on 10/28/20.  Assessment & Plan     Annual wellness visit done today including the all of the following: Reviewed patient's Family Medical History Reviewed and updated list of patient's medical providers Assessment of cognitive impairment was done Assessed patient's functional ability Established a written schedule for health screening services Health Risk Assessent Completed and Reviewed  Exercise Activities and Dietary recommendations Goals   None     Immunization History  Administered Date(s) Administered  . Fluad Quad(high Dose 65+) 10/15/2019, 08/27/2020  . Influenza,inj,Quad PF,6+ Mos 10/08/2016  . PFIZER SARS-COV-2 Vaccination 11/27/2019, 12/18/2019, 08/01/2020  . Pneumococcal Polysaccharide-23 10/28/2020  .  Tdap 03/20/2013  . Zoster Recombinat (Shingrix) 06/22/2018, 08/22/2018    Health Maintenance  Topic Date Due  . Hepatitis C Screening  Never done  . COVID-19 Vaccine (4 - Booster for Pfizer series) 01/30/2021  . PNA vac Low Risk Adult (2 of 2 - PCV13) 10/28/2021  . TETANUS/TDAP  03/21/2023  . COLONOSCOPY (Pts 45-87yrs Insurance coverage will need to be confirmed)  02/08/2027  . INFLUENZA VACCINE  Completed      Discussed health benefits of physical activity, and encouraged him to engage in regular exercise appropriate for his age and condition.      No follow-ups on file.     The entirety of the information documented in the History of Present Illness, Review of Systems and Physical Exam were personally obtained by me. Portions of this information were initially documented by the CMA and reviewed by me for thoroughness and accuracy.      Mila Merry, MD  [facility] [phone] (phone) [phone] (fax)  [facility]

## 2020-11-13 ENCOUNTER — Other Ambulatory Visit: Payer: Self-pay | Admitting: Family Medicine

## 2020-12-01 DIAGNOSIS — L570 Actinic keratosis: Secondary | ICD-10-CM | POA: Diagnosis not present

## 2020-12-01 DIAGNOSIS — C44319 Basal cell carcinoma of skin of other parts of face: Secondary | ICD-10-CM | POA: Diagnosis not present

## 2020-12-01 DIAGNOSIS — L920 Granuloma annulare: Secondary | ICD-10-CM | POA: Diagnosis not present

## 2020-12-08 ENCOUNTER — Other Ambulatory Visit: Payer: Self-pay | Admitting: Family Medicine

## 2020-12-17 ENCOUNTER — Other Ambulatory Visit (HOSPITAL_COMMUNITY): Payer: Self-pay | Admitting: Orthopedic Surgery

## 2020-12-17 DIAGNOSIS — M25562 Pain in left knee: Secondary | ICD-10-CM

## 2020-12-17 DIAGNOSIS — G7000 Myasthenia gravis without (acute) exacerbation: Secondary | ICD-10-CM | POA: Diagnosis not present

## 2020-12-17 DIAGNOSIS — M2342 Loose body in knee, left knee: Secondary | ICD-10-CM | POA: Diagnosis not present

## 2020-12-26 ENCOUNTER — Other Ambulatory Visit: Payer: Self-pay | Admitting: Family Medicine

## 2020-12-26 DIAGNOSIS — I10 Essential (primary) hypertension: Secondary | ICD-10-CM

## 2020-12-26 MED ORDER — VALSARTAN-HYDROCHLOROTHIAZIDE 160-25 MG PO TABS: 1.0000 | ORAL_TABLET | Freq: Every day | ORAL | 1 refills | Status: DC

## 2020-12-29 ENCOUNTER — Encounter: Payer: Self-pay | Admitting: Family Medicine

## 2021-01-08 ENCOUNTER — Other Ambulatory Visit: Payer: Self-pay

## 2021-01-08 ENCOUNTER — Encounter (HOSPITAL_COMMUNITY): Payer: Self-pay

## 2021-01-08 ENCOUNTER — Ambulatory Visit (HOSPITAL_COMMUNITY)
Admission: RE | Admit: 2021-01-08 | Discharge: 2021-01-08 | Disposition: A | Discharge status: Home / Self Care | Payer: Medicare Other | Source: Ambulatory Visit | Attending: Student | Admitting: Student

## 2021-01-08 ENCOUNTER — Ambulatory Visit (HOSPITAL_COMMUNITY)
Admission: RE | Admit: 2021-01-08 | Discharge: 2021-01-08 | Disposition: A | Discharge status: Home / Self Care | Payer: Medicare Other | Source: Ambulatory Visit | Attending: Orthopedic Surgery | Admitting: Orthopedic Surgery

## 2021-01-08 DIAGNOSIS — M47814 Spondylosis without myelopathy or radiculopathy, thoracic region: Secondary | ICD-10-CM | POA: Diagnosis not present

## 2021-01-08 DIAGNOSIS — Z950 Presence of cardiac pacemaker: Secondary | ICD-10-CM

## 2021-01-08 DIAGNOSIS — M25562 Pain in left knee: Secondary | ICD-10-CM

## 2021-01-08 DIAGNOSIS — M2342 Loose body in knee, left knee: Secondary | ICD-10-CM

## 2021-01-08 NOTE — Progress Notes (Addendum)
Informed of MRI for today.   Device system confirmed to be MRI conditional, with implant date > 6 weeks ago and no evidence of abandoned or epicardial leads in review of most recent CXR   Interrogation from today reviewed, pt is currently AF-VP at ~ 60 bpm  Pt tells MRI staff he has a known history of likely lead fracture.  RV lead impedence trends high >2800 ohms with intermittent lows consistent with fracture.   He is dependent based on high percentage of V pacing on interrogation (>99%).  We cannot guarantee safety of the lead and therefore DO NOT recommend MRI in this patient. Above reviewed with our Medtronic Rep who agrees.   Shirley Friar, PA-C  01/08/2021 1:34 PM

## 2021-01-16 DIAGNOSIS — M1711 Unilateral primary osteoarthritis, right knee: Secondary | ICD-10-CM | POA: Diagnosis not present

## 2021-02-16 DIAGNOSIS — M1710 Unilateral primary osteoarthritis, unspecified knee: Secondary | ICD-10-CM | POA: Diagnosis not present

## 2021-03-02 ENCOUNTER — Ambulatory Visit: Payer: Self-pay | Admitting: Family Medicine

## 2021-03-04 DIAGNOSIS — H2513 Age-related nuclear cataract, bilateral: Secondary | ICD-10-CM | POA: Diagnosis not present

## 2021-04-06 DIAGNOSIS — H2511 Age-related nuclear cataract, right eye: Secondary | ICD-10-CM | POA: Diagnosis not present

## 2021-04-08 ENCOUNTER — Encounter: Payer: Self-pay | Admitting: Ophthalmology

## 2021-04-20 ENCOUNTER — Ambulatory Visit: Payer: Medicare Other | Admitting: Anesthesiology

## 2021-04-20 ENCOUNTER — Ambulatory Visit
Admission: RE | Admit: 2021-04-20 | Discharge: 2021-04-20 | Disposition: A | Discharge status: Home / Self Care | Payer: Medicare Other | Attending: Ophthalmology | Admitting: Ophthalmology

## 2021-04-20 ENCOUNTER — Encounter
Admission: RE | Disposition: A | Discharge status: Home / Self Care | Payer: Self-pay | Source: Home / Self Care | Attending: Ophthalmology

## 2021-04-20 ENCOUNTER — Encounter: Payer: Self-pay | Admitting: Ophthalmology

## 2021-04-20 ENCOUNTER — Other Ambulatory Visit: Payer: Self-pay

## 2021-04-20 DIAGNOSIS — Z96651 Presence of right artificial knee joint: Secondary | ICD-10-CM | POA: Insufficient documentation

## 2021-04-20 DIAGNOSIS — H2511 Age-related nuclear cataract, right eye: Secondary | ICD-10-CM | POA: Insufficient documentation

## 2021-04-20 DIAGNOSIS — Z79899 Other long term (current) drug therapy: Secondary | ICD-10-CM | POA: Diagnosis not present

## 2021-04-20 DIAGNOSIS — Z8546 Personal history of malignant neoplasm of prostate: Secondary | ICD-10-CM | POA: Insufficient documentation

## 2021-04-20 DIAGNOSIS — Z87891 Personal history of nicotine dependence: Secondary | ICD-10-CM | POA: Diagnosis not present

## 2021-04-20 DIAGNOSIS — Z833 Family history of diabetes mellitus: Secondary | ICD-10-CM | POA: Insufficient documentation

## 2021-04-20 DIAGNOSIS — Z8619 Personal history of other infectious and parasitic diseases: Secondary | ICD-10-CM | POA: Insufficient documentation

## 2021-04-20 DIAGNOSIS — H25811 Combined forms of age-related cataract, right eye: Secondary | ICD-10-CM | POA: Diagnosis not present

## 2021-04-20 DIAGNOSIS — Z950 Presence of cardiac pacemaker: Secondary | ICD-10-CM | POA: Diagnosis not present

## 2021-04-20 DIAGNOSIS — Z8249 Family history of ischemic heart disease and other diseases of the circulatory system: Secondary | ICD-10-CM | POA: Diagnosis not present

## 2021-04-20 DIAGNOSIS — Z85820 Personal history of malignant melanoma of skin: Secondary | ICD-10-CM | POA: Diagnosis not present

## 2021-04-20 HISTORY — DX: Unspecified atrial fibrillation: I48.91

## 2021-04-20 HISTORY — PX: CATARACT EXTRACTION W/PHACO: SHX586

## 2021-04-20 SURGERY — PHACOEMULSIFICATION, CATARACT, WITH IOL INSERTION
Anesthesia: Monitor Anesthesia Care | Site: Eye | Laterality: Right

## 2021-04-20 MED ORDER — LACTATED RINGERS IV SOLN: INTRAVENOUS | Status: DC

## 2021-04-20 MED ORDER — FENTANYL CITRATE (PF) 100 MCG/2ML IJ SOLN
INTRAMUSCULAR | Status: DC | PRN
  Administered 2021-04-20: 50 ug via INTRAVENOUS

## 2021-04-20 MED ORDER — MOXIFLOXACIN HCL 0.5 % OP SOLN
OPHTHALMIC | Status: DC | PRN
  Administered 2021-04-20: 0.2 mL via OPHTHALMIC

## 2021-04-20 MED ORDER — TETRACAINE HCL 0.5 % OP SOLN
1.0000 [drp] | OPHTHALMIC | Status: DC | PRN
  Administered 2021-04-20 (×3): 1 [drp] via OPHTHALMIC

## 2021-04-20 MED ORDER — PHENYLEPHRINE HCL 10 % OP SOLN
1.0000 [drp] | OPHTHALMIC | Status: AC
  Administered 2021-04-20 (×3): 1 [drp] via OPHTHALMIC

## 2021-04-20 MED ORDER — SODIUM HYALURONATE 10 MG/ML IO SOLUTION
PREFILLED_SYRINGE | INTRAOCULAR | Status: DC | PRN
  Administered 2021-04-20: 0.4 mL via INTRAOCULAR

## 2021-04-20 MED ORDER — MIDAZOLAM HCL 2 MG/2ML IJ SOLN
INTRAMUSCULAR | Status: DC | PRN
  Administered 2021-04-20: 1 mg via INTRAVENOUS

## 2021-04-20 MED ORDER — EPINEPHRINE PF 1 MG/ML IJ SOLN
INTRAOCULAR | Status: DC | PRN
  Administered 2021-04-20: 55 mL via OPHTHALMIC

## 2021-04-20 MED ORDER — SODIUM HYALURONATE 23MG/ML IO SOSY
PREFILLED_SYRINGE | INTRAOCULAR | Status: DC | PRN
  Administered 2021-04-20: 0.6 mL via INTRAOCULAR

## 2021-04-20 MED ORDER — CYCLOPENTOLATE HCL 2 % OP SOLN
1.0000 [drp] | OPHTHALMIC | Status: AC
  Administered 2021-04-20 (×3): 1 [drp] via OPHTHALMIC

## 2021-04-20 MED ORDER — LIDOCAINE HCL (PF) 2 % IJ SOLN
INTRAOCULAR | Status: DC | PRN
  Administered 2021-04-20: 1 mL via INTRAOCULAR

## 2021-04-20 SURGICAL SUPPLY — 15 items
CANNULA ANT/CHMB 27GA (MISCELLANEOUS) ×6 IMPLANT
DISSECTOR HYDRO NUCLEUS 50X22 (MISCELLANEOUS) ×3 IMPLANT
GLOVE SURG SYN 8.5  E (GLOVE) ×2
GLOVE SURG SYN 8.5 E (GLOVE) ×1 IMPLANT
GOWN STRL REUS W/ TWL LRG LVL3 (GOWN DISPOSABLE) ×2 IMPLANT
GOWN STRL REUS W/TWL LRG LVL3 (GOWN DISPOSABLE) ×6
LENS IOL TECNIS EYHANCE 19.5 (Intraocular Lens) ×3 IMPLANT
MARKER SKIN DUAL TIP RULER LAB (MISCELLANEOUS) ×3 IMPLANT
PACK DR. KING ARMS (PACKS) IMPLANT
PACK EYE AFTER SURG (MISCELLANEOUS) ×3 IMPLANT
PACK OPTHALMIC (MISCELLANEOUS) IMPLANT
SYR 3ML LL SCALE MARK (SYRINGE) ×3 IMPLANT
SYR TB 1ML LUER SLIP (SYRINGE) ×3 IMPLANT
WATER STERILE IRR 250ML POUR (IV SOLUTION) ×3 IMPLANT
WIPE NON LINTING 3.25X3.25 (MISCELLANEOUS) ×3 IMPLANT

## 2021-04-20 NOTE — Op Note (Signed)
OPERATIVE NOTE  OSBORN PULLIN [id number] 04/20/2021   PREOPERATIVE DIAGNOSIS:  Nuclear sclerotic cataract right eye.  H25.11   POSTOPERATIVE DIAGNOSIS:    Nuclear sclerotic cataract right eye.     PROCEDURE:  Phacoemusification with posterior chamber intraocular lens placement of the right eye   LENS:   Implant Name Type Inv. Item Serial No. Manufacturer Lot No. LRB No. Used Action  LENS IOL TECNIS EYHANCE 19.5 - [id number] Intraocular Lens LENS IOL TECNIS EYHANCE 19.5 [id number] JOHNSON   Right 1 Implanted       Procedure(s) with comments: CATARACT EXTRACTION PHACO AND INTRAOCULAR LENS PLACEMENT (IOC) RIGHT (Right) - 3.30 0:25.1  DIB00 +19.5   SURGEON:  Benay Pillow, MD, MPH  ANESTHESIOLOGIST: Anesthesiologist: Ardeth Sportsman, MD CRNA: Cameron Ali, CRNA   ANESTHESIA:  Topical with tetracaine drops augmented with 1% preservative-free intracameral lidocaine.  ESTIMATED BLOOD LOSS: less than 1 mL.   COMPLICATIONS:  None.   DESCRIPTION OF PROCEDURE:  The patient was identified in the holding room and transported to the operating room and placed in the supine position under the operating microscope.  The right eye was identified as the operative eye and it was prepped and draped in the usual sterile ophthalmic fashion.   A 1.0 millimeter clear-corneal paracentesis was made at the 10:30 position. 0.5 ml of preservative-free 1% lidocaine with epinephrine was injected into the anterior chamber.  The anterior chamber was filled with Healon 5 viscoelastic.  A 2.4 millimeter keratome was used to make a near-clear corneal incision at the 8:00 position.  A curvilinear capsulorrhexis was made with a cystotome and capsulorrhexis forceps.  Balanced salt solution was used to hydrodissect and hydrodelineate the nucleus.   Phacoemulsification was then used in stop and chop fashion to remove the lens nucleus and epinucleus.  The remaining cortex was then removed using the irrigation and  aspiration handpiece. Healon was then placed into the capsular bag to distend it for lens placement.  A lens was then injected into the capsular bag.  The remaining viscoelastic was aspirated.   Wounds were hydrated with balanced salt solution.  The anterior chamber was inflated to a physiologic pressure with balanced salt solution.   Intracameral vigamox 0.1 mL undiluted was injected into the eye and a drop placed onto the ocular surface.  No wound leaks were noted.  The patient was taken to the recovery room in stable condition without complications of anesthesia or surgery  Benay Pillow 04/20/2021, 11:11 AM

## 2021-04-20 NOTE — Anesthesia Postprocedure Evaluation (Signed)
Anesthesia Post Note  Patient: Garrett Woods  Procedure(s) Performed: CATARACT EXTRACTION PHACO AND INTRAOCULAR LENS PLACEMENT (IOC) RIGHT (Right: Eye)     Patient location during evaluation: PACU Anesthesia Type: MAC Level of consciousness: awake and alert Pain management: pain level controlled Vital Signs Assessment: post-procedure vital signs reviewed and stable Respiratory status: nonlabored ventilation and spontaneous breathing Cardiovascular status: blood pressure returned to baseline Postop Assessment: no apparent nausea or vomiting Anesthetic complications: no   No notable events documented.  Jolleen Seman [employer]

## 2021-04-20 NOTE — Transfer of Care (Signed)
Immediate Anesthesia Transfer of Care Note  Patient: Garrett Woods  Procedure(s) Performed: CATARACT EXTRACTION PHACO AND INTRAOCULAR LENS PLACEMENT (IOC) RIGHT (Right: Eye)  Patient Location: PACU  Anesthesia Type: MAC  Level of Consciousness: awake, alert  and patient cooperative  Airway and Oxygen Therapy: Patient Spontanous Breathing and Patient connected to supplemental oxygen  Post-op Assessment: Post-op Vital signs reviewed, Patient's Cardiovascular Status Stable, Respiratory Function Stable, Patent Airway and No signs of Nausea or vomiting  Post-op Vital Signs: Reviewed and stable  Complications: No notable events documented.

## 2021-04-20 NOTE — Anesthesia Preprocedure Evaluation (Addendum)
Anesthesia Evaluation  Patient identified by MRN, date of birth, ID band Patient awake    Reviewed: Allergy & Precautions, H&P , NPO status , Patient's Chart, lab work & pertinent test results  Airway Mallampati: II  TM Distance: >3 FB Neck ROM: full    Dental no notable dental hx.    Pulmonary former smoker,    Pulmonary exam normal        Cardiovascular hypertension, Normal cardiovascular exam+ dysrhythmias + pacemaker      Neuro/Psych Myasthenia gravis - mild disease, does not take medication. In the past, he had eye involvement, but no symptoms recently.  Neuromuscular disease negative psych ROS   GI/Hepatic negative GI ROS, Neg liver ROS,   Endo/Other  Morbid obesity (BMI 49)  Renal/GU      Musculoskeletal  (+) Arthritis ,   Abdominal (+) + obese,   Peds  Hematology   Anesthesia Other Findings   Reproductive/Obstetrics                            Anesthesia Physical  Anesthesia Plan  ASA: 3  Anesthesia Plan: MAC   Post-op Pain Management:    Induction:   PONV Risk Score and Plan: 1 and TIVA, Treatment may vary due to age or medical condition and Midazolam  Airway Management Planned: Natural Airway and Nasal Cannula  Additional Equipment:   Intra-op Plan:   Post-operative Plan:   Informed Consent: I have reviewed the patients History and Physical, chart, labs and discussed the procedure including the risks, benefits and alternatives for the proposed anesthesia with the patient or authorized representative who has indicated his/her understanding and acceptance.     Dental advisory given  Plan Discussed with: CRNA  Anesthesia Plan Comments:         Anesthesia Quick Evaluation

## 2021-04-20 NOTE — H&P (Signed)
[facility]   Primary Care Physician:  Birdie Sons, MD Ophthalmologist: Dr. Benay Pillow  Pre-Procedure History & Physical: HPI:  Garrett Woods is a 65 y.o. male here for cataract surgery.   Past Medical History:  Diagnosis Date   Arthritis    Atrial fibrillation (HCC)    Elevated PSA    Foot drop, right    since spianl fusion 8/17   Gout    Heart murmur    History of chicken pox    Hyperlipemia    Hypertension    Essential   Microscopic hematuria    MRSA (methicillin resistant staph aureus) culture positive    Nasal culture. Prior to Back surgery. Used "ointment"   Myasthenia gravis [facility])    Pacemaker    medtronic [id number] Advisa   Prostate cancer [facility])    Radiation    Past Surgical History:  Procedure Laterality Date   BACK SURGERY  06/2016   lumbar fusion   BASAL CELL CARCINOMA EXCISION  12/28/2012   Anterior neck, done by Dr. Lacinda Axon at [place] Left    COLONOSCOPY W/ POLYPECTOMY     COLONOSCOPY WITH PROPOFOL N/A 02/07/2017   Procedure: COLONOSCOPY WITH PROPOFOL;  Surgeon: Lucilla Lame, MD;  Location: [place];  Service: Endoscopy;  Laterality: N/A;   INSERT / REPLACE / REMOVE PACEMAKER  12/24/2014   Medtronic   KNEE SURGERY Right 1988   Tear of LCL of knee   MELANOMA EXCISION  2013,2015   X 2; Neck & Back   TOTAL KNEE ARTHROPLASTY Right 10/05/2016   Procedure: TOTAL KNEE ARTHROPLASTY;  Surgeon: Hessie Knows, MD;  Location: [facility];  Service: Orthopedics;  Laterality: Right;  +MRSA PCR     Prior to Admission medications   Medication Sig Start Date End Date Taking? Authorizing Provider  acetaminophen (TYLENOL) 500 MG tablet Take 1,000 mg by mouth every 6 (six) hours as needed for mild pain.   Yes [provider]  antiseptic oral rinse (BIOTENE) LIQD 15 mLs by Mouth Rinse route as needed for dry mouth.   Yes [provider]  apixaban (ELIQUIS) 5 MG TABS tablet Take by mouth. 03/28/19  Yes [provider]  diphenhydramine-acetaminophen (TYLENOL PM) 25-500 MG TABS tablet Take 1 tablet by mouth at bedtime as needed.   Yes [provider]  fluorouracil (EFUDEX) 5 % cream Apply topically daily as needed.   Yes [provider]  lovastatin (MEVACOR) 40 MG tablet TAKE 1 TABLET BY MOUTH EVERY DAY 12/08/20  Yes Birdie Sons, MD  oxymetazoline (AFRIN) 0.05 % nasal spray Place 1 spray into both nostrils 2 (two) times daily as needed for congestion.   Yes [provider]  valsartan-hydrochlorothiazide (DIOVAN HCT) 160-25 MG tablet Take 1 tablet by mouth daily. 12/26/20  Yes Birdie Sons, MD  tamsulosin (FLOMAX) 0.4 MG CAPS capsule TAKE 2 CAPSULES BY MOUTH EVERY DAY Patient not taking: Reported on 04/08/2021 11/13/20   Birdie Sons, MD    Allergies as of 03/05/2021   (No Known Allergies)    Family History  Problem Relation Age of Onset   Heart failure Father    Heart attack Father    Hypertension Mother    Heart Problems Mother    Heart disease Other    Hyperthyroidism Other    Diabetes Other    Cancer Neg Hx     Social History   Socioeconomic History   Marital status: Married  Spouse name: Not on file   Number of children: Not on file   Years of education: Not on file   Highest education level: Not on file  Occupational History   Occupation: [employer]    Comment: VP dinning services  Tobacco Use   Smoking status: Former    Packs/day: 0.00    Years: 20.00    Pack years: 0.00    Types: Cigarettes    Quit date: 11/02/2003    Years since quitting: 17.4   Smokeless tobacco: Former  Scientific laboratory technician Use: Never used  Substance and Sexual Activity   Alcohol use: Yes    Alcohol/week: 0.0 standard drinks    Comment: 2 drinks every weekend   Drug use: No   Sexual activity: Not on file  Other Topics Concern   Not on file  Social History Narrative   Not on file   Social Determinants of Health   Financial Resource Strain: Not on file   Food Insecurity: Not on file  Transportation Needs: Not on file  Physical Activity: Not on file  Stress: Not on file  Social Connections: Not on file  Intimate Partner Violence: Not on file    Review of Systems: See HPI, otherwise negative ROS  Physical Exam: BP (!) 174/94   Pulse 70   Temp (!) 97 F (36.1 C) (Temporal)   Resp 16   Ht 6\' 1"  (1.854 m)   Wt (!) 171 kg   SpO2 98%   BMI 49.74 kg/m  General:   Alert,  pleasant and cooperative in NAD Head:  Normocephalic and atraumatic. Respiratory:  Normal work of breathing. Cardiovascular:  RRR  Impression/Plan: Garrett Woods is here for cataract surgery.  Risks, benefits, limitations, and alternatives regarding cataract surgery have been reviewed with the patient.  Questions have been answered.  All parties agreeable.   Benay Pillow, MD  04/20/2021, 10:44 AM

## 2021-04-20 NOTE — Anesthesia Procedure Notes (Signed)
Procedure Name: MAC Date/Time: 04/20/2021 10:54 AM Performed by: Cameron Ali, CRNA Pre-anesthesia Checklist: Patient identified, Emergency Drugs available, Suction available, Timeout performed and Patient being monitored Patient Re-evaluated:Patient Re-evaluated prior to induction Oxygen Delivery Method: Nasal cannula Placement Confirmation: positive ETCO2

## 2021-04-21 ENCOUNTER — Encounter: Payer: Self-pay | Admitting: Ophthalmology

## 2021-04-29 ENCOUNTER — Encounter: Payer: Self-pay | Admitting: Ophthalmology

## 2021-05-06 DIAGNOSIS — H2512 Age-related nuclear cataract, left eye: Secondary | ICD-10-CM | POA: Diagnosis not present

## 2021-05-11 ENCOUNTER — Encounter
Admission: RE | Disposition: A | Discharge status: Home / Self Care | Payer: Self-pay | Source: Home / Self Care | Attending: Ophthalmology

## 2021-05-11 ENCOUNTER — Ambulatory Visit
Admission: RE | Admit: 2021-05-11 | Discharge: 2021-05-11 | Disposition: A | Discharge status: Home / Self Care | Payer: Medicare Other | Attending: Ophthalmology | Admitting: Ophthalmology

## 2021-05-11 ENCOUNTER — Other Ambulatory Visit: Payer: Self-pay

## 2021-05-11 ENCOUNTER — Ambulatory Visit: Payer: Medicare Other | Admitting: Anesthesiology

## 2021-05-11 ENCOUNTER — Encounter: Payer: Self-pay | Admitting: Ophthalmology

## 2021-05-11 DIAGNOSIS — Z96651 Presence of right artificial knee joint: Secondary | ICD-10-CM | POA: Insufficient documentation

## 2021-05-11 DIAGNOSIS — Z9841 Cataract extraction status, right eye: Secondary | ICD-10-CM | POA: Diagnosis not present

## 2021-05-11 DIAGNOSIS — Z8546 Personal history of malignant neoplasm of prostate: Secondary | ICD-10-CM | POA: Diagnosis not present

## 2021-05-11 DIAGNOSIS — H25812 Combined forms of age-related cataract, left eye: Secondary | ICD-10-CM | POA: Diagnosis not present

## 2021-05-11 DIAGNOSIS — Z950 Presence of cardiac pacemaker: Secondary | ICD-10-CM | POA: Diagnosis not present

## 2021-05-11 DIAGNOSIS — Z961 Presence of intraocular lens: Secondary | ICD-10-CM | POA: Insufficient documentation

## 2021-05-11 DIAGNOSIS — Z87891 Personal history of nicotine dependence: Secondary | ICD-10-CM | POA: Diagnosis not present

## 2021-05-11 DIAGNOSIS — Z79899 Other long term (current) drug therapy: Secondary | ICD-10-CM | POA: Insufficient documentation

## 2021-05-11 DIAGNOSIS — Z7901 Long term (current) use of anticoagulants: Secondary | ICD-10-CM | POA: Insufficient documentation

## 2021-05-11 DIAGNOSIS — Z85820 Personal history of malignant melanoma of skin: Secondary | ICD-10-CM | POA: Diagnosis not present

## 2021-05-11 DIAGNOSIS — H2512 Age-related nuclear cataract, left eye: Secondary | ICD-10-CM | POA: Diagnosis not present

## 2021-05-11 HISTORY — PX: CATARACT EXTRACTION W/PHACO: SHX586

## 2021-05-11 SURGERY — PHACOEMULSIFICATION, CATARACT, WITH IOL INSERTION
Anesthesia: Monitor Anesthesia Care | Site: Eye | Laterality: Left

## 2021-05-11 MED ORDER — MOXIFLOXACIN HCL 0.5 % OP SOLN
OPHTHALMIC | Status: DC | PRN
  Administered 2021-05-11: 0.2 mL via OPHTHALMIC

## 2021-05-11 MED ORDER — SIGHTPATH DOSE#1 SODIUM HYALURONATE 23 MG/ML IO SOLUTION
PREFILLED_SYRINGE | INTRAOCULAR | Status: DC | PRN
  Administered 2021-05-11: .6 mL via INTRAOCULAR

## 2021-05-11 MED ORDER — SIGHTPATH DOSE#1 SODIUM HYALURONATE 10 MG/ML IO SOLUTION
PREFILLED_SYRINGE | INTRAOCULAR | Status: DC | PRN
  Administered 2021-05-11: 0.55 mL via INTRAOCULAR

## 2021-05-11 MED ORDER — MIDAZOLAM HCL 2 MG/2ML IJ SOLN
INTRAMUSCULAR | Status: DC | PRN
  Administered 2021-05-11 (×2): 1 mg via INTRAVENOUS

## 2021-05-11 MED ORDER — FENTANYL CITRATE (PF) 100 MCG/2ML IJ SOLN
INTRAMUSCULAR | Status: DC | PRN
  Administered 2021-05-11 (×2): 50 ug via INTRAVENOUS

## 2021-05-11 MED ORDER — PHENYLEPHRINE HCL 10 % OP SOLN
1.0000 [drp] | OPHTHALMIC | Status: AC
  Administered 2021-05-11 (×3): 1 [drp] via OPHTHALMIC

## 2021-05-11 MED ORDER — SIGHTPATH DOSE#1 BSS IO SOLN
INTRAOCULAR | Status: DC | PRN
  Administered 2021-05-11: 79 mL via OPHTHALMIC

## 2021-05-11 MED ORDER — LIDOCAINE HCL (PF) 2 % IJ SOLN
INTRAOCULAR | Status: DC | PRN
  Administered 2021-05-11: 1 mL via INTRAOCULAR

## 2021-05-11 MED ORDER — LACTATED RINGERS IV SOLN: INTRAVENOUS | Status: DC

## 2021-05-11 MED ORDER — TETRACAINE HCL 0.5 % OP SOLN
1.0000 [drp] | OPHTHALMIC | Status: DC | PRN
  Administered 2021-05-11 (×3): 1 [drp] via OPHTHALMIC

## 2021-05-11 MED ORDER — CYCLOPENTOLATE HCL 2 % OP SOLN
1.0000 [drp] | OPHTHALMIC | Status: AC
  Administered 2021-05-11 (×3): 1 [drp] via OPHTHALMIC

## 2021-05-11 SURGICAL SUPPLY — 15 items
CANNULA ANT/CHMB 27GA (MISCELLANEOUS) ×4 IMPLANT
DISSECTOR HYDRO NUCLEUS 50X22 (MISCELLANEOUS) ×2 IMPLANT
GLOVE SURG ENC TEXT LTX SZ7.5 (GLOVE) ×2 IMPLANT
GLOVE SURG GAMMEX PI TX LF 7.5 (GLOVE) IMPLANT
GLOVE SURG SYN 8.5  E (GLOVE) ×1
GLOVE SURG SYN 8.5 E (GLOVE) ×1 IMPLANT
GOWN STRL REUS W/ TWL LRG LVL3 (GOWN DISPOSABLE) ×2 IMPLANT
GOWN STRL REUS W/TWL LRG LVL3 (GOWN DISPOSABLE) ×4
LENS IOL TECNIS EYHANCE 20.0 (Intraocular Lens) ×2 IMPLANT
MARKER SKIN DUAL TIP RULER LAB (MISCELLANEOUS) ×2 IMPLANT
PACK EYE AFTER SURG (MISCELLANEOUS) ×2 IMPLANT
SYR 3ML LL SCALE MARK (SYRINGE) ×2 IMPLANT
SYR TB 1ML LUER SLIP (SYRINGE) ×2 IMPLANT
WATER STERILE IRR 250ML POUR (IV SOLUTION) ×2 IMPLANT
WIPE NON LINTING 3.25X3.25 (MISCELLANEOUS) ×2 IMPLANT

## 2021-05-11 NOTE — Anesthesia Preprocedure Evaluation (Addendum)
Anesthesia Evaluation  Patient identified by MRN, date of birth, ID band Patient awake    Reviewed: Allergy & Precautions, NPO status , Patient's Chart, lab work & pertinent test results  History of Anesthesia Complications Negative for: history of anesthetic complications  Airway Mallampati: II   Neck ROM: Full    Dental no notable dental hx.    Pulmonary former smoker (quit 2005),    Pulmonary exam normal breath sounds clear to auscultation       Cardiovascular hypertension, Normal cardiovascular exam+ dysrhythmias (a fib on Eliquis) + pacemaker (complete AV block) + Valvular Problems/Murmurs (mild) AS  Rhythm:Regular Rate:Normal     Neuro/Psych  Neuromuscular disease (myasthenia gravis)    GI/Hepatic negative GI ROS,   Endo/Other  Class 3 obesity  Renal/GU negative Renal ROS     Musculoskeletal  (+) Arthritis ,   Abdominal   Peds  Hematology Prostate CA   Anesthesia Other Findings   Reproductive/Obstetrics                            Anesthesia Physical Anesthesia Plan  ASA: 4  Anesthesia Plan: MAC   Post-op Pain Management:    Induction: Intravenous  PONV Risk Score and Plan: 1 and TIVA, Midazolam and Treatment may vary due to age or medical condition  Airway Management Planned: Nasal Cannula  Additional Equipment:   Intra-op Plan:   Post-operative Plan:   Informed Consent: I have reviewed the patients History and Physical, chart, labs and discussed the procedure including the risks, benefits and alternatives for the proposed anesthesia with the patient or authorized representative who has indicated his/her understanding and acceptance.       Plan Discussed with: CRNA  Anesthesia Plan Comments:        Anesthesia Quick Evaluation

## 2021-05-11 NOTE — Op Note (Signed)
OPERATIVE NOTE  Garrett Woods [id number] 05/11/2021   PREOPERATIVE DIAGNOSIS:  Nuclear sclerotic cataract left eye.  H25.12   POSTOPERATIVE DIAGNOSIS:    Nuclear sclerotic cataract left eye.     PROCEDURE:  Phacoemusification with posterior chamber intraocular lens placement of the left eye   LENS:   Implant Name Type Inv. Item Serial No. Manufacturer Lot No. LRB No. Used Action  LENS IOL TECNIS EYHANCE 20.0 - [id number] Intraocular Lens LENS IOL TECNIS EYHANCE 20.0 [id number] [id number]   Left 1 Implanted      Procedure(s): CATARACT EXTRACTION PHACO AND INTRAOCULAR LENS PLACEMENT (IOC) LEFT 2.45 00:22.5 (Left)  DIB00 +20.0   ULTRASOUND TIME: 0 minutes 22 seconds.  CDE 2.45   SURGEON:  Benay Pillow, MD, MPH   ANESTHESIA:  Topical with tetracaine drops augmented with 1% preservative-free intracameral lidocaine.  ESTIMATED BLOOD LOSS: <1 mL   COMPLICATIONS:  None.   DESCRIPTION OF PROCEDURE:  The patient was identified in the holding room and transported to the operating room and placed in the supine position under the operating microscope.  The left eye was identified as the operative eye and it was prepped and draped in the usual sterile ophthalmic fashion.   A 1.0 millimeter clear-corneal paracentesis was made at the 5:00 position. 0.5 ml of preservative-free 1% lidocaine with epinephrine was injected into the anterior chamber.  The anterior chamber was filled with Healon 5 viscoelastic.  A 2.4 millimeter keratome was used to make a near-clear corneal incision at the 2:00 position.  A curvilinear capsulorrhexis was made with a cystotome and capsulorrhexis forceps.  Balanced salt solution was used to hydrodissect and hydrodelineate the nucleus.   Phacoemulsification was then used in stop and chop fashion to remove the lens nucleus and epinucleus.  The remaining cortex was then removed using the irrigation and aspiration handpiece. Healon was then placed into the capsular bag to  distend it for lens placement.  A lens was then injected into the capsular bag.  The remaining viscoelastic was aspirated.   Wounds were hydrated with balanced salt solution.  The anterior chamber was inflated to a physiologic pressure with balanced salt solution.  Intracameral vigamox 0.1 mL undiltued was injected into the eye and a drop placed onto the ocular surface.  No wound leaks were noted.  The patient was taken to the recovery room in stable condition without complications of anesthesia or surgery  Benay Pillow 05/11/2021, 11:58 AM

## 2021-05-11 NOTE — Transfer of Care (Signed)
Immediate Anesthesia Transfer of Care Note  Patient: Garrett Woods  Procedure(s) Performed: CATARACT EXTRACTION PHACO AND INTRAOCULAR LENS PLACEMENT (IOC) LEFT 2.45 00:22.5 (Left: Eye)  Patient Location: PACU  Anesthesia Type: MAC  Level of Consciousness: awake, alert  and patient cooperative  Airway and Oxygen Therapy: Patient Spontanous Breathing and Patient connected to supplemental oxygen  Post-op Assessment: Post-op Vital signs reviewed, Patient's Cardiovascular Status Stable, Respiratory Function Stable, Patent Airway and No signs of Nausea or vomiting  Post-op Vital Signs: Reviewed and stable  Complications: No notable events documented.

## 2021-05-11 NOTE — Anesthesia Postprocedure Evaluation (Signed)
Anesthesia Post Note  Patient: Garrett Woods  Procedure(s) Performed: CATARACT EXTRACTION PHACO AND INTRAOCULAR LENS PLACEMENT (IOC) LEFT 2.45 00:22.5 (Left: Eye)     Patient location during evaluation: PACU Anesthesia Type: MAC Level of consciousness: awake and alert, oriented and patient cooperative Pain management: pain level controlled Vital Signs Assessment: post-procedure vital signs reviewed and stable Respiratory status: spontaneous breathing, nonlabored ventilation and respiratory function stable Cardiovascular status: blood pressure returned to baseline and stable Postop Assessment: adequate PO intake Anesthetic complications: no   No notable events documented.  Darrin Nipper

## 2021-05-11 NOTE — H&P (Signed)
Garrett Woods   Primary Care Physician:  Birdie Sons, MD Ophthalmologist: Dr. Benay Pillow  Pre-Procedure History & Physical: HPI:  Garrett Woods is a 65 y.o. male here for cataract surgery.   Past Medical History:  Diagnosis Date   Arthritis    Atrial fibrillation (HCC)    Elevated PSA    Foot drop, right    since spianl fusion 8/17   Gout    Heart murmur    History of chicken pox    Hyperlipemia    Hypertension    Essential   Microscopic hematuria    MRSA (methicillin resistant staph aureus) culture positive    Nasal culture. Prior to Back surgery. Used "ointment"   Myasthenia gravis [facility])    Pacemaker    medtronic [id number] Advisa   Prostate cancer [facility])    Radiation    Past Surgical History:  Procedure Laterality Date   BACK SURGERY  06/2016   lumbar fusion   BASAL CELL CARCINOMA EXCISION  12/28/2012   Anterior neck, done by Dr. Lacinda Axon at [place] Left    CATARACT EXTRACTION W/PHACO Right 04/20/2021   Procedure: CATARACT EXTRACTION PHACO AND INTRAOCULAR LENS PLACEMENT ([place]) RIGHT;  Surgeon: Eulogio Bear, MD;  Location: [place];  Service: Ophthalmology;  Laterality: Right;  3.30 0:25.1   COLONOSCOPY W/ POLYPECTOMY     COLONOSCOPY WITH PROPOFOL N/A 02/07/2017   Procedure: COLONOSCOPY WITH PROPOFOL;  Surgeon: Lucilla Lame, MD;  Location: [place];  Service: Endoscopy;  Laterality: N/A;   INSERT / REPLACE / REMOVE PACEMAKER  12/24/2014   Medtronic   KNEE SURGERY Right 1988   Tear of LCL of knee   MELANOMA EXCISION  2013,2015   X 2; Neck & Back   TOTAL KNEE ARTHROPLASTY Right 10/05/2016   Procedure: TOTAL KNEE ARTHROPLASTY;  Surgeon: Hessie Knows, MD;  Location: [facility];  Service: Orthopedics;  Laterality: Right;  +MRSA PCR     Prior to Admission medications   Medication Sig Start Date End Date Taking? Authorizing Provider  acetaminophen (TYLENOL) 500 MG tablet Take 1,000 mg by mouth every 6 (six) hours as  needed for mild pain.   Yes [provider]  apixaban (ELIQUIS) 5 MG TABS tablet Take by mouth. 03/28/19  Yes [provider]  lovastatin (MEVACOR) 40 MG tablet TAKE 1 TABLET BY MOUTH EVERY DAY 12/08/20  Yes Birdie Sons, MD  oxymetazoline (AFRIN) 0.05 % nasal spray Place 1 spray into both nostrils 2 (two) times daily as needed for congestion.   Yes [provider]  valsartan-hydrochlorothiazide (DIOVAN HCT) 160-25 MG tablet Take 1 tablet by mouth daily. 12/26/20  Yes Birdie Sons, MD  antiseptic oral rinse (BIOTENE) LIQD 15 mLs by Mouth Rinse route as needed for dry mouth.    [provider]  diphenhydramine-acetaminophen (TYLENOL PM) 25-500 MG TABS tablet Take 1 tablet by mouth at bedtime as needed.    [provider]  fluorouracil (EFUDEX) 5 % cream Apply topically daily as needed.    [provider]  tamsulosin (FLOMAX) 0.4 MG CAPS capsule TAKE 2 CAPSULES BY MOUTH EVERY DAY Patient not taking: Reported on 04/08/2021 11/13/20   Birdie Sons, MD    Allergies as of 03/05/2021   (No Known Allergies)    Family History  Problem Relation Age of Onset   Heart failure Father    Heart attack Father    Hypertension Mother    Heart Problems  Mother    Heart disease Other    Hyperthyroidism Other    Diabetes Other    Cancer Neg Hx     Social History   Socioeconomic History   Marital status: Married    Spouse name: Not on file   Number of children: Not on file   Years of education: Not on file   Highest education level: Not on file  Occupational History   Occupation: [employer]    Comment: VP dinning services  Tobacco Use   Smoking status: Former    Packs/day: 0.00    Years: 20.00    Pack years: 0.00    Types: Cigarettes    Quit date: 11/02/2003    Years since quitting: 17.5   Smokeless tobacco: Former  Scientific laboratory technician Use: Never used  Substance and Sexual Activity   Alcohol use: Yes    Alcohol/week: 0.0 standard  drinks    Comment: 2 drinks every weekend   Drug use: No   Sexual activity: Not on file  Other Topics Concern   Not on file  Social History Narrative   Not on file   Social Determinants of Health   Financial Resource Strain: Not on file  Food Insecurity: Not on file  Transportation Needs: Not on file  Physical Activity: Not on file  Stress: Not on file  Social Connections: Not on file  Intimate Partner Violence: Not on file    Review of Systems: See HPI, otherwise negative ROS  Physical Exam: BP (!) 156/97   Pulse 85   Temp (!) 97.3 F (36.3 C) (Temporal)   Resp 18   Wt (!) 174.2 kg   SpO2 97%   BMI 50.66 kg/m  General:   Alert, cooperative in NAD Head:  Normocephalic and atraumatic. Respiratory:  Normal work of breathing. Cardiovascular:  RRR  Impression/Plan: Garrett Woods is here for cataract surgery.  Risks, benefits, limitations, and alternatives regarding cataract surgery have been reviewed with the patient.  Questions have been answered.  All parties agreeable.   Benay Pillow, MD  05/11/2021, 11:28 AM

## 2021-05-11 NOTE — Anesthesia Procedure Notes (Signed)
Procedure Name: MAC Date/Time: 05/11/2021 11:40 AM Performed by: Jeannene Patella, CRNA Pre-anesthesia Checklist: Patient identified, Emergency Drugs available, Suction available, Timeout performed and Patient being monitored Patient Re-evaluated:Patient Re-evaluated prior to induction Oxygen Delivery Method: Nasal cannula Placement Confirmation: positive ETCO2

## 2021-05-14 ENCOUNTER — Encounter: Payer: Self-pay | Admitting: Ophthalmology

## 2021-06-04 ENCOUNTER — Other Ambulatory Visit: Payer: Self-pay | Admitting: Family Medicine

## 2021-06-04 NOTE — Telephone Encounter (Signed)
Requested Prescriptions  Pending Prescriptions Disp Refills  . lovastatin (MEVACOR) 40 MG tablet [Pharmacy Med Name: LOVASTATIN 40 MG TABLET] 90 tablet 1    Sig: TAKE 1 TABLET BY MOUTH EVERY DAY     Cardiovascular:  Antilipid - Statins Failed - 06/04/2021  1:43 AM      Failed - LDL in normal range and within 360 days    LDL Chol Calc (NIH)  Date Value Ref Range Status  08/26/2020 104 (H) 0 - 99 mg/dL Final         Failed - HDL in normal range and within 360 days    HDL  Date Value Ref Range Status  08/26/2020 37 (L) >39 mg/dL Final         Passed - Total Cholesterol in normal range and within 360 days    Cholesterol, Total  Date Value Ref Range Status  08/26/2020 159 100 - 199 mg/dL Final         Passed - Triglycerides in normal range and within 360 days    Triglycerides  Date Value Ref Range Status  08/26/2020 99 0 - 149 mg/dL Final         Passed - Patient is not pregnant      Passed - Valid encounter within last 12 months    Recent Outpatient Visits          7 months ago Annual physical exam   [facility] Birdie Sons, MD   1 year ago Urinary frequency   [facility] Birdie Sons, MD   1 year ago Upper respiratory tract infection, unspecified type   [facility], Dionne Bucy, MD   2 years ago Subacute pansinusitis   [place], Vickki Muff, PA-C   2 years ago Need for shingles vaccine   [facility] Birdie Sons, MD

## 2021-06-11 ENCOUNTER — Other Ambulatory Visit: Payer: Self-pay | Admitting: Family Medicine

## 2021-06-11 DIAGNOSIS — I10 Essential (primary) hypertension: Secondary | ICD-10-CM

## 2021-06-11 NOTE — Telephone Encounter (Signed)
Attempted to call patient to schedule follow up visit- left message to call office for appointment. Courtesy #30 day supply sent to pharamcy

## 2021-06-19 DIAGNOSIS — I4819 Other persistent atrial fibrillation: Secondary | ICD-10-CM | POA: Diagnosis not present

## 2021-06-19 DIAGNOSIS — E782 Mixed hyperlipidemia: Secondary | ICD-10-CM | POA: Diagnosis not present

## 2021-06-19 DIAGNOSIS — I442 Atrioventricular block, complete: Secondary | ICD-10-CM | POA: Diagnosis not present

## 2021-06-19 DIAGNOSIS — I10 Essential (primary) hypertension: Secondary | ICD-10-CM | POA: Diagnosis not present

## 2021-06-19 DIAGNOSIS — Z950 Presence of cardiac pacemaker: Secondary | ICD-10-CM | POA: Diagnosis not present

## 2021-06-19 DIAGNOSIS — I350 Nonrheumatic aortic (valve) stenosis: Secondary | ICD-10-CM | POA: Diagnosis not present

## 2021-07-04 ENCOUNTER — Other Ambulatory Visit: Payer: Self-pay | Admitting: Family Medicine

## 2021-07-04 DIAGNOSIS — I10 Essential (primary) hypertension: Secondary | ICD-10-CM

## 2021-07-04 NOTE — Telephone Encounter (Signed)
Requested medication (s) are due for refill today: yes  Requested medication (s) are on the active medication list: yes  Last refill:  06/11/21 #30  Future visit scheduled: yes  Notes to clinic:  called and LM on VM to call back to schedule appt for appt/labs   Requested Prescriptions  Pending Prescriptions Disp Refills   valsartan-hydrochlorothiazide (DIOVAN-HCT) 160-25 MG tablet [Pharmacy Med Name: VALSARTAN-HCTZ 160-25 MG TAB] 30 tablet 0    Sig: TAKE 1 TABLET BY MOUTH EVERY DAY     Cardiovascular: ARB + Diuretic Combos Failed - 07/04/2021 10:32 AM      Failed - K in normal range and within 180 days    Potassium  Date Value Ref Range Status  08/26/2020 4.4 3.5 - 5.2 mmol/L Final          Failed - Na in normal range and within 180 days    Sodium  Date Value Ref Range Status  08/26/2020 138 134 - 144 mmol/L Final          Failed - Cr in normal range and within 180 days    Creatinine, Ser  Date Value Ref Range Status  08/26/2020 1.08 0.76 - 1.27 mg/dL Final          Failed - Ca in normal range and within 180 days    Calcium  Date Value Ref Range Status  08/26/2020 9.4 8.6 - 10.2 mg/dL Final          Failed - Valid encounter within last 6 months    Recent Outpatient Visits           8 months ago Annual physical exam   [facility] Birdie Sons, MD   1 year ago Urinary frequency   [facility] Birdie Sons, MD   1 year ago Upper respiratory tract infection, unspecified type   [facility], Dionne Bucy, MD   2 years ago Subacute pansinusitis   [place], Vickki Muff, PA-C   2 years ago Need for shingles vaccine   [facility] Birdie Sons, MD       Future Appointments             In 3 weeks Caryn Section, Kirstie Peri, MD [facility] [place] - Patient is not pregnant      Passed - Last BP in normal range    BP Readings from Last 1  Encounters:  05/11/21 104/76

## 2021-07-14 DIAGNOSIS — I442 Atrioventricular block, complete: Secondary | ICD-10-CM | POA: Diagnosis not present

## 2021-07-17 DIAGNOSIS — I350 Nonrheumatic aortic (valve) stenosis: Secondary | ICD-10-CM | POA: Diagnosis not present

## 2021-07-17 DIAGNOSIS — I442 Atrioventricular block, complete: Secondary | ICD-10-CM | POA: Diagnosis not present

## 2021-07-17 DIAGNOSIS — E782 Mixed hyperlipidemia: Secondary | ICD-10-CM | POA: Diagnosis not present

## 2021-07-17 DIAGNOSIS — Z950 Presence of cardiac pacemaker: Secondary | ICD-10-CM | POA: Diagnosis not present

## 2021-07-17 DIAGNOSIS — I4819 Other persistent atrial fibrillation: Secondary | ICD-10-CM | POA: Diagnosis not present

## 2021-07-17 DIAGNOSIS — J449 Chronic obstructive pulmonary disease, unspecified: Secondary | ICD-10-CM | POA: Diagnosis not present

## 2021-07-17 DIAGNOSIS — I10 Essential (primary) hypertension: Secondary | ICD-10-CM | POA: Diagnosis not present

## 2021-07-27 ENCOUNTER — Ambulatory Visit: Payer: Medicare Other | Admitting: Family Medicine

## 2021-08-04 ENCOUNTER — Ambulatory Visit
Admission: RE | Admit: 2021-08-04 | Discharge: 2021-08-04 | Disposition: A | Discharge status: Home / Self Care | Payer: Medicare Other | Attending: Cardiology | Admitting: Cardiology

## 2021-08-04 ENCOUNTER — Encounter: Payer: Self-pay | Admitting: Cardiology

## 2021-08-04 ENCOUNTER — Encounter
Admission: RE | Disposition: A | Discharge status: Home / Self Care | Payer: Self-pay | Source: Home / Self Care | Attending: Cardiology

## 2021-08-04 ENCOUNTER — Other Ambulatory Visit: Payer: Self-pay

## 2021-08-04 DIAGNOSIS — Z45018 Encounter for adjustment and management of other part of cardiac pacemaker: Secondary | ICD-10-CM

## 2021-08-04 DIAGNOSIS — Z45010 Encounter for checking and testing of cardiac pacemaker pulse generator [battery]: Secondary | ICD-10-CM | POA: Insufficient documentation

## 2021-08-04 DIAGNOSIS — I442 Atrioventricular block, complete: Secondary | ICD-10-CM | POA: Diagnosis not present

## 2021-08-04 HISTORY — PX: PPM GENERATOR CHANGEOUT: EP1233

## 2021-08-04 SURGERY — PPM GENERATOR CHANGEOUT
Anesthesia: Moderate Sedation

## 2021-08-04 MED ORDER — LIDOCAINE HCL 1 % IJ SOLN
INTRAMUSCULAR | Status: AC
  Filled 2021-08-04: qty 20

## 2021-08-04 MED ORDER — MIDAZOLAM HCL 2 MG/2ML IJ SOLN
INTRAMUSCULAR | Status: DC | PRN
  Administered 2021-08-04: 1 mg via INTRAVENOUS

## 2021-08-04 MED ORDER — SODIUM CHLORIDE 0.9 % IV SOLN
80.0000 mg | INTRAVENOUS | Status: AC
  Administered 2021-08-04: 80 mg
  Filled 2021-08-04: qty 80

## 2021-08-04 MED ORDER — ONDANSETRON HCL 4 MG/2ML IJ SOLN: 4.0000 mg | Freq: Four times a day (QID) | INTRAMUSCULAR | Status: DC | PRN

## 2021-08-04 MED ORDER — MIDAZOLAM HCL 2 MG/2ML IJ SOLN
INTRAMUSCULAR | Status: AC
  Filled 2021-08-04: qty 2

## 2021-08-04 MED ORDER — CLARITHROMYCIN ER 500 MG PO TB24: 1000.0000 mg | ORAL_TABLET | Freq: Every day | ORAL | 0 refills | Status: AC

## 2021-08-04 MED ORDER — FENTANYL CITRATE (PF) 100 MCG/2ML IJ SOLN
INTRAMUSCULAR | Status: AC
  Filled 2021-08-04: qty 2

## 2021-08-04 MED ORDER — SODIUM CHLORIDE 0.9 % IV SOLN
INTRAVENOUS | Status: DC
  Administered 2021-08-04: 1000 mL via INTRAVENOUS

## 2021-08-04 MED ORDER — FENTANYL CITRATE (PF) 100 MCG/2ML IJ SOLN
INTRAMUSCULAR | Status: DC | PRN
  Administered 2021-08-04: 25 ug via INTRAVENOUS
  Administered 2021-08-04: 50 ug via INTRAVENOUS

## 2021-08-04 MED ORDER — ACETAMINOPHEN 325 MG PO TABS: 325.0000 mg | ORAL_TABLET | ORAL | Status: DC | PRN

## 2021-08-04 MED ORDER — VANCOMYCIN HCL IN DEXTROSE 1-5 GM/200ML-% IV SOLN
1000.0000 mg | INTRAVENOUS | Status: AC
  Filled 2021-08-04: qty 200

## 2021-08-04 MED ORDER — LIDOCAINE HCL (PF) 1 % IJ SOLN
INTRAMUSCULAR | Status: DC | PRN
  Administered 2021-08-04: 30 mL

## 2021-08-04 MED ORDER — VANCOMYCIN HCL 500 MG/100ML IV SOLN
INTRAVENOUS | Status: DC | PRN
  Administered 2021-08-04: 1000 mg via INTRAVENOUS

## 2021-08-04 SURGICAL SUPPLY — 14 items
CABLE SURG 12 DISP A/V CHANNEL (MISCELLANEOUS) ×1 IMPLANT
DEVICE DSSCT PLSMBLD 3.0S LGHT (MISCELLANEOUS) IMPLANT
ELECT REM PT RETURN 9FT ADLT (ELECTROSURGICAL) ×2
ELECTRODE REM PT RTRN 9FT ADLT (ELECTROSURGICAL) IMPLANT
IPG PACE AZUR XT DR MRI W1DR01 (Pacemaker) IMPLANT
PACE AZURE XT DR MRI W1DR01 (Pacemaker) ×2 IMPLANT
PAD ELECT DEFIB RADIOL ZOLL (MISCELLANEOUS) ×1 IMPLANT
PLASMABLADE 3.0S W/LIGHT (MISCELLANEOUS) ×2
SUT VIC AB 2-0 CT1 27 (SUTURE) ×1
SUT VIC AB 2-0 CT1 TAPERPNT 27 (SUTURE) IMPLANT
SUT VICRYL 4-0  27 PS-2 BARIAT (SUTURE) ×1
SUT VICRYL 4-0 27 PS-2 BARIAT (SUTURE) ×1
SUTURE VICRYL 4-0 27 PS-2 BART (SUTURE) IMPLANT
TRAY PACEMAKER INSERTION (PACKS) ×2 IMPLANT

## 2021-08-04 NOTE — Discharge Instructions (Addendum)
Patient may shower 08/06/2021.  May remove outer bandage on 08/06/2021, leave Steri-Strips on.  Resume Eliquis on 08/05/2021.  Take Biaxin 500 mg twice daily for 7 days.  Hold lovastatin while taking, Biaxin resume after completing Biaxin.Implantable Cardiac Device Battery Change, Care After  This sheet gives you information about how to care for yourself after your procedure. Your health care provider may also give you more specific instructions. If you have problems or questions, contact your health care provider. What can I expect after the procedure? After your procedure, it is common to have: Pain or soreness at the site where the cardiac device was inserted. Swelling at the site where the cardiac device was inserted. You should received an information card for your new device in 4-8 weeks. Follow these instructions at home: Incision care  Keep the incision clean and dry. Do not take baths, swim, or use a hot tub until after your wound check.  Do not shower for at least 7 days, or as directed by your health care provider. Pat the area dry with a clean towel. Do not rub the area. This may cause bleeding. Follow instructions from your health care provider about how to take care of your incision. Make sure you: Leave stitches (sutures), skin glue, or adhesive strips in place. These skin closures may need to stay in place for 2 weeks or longer. If adhesive strip edges start to loosen and curl up, you may trim the loose edges. Do not remove adhesive strips completely unless your health care provider tells you to do that. Check your incision area every day for signs of infection. Check for: More redness, swelling, or pain. More fluid or blood. Warmth. Pus or a bad smell. Activity Do not lift anything that is heavier than 10 lb (4.5 kg) until your health care provider says it is okay to do so. For the first week, or as long as told by your health care provider: Avoid lifting your affected arm  higher than your shoulder. After 1 week, Be gentle when you move your arms over your head. It is okay to raise your arm to comb your hair. Avoid strenuous exercise. Ask your health care provider when it is okay to: Resume your normal activities. Return to work or school. Resume sexual activity. Eating and drinking Eat a heart-healthy diet. This should include plenty of fresh fruits and vegetables, whole grains, low-fat dairy products, and lean protein like chicken and fish. Limit alcohol intake to no more than 1 drink a day for non-pregnant women and 2 drinks a day for men. One drink equals 12 oz of beer, 5 oz of wine, or 1 oz of hard liquor. Check ingredients and nutrition facts on packaged foods and beverages. Avoid the following types of food: Food that is high in salt (sodium). Food that is high in saturated fat, like full-fat dairy or red meat. Food that is high in trans fat, like fried food. Food and drinks that are high in sugar. Lifestyle Do not use any products that contain nicotine or tobacco, such as cigarettes and e-cigarettes. If you need help quitting, ask your health care provider. Take steps to manage and control your weight. Once cleared, get regular exercise. Aim for 150 minutes of moderate-intensity exercise (such as walking or yoga) or 75 minutes of vigorous exercise (such as running or swimming) each week. Manage other health problems, such as diabetes or high blood pressure. Ask your health care provider how you can manage these conditions.  General instructions Do not drive for 24 hours after your procedure if you were given a medicine to help you relax (sedative). Take over-the-counter and prescription medicines only as told by your health care provider. Avoid putting pressure on the area where the cardiac device was placed. If you need an MRI after your cardiac device has been placed, be sure to tell the health care provider who orders the MRI that you have a cardiac  device. Avoid close and prolonged exposure to electrical devices that have strong magnetic fields. These include: Cell phones. Avoid keeping them in a pocket near the cardiac device, and try using the ear opposite the cardiac device. MP3 players. Household appliances, like microwaves. Metal detectors. Electric generators. High-tension wires. Keep all follow-up visits as directed by your health care provider. This is important. Contact a health care provider if: You have pain at the incision site that is not relieved by over-the-counter or prescription medicines. You have any of these around your incision site or coming from it: More redness, swelling, or pain. Fluid or blood. Warmth to the touch. Pus or a bad smell. You have a fever. You feel brief, occasional palpitations, light-headedness, or any symptoms that you think might be related to your heart. Get help right away if: You experience chest pain that is different from the pain at the cardiac device site. You develop a red streak that extends above or below the incision site. You experience shortness of breath. You have palpitations or an irregular heartbeat. You have light-headedness that does not go away quickly. You faint or have dizzy spells. Your pulse suddenly drops or increases rapidly and does not return to normal. You begin to gain weight and your legs and ankles swell. Summary After your procedure, it is common to have pain, soreness, and some swelling where the cardiac device was inserted. Make sure to keep your incision clean and dry. Follow instructions from your health care provider about how to take care of your incision. Check your incision every day for signs of infection, such as more pain or swelling, pus or a bad smell, warmth, or leaking fluid and blood. Avoid strenuous exercise and lifting your left arm higher than your shoulder for 2 weeks, or as long as told by your health care provider. This information  is not intended to replace advice given to you by your health care provider. Make sure you discuss any questions you have with your health care provider.

## 2021-08-18 DIAGNOSIS — I350 Nonrheumatic aortic (valve) stenosis: Secondary | ICD-10-CM | POA: Diagnosis not present

## 2021-08-18 DIAGNOSIS — I4819 Other persistent atrial fibrillation: Secondary | ICD-10-CM | POA: Diagnosis not present

## 2021-08-18 DIAGNOSIS — E7849 Other hyperlipidemia: Secondary | ICD-10-CM | POA: Diagnosis not present

## 2021-08-18 DIAGNOSIS — I10 Essential (primary) hypertension: Secondary | ICD-10-CM | POA: Diagnosis not present

## 2021-08-18 DIAGNOSIS — E782 Mixed hyperlipidemia: Secondary | ICD-10-CM | POA: Diagnosis not present

## 2021-08-28 DIAGNOSIS — I350 Nonrheumatic aortic (valve) stenosis: Secondary | ICD-10-CM | POA: Diagnosis not present

## 2021-08-31 DIAGNOSIS — Z85820 Personal history of malignant melanoma of skin: Secondary | ICD-10-CM | POA: Diagnosis not present

## 2021-08-31 DIAGNOSIS — D485 Neoplasm of uncertain behavior of skin: Secondary | ICD-10-CM | POA: Diagnosis not present

## 2021-08-31 DIAGNOSIS — L570 Actinic keratosis: Secondary | ICD-10-CM | POA: Diagnosis not present

## 2021-08-31 DIAGNOSIS — L918 Other hypertrophic disorders of the skin: Secondary | ICD-10-CM | POA: Diagnosis not present

## 2021-08-31 DIAGNOSIS — C44719 Basal cell carcinoma of skin of left lower limb, including hip: Secondary | ICD-10-CM | POA: Diagnosis not present

## 2021-08-31 DIAGNOSIS — L920 Granuloma annulare: Secondary | ICD-10-CM | POA: Diagnosis not present

## 2021-08-31 DIAGNOSIS — Z85828 Personal history of other malignant neoplasm of skin: Secondary | ICD-10-CM | POA: Diagnosis not present

## 2021-08-31 DIAGNOSIS — B359 Dermatophytosis, unspecified: Secondary | ICD-10-CM | POA: Diagnosis not present

## 2021-09-08 ENCOUNTER — Ambulatory Visit: Payer: Medicare Other | Admitting: Family Medicine

## 2021-09-09 DIAGNOSIS — E782 Mixed hyperlipidemia: Secondary | ICD-10-CM | POA: Diagnosis not present

## 2021-09-09 DIAGNOSIS — I10 Essential (primary) hypertension: Secondary | ICD-10-CM | POA: Diagnosis not present

## 2021-09-09 DIAGNOSIS — I4819 Other persistent atrial fibrillation: Secondary | ICD-10-CM | POA: Diagnosis not present

## 2021-09-09 DIAGNOSIS — I350 Nonrheumatic aortic (valve) stenosis: Secondary | ICD-10-CM | POA: Diagnosis not present

## 2021-09-18 ENCOUNTER — Other Ambulatory Visit: Payer: Self-pay | Admitting: Family Medicine

## 2021-09-18 DIAGNOSIS — I10 Essential (primary) hypertension: Secondary | ICD-10-CM

## 2021-09-22 ENCOUNTER — Ambulatory Visit: Payer: Medicare Other | Admitting: Family Medicine

## 2021-10-19 DIAGNOSIS — M1710 Unilateral primary osteoarthritis, unspecified knee: Secondary | ICD-10-CM | POA: Diagnosis not present

## 2021-10-19 DIAGNOSIS — G5601 Carpal tunnel syndrome, right upper limb: Secondary | ICD-10-CM | POA: Diagnosis not present

## 2021-10-29 DIAGNOSIS — Z85828 Personal history of other malignant neoplasm of skin: Secondary | ICD-10-CM | POA: Diagnosis not present

## 2021-10-29 DIAGNOSIS — Z86008 Personal history of in-situ neoplasm of other site: Secondary | ICD-10-CM | POA: Diagnosis not present

## 2021-10-29 DIAGNOSIS — C44712 Basal cell carcinoma of skin of right lower limb, including hip: Secondary | ICD-10-CM | POA: Diagnosis not present

## 2021-10-29 DIAGNOSIS — C44719 Basal cell carcinoma of skin of left lower limb, including hip: Secondary | ICD-10-CM | POA: Diagnosis not present

## 2021-10-29 DIAGNOSIS — B354 Tinea corporis: Secondary | ICD-10-CM | POA: Diagnosis not present

## 2021-10-29 DIAGNOSIS — L821 Other seborrheic keratosis: Secondary | ICD-10-CM | POA: Diagnosis not present

## 2021-10-29 DIAGNOSIS — D485 Neoplasm of uncertain behavior of skin: Secondary | ICD-10-CM | POA: Diagnosis not present

## 2021-10-29 DIAGNOSIS — Z85820 Personal history of malignant melanoma of skin: Secondary | ICD-10-CM | POA: Diagnosis not present

## 2021-10-29 DIAGNOSIS — L570 Actinic keratosis: Secondary | ICD-10-CM | POA: Diagnosis not present

## 2021-10-30 ENCOUNTER — Ambulatory Visit (INDEPENDENT_AMBULATORY_CARE_PROVIDER_SITE_OTHER): Payer: Medicare Other | Admitting: Family Medicine

## 2021-10-30 ENCOUNTER — Encounter: Payer: Self-pay | Admitting: Family Medicine

## 2021-10-30 ENCOUNTER — Other Ambulatory Visit: Payer: Self-pay

## 2021-10-30 VITALS — BP 129/73 | HR 65 | Temp 98.7°F | Resp 18 | Ht 72.0 in | Wt 389.0 lb

## 2021-10-30 DIAGNOSIS — Z23 Encounter for immunization: Secondary | ICD-10-CM | POA: Diagnosis not present

## 2021-10-30 DIAGNOSIS — R739 Hyperglycemia, unspecified: Secondary | ICD-10-CM | POA: Diagnosis not present

## 2021-10-30 DIAGNOSIS — E7800 Pure hypercholesterolemia, unspecified: Secondary | ICD-10-CM

## 2021-10-30 DIAGNOSIS — Z0000 Encounter for general adult medical examination without abnormal findings: Secondary | ICD-10-CM | POA: Diagnosis not present

## 2021-10-30 DIAGNOSIS — I442 Atrioventricular block, complete: Secondary | ICD-10-CM | POA: Diagnosis not present

## 2021-10-30 DIAGNOSIS — Z125 Encounter for screening for malignant neoplasm of prostate: Secondary | ICD-10-CM

## 2021-10-30 DIAGNOSIS — I10 Essential (primary) hypertension: Secondary | ICD-10-CM | POA: Diagnosis not present

## 2021-10-30 DIAGNOSIS — G7000 Myasthenia gravis without (acute) exacerbation: Secondary | ICD-10-CM

## 2021-10-30 NOTE — Patient Instructions (Signed)
Please review the attached list of medications and notify my office if there are any errors.   Please bring all of your medications to every appointment so we can make sure that our medication list is the same as yours.   I strongly recommend a Covid bivalent (omicron) booster if you have not yet had one

## 2021-10-30 NOTE — Progress Notes (Signed)
Complete Physical Exam      Patient: Garrett Woods, Male    DOB: 1955/12/06, 65 y.o.   MRN: [id number] Visit Date: 10/30/2021  Today's Provider: Lelon Huh, MD   Chief Complaint  Patient presents with   Annual Exam   Hypertension   Hyperlipidemia   Subjective    CORDARRELL Woods is a 65 y.o. male who presents today for his complete physical examination  He reports consuming a general diet. The patient does not participate in regular exercise at present. He generally feels fairly well. He reports sleeping fairly well. He does not have additional problems to discuss today.   HPI Hypertension, follow-up  BP Readings from Last 3 Encounters:  10/30/21 129/73  08/04/21 120/82  05/11/21 104/76   Wt Readings from Last 3 Encounters:  10/30/21 (!) 389 lb (176.4 kg)  08/04/21 (!) 380 lb (172.4 kg)  05/11/21 (!) 384 lb (174.2 kg)     He was last seen for hypertension 1  year  ago.  BP at that visit was 122/73. Management since that visit includes continuing same medication.  He reports good compliance with treatment. He is not having side effects.  He is following a Regular diet. He is not exercising. He does not smoke.  Use of agents associated with hypertension: none.   Outside blood pressures are 130/80. Symptoms: No chest pain No chest pressure  No palpitations No syncope  No dyspnea No orthopnea  No paroxysmal nocturnal dyspnea No lower extremity edema   Pertinent labs: Lab Results  Component Value Date   NA 138 08/26/2020   K 4.4 08/26/2020   CREATININE 1.08 08/26/2020   GFRNONAA 71 08/26/2020   GLUCOSE 135 (H) 08/26/2020   TSH 2.680 06/22/2018     The 10-year ASCVD risk score (Arnett DK, et al., 2019) is: 17.5%   ---------------------------------------------------------------------------------------------------   Lipid/Cholesterol, Follow-up  Last lipid panel Other pertinent labs  Lab Results  Component Value Date   CHOL 159 08/26/2020    HDL 37 (L) 08/26/2020   LDLCALC 104 (H) 08/26/2020   TRIG 99 08/26/2020   CHOLHDL 4.3 08/26/2020   Lab Results  Component Value Date   ALT 31 08/26/2020   AST 33 08/26/2020   PLT 174 08/26/2020   TSH 2.680 06/22/2018     He was last seen for this 1  year  ago.  Management since that visit includes continuing same medication.  He reports fair compliance with treatment. He is not having side effects.   Symptoms: No chest pain No chest pressure/discomfort  No dyspnea No lower extremity edema  No numbness or tingling of extremity No orthopnea  No palpitations No paroxysmal nocturnal dyspnea  No speech difficulty No syncope   Current diet: in general, an "unhealthy" diet Current exercise: none  The 10-year ASCVD risk score (Arnett DK, et al., 2019) is: 17.5%  ---------------------------------------------------------------------------------------------------    Medications: Outpatient Medications Prior to Visit  Medication Sig   acetaminophen (TYLENOL) 500 MG tablet Take 1,000 mg by mouth every 6 (six) hours as needed for mild pain.   apixaban (ELIQUIS) 5 MG TABS tablet Take 5 mg by mouth 2 (two) times daily.   carboxymethylcellulose (REFRESH PLUS) 0.5 % SOLN Place 1 drop into both eyes 3 (three) times daily as needed (dry eyes).   lovastatin (MEVACOR) 40 MG tablet TAKE 1 TABLET BY MOUTH EVERY DAY   oxymetazoline (AFRIN) 0.05 % nasal spray Place 1 spray into both nostrils 2 (two) times  daily as needed for congestion.   valsartan-hydrochlorothiazide (DIOVAN-HCT) 160-25 MG tablet TAKE 1 TABLET BY MOUTH EVERY DAY   No facility-administered medications prior to visit.    Allergies  Allergen Reactions   Amoxicillin Rash    Patient Care Team: Birdie Sons, MD as PCP - General (Family Medicine) Christene Slates, MD (Dermatology) Alexis Goodell, MD as Consulting Physician (Neurology) Noreene Filbert, MD as Referring Physician (Radiation Oncology) Collier Flowers, MD as  Referring Physician (Urology) Corey Skains, MD as Consulting Physician (Cardiology)  Review of Systems  Constitutional:  Negative for appetite change, chills, fatigue and fever.  HENT:  Negative for congestion, ear pain, hearing loss, nosebleeds and trouble swallowing.   Eyes:  Negative for pain and visual disturbance.  Respiratory:  Negative for cough, chest tightness and shortness of breath.   Cardiovascular:  Negative for chest pain, palpitations and leg swelling.  Gastrointestinal:  Negative for abdominal pain, blood in stool, constipation, diarrhea, nausea and vomiting.  Endocrine: Negative for polydipsia, polyphagia and polyuria.  Genitourinary:  Positive for frequency. Negative for dysuria and flank pain.  Musculoskeletal:  Positive for arthralgias. Negative for back pain, joint swelling, myalgias and neck stiffness.  Skin:  Negative for color change, rash and wound.  Neurological:  Positive for weakness. Negative for dizziness, tremors, seizures, speech difficulty, light-headedness and headaches.  Psychiatric/Behavioral:  Negative for behavioral problems, confusion, decreased concentration, dysphoric mood and sleep disturbance. The patient is not nervous/anxious.   All other systems reviewed and are negative.      Objective    Vitals: BP 129/73 (BP Location: Right Arm, Patient Position: Sitting, Cuff Size: Large)    Pulse 65    Temp 98.7 F (37.1 C) (Oral)    Resp 18    Ht 6' (1.829 m)    Wt (!) 389 lb (176.4 kg)    SpO2 98% Comment: room air   BMI 52.76 kg/m     Physical Exam  General Appearance:    Severely obese male. Alert, cooperative, in no acute distress, appears stated age  Head:    Normocephalic, without obvious abnormality, atraumatic  Eyes:    PERRL, conjunctiva/corneas clear, EOM's intact, fundi    benign, both eyes       Ears:    Normal TM's and external ear canals, both ears  Nose:   Nares normal, septum midline, mucosa normal, no drainage   or sinus  tenderness  Throat:   Lips, mucosa, and tongue normal; teeth and gums normal  Neck:   Supple, symmetrical, trachea midline, no adenopathy;       thyroid:  No enlargement/tenderness/nodules; no carotid   bruit or JVD  Back:     Symmetric, no curvature, ROM normal, no CVA tenderness  Lungs:     Clear to auscultation bilaterally, respirations unlabored  Chest wall:    No tenderness or deformity  Heart:    Normal heart rate. Normal rhythm. No murmurs, rubs, or gallops.  S1 and S2 normal  Abdomen:     Soft, non-tender, bowel sounds active all four quadrants,    no masses, no organomegaly  Genitalia:    deferred  Rectal:    deferred  Extremities:   All extremities are intact. No cyanosis or edema  Pulses:   2+ and symmetric all extremities  Skin:   Skin color, texture, turgor normal, no rashes or lesions  Lymph nodes:   Cervical, supraclavicular, and axillary nodes normal  Neurologic:   CNII-XII intact. Normal strength, sensation  and reflexes      throughout      Assessment & Plan    1. Annual physical exam   2. Need for vaccination against Streptococcus pneumoniae  - Pneumococcal conjugate vaccine 20-valent (Preferred)  3. Myasthenia gravis ([place]) Stable, continue neurology follow up .   4. 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 Obesity, morbid (more than 100 lbs over ideal weight or BMI > 40) (HCC) Discussed GLP-1 agonists for weight loss which he is going to think about. Encouraged healthy diet and exercise.   6. Prostate cancer screening  - PSA Total (Reflex To Free) ([facility] only)  7. Essential (primary) hypertension Doing well on current medications.  - CBC - Comprehensive metabolic panel  8. Pure hypercholesterolemia He is tolerating lovastatin well with no adverse effects.   - Lipid panel  9. Hyperglycemia  - Hemoglobin A1c       The entirety of the information documented in the History of Present  Illness, Review of Systems and Physical Exam were personally obtained by me. Portions of this information were initially documented by the CMA and reviewed by me for thoroughness and accuracy.     Lelon Huh, MD  [facility] [phone] (phone) [phone] (fax)  [place]

## 2021-10-31 LAB — COMPREHENSIVE METABOLIC PANEL
ALT: 26 IU/L (ref 0–44)
AST: 29 IU/L (ref 0–40)
Albumin/Globulin Ratio: 1.8 (ref 1.2–2.2)
Albumin: 4.7 g/dL (ref 3.8–4.8)
Alkaline Phosphatase: 70 IU/L (ref 44–121)
BUN/Creatinine Ratio: 18 (ref 10–24)
BUN: 20 mg/dL (ref 8–27)
Bilirubin Total: 0.4 mg/dL (ref 0.0–1.2)
CO2: 22 mmol/L (ref 20–29)
Calcium: 9.6 mg/dL (ref 8.6–10.2)
Chloride: 100 mmol/L (ref 96–106)
Creatinine, Ser: 1.11 mg/dL (ref 0.76–1.27)
Globulin, Total: 2.6 g/dL (ref 1.5–4.5)
Glucose: 98 mg/dL (ref 70–99)
Potassium: 4.1 mmol/L (ref 3.5–5.2)
Sodium: 140 mmol/L (ref 134–144)
Total Protein: 7.3 g/dL (ref 6.0–8.5)
eGFR: 73 mL/min/{1.73_m2} (ref >59)

## 2021-10-31 LAB — CBC
Hematocrit: 41.4 % (ref 37.5–51.0)
Hemoglobin: 14.5 g/dL (ref 13.0–17.7)
MCH: 33.2 pg — ABNORMAL HIGH (ref 26.6–33.0)
MCHC: 35 g/dL (ref 31.5–35.7)
MCV: 95 fL (ref 79–97)
Platelets: 221 10*3/uL (ref 150–450)
RBC: 4.37 x10E6/uL (ref 4.14–5.80)
RDW: 13.1 % (ref 11.6–15.4)
WBC: 9.9 10*3/uL (ref 3.4–10.8)

## 2021-10-31 LAB — LIPID PANEL
Chol/HDL Ratio: 5.9 ratio — ABNORMAL HIGH (ref 0.0–5.0)
Cholesterol, Total: 211 mg/dL — ABNORMAL HIGH (ref 100–199)
HDL: 36 mg/dL — ABNORMAL LOW (ref >39)
LDL Chol Calc (NIH): 125 mg/dL — ABNORMAL HIGH (ref 0–99)
Triglycerides: 282 mg/dL — ABNORMAL HIGH (ref 0–149)
VLDL Cholesterol Cal: 50 mg/dL — ABNORMAL HIGH (ref 5–40)

## 2021-10-31 LAB — HEMOGLOBIN A1C
Est. average glucose Bld gHb Est-mCnc: 128 mg/dL
Hgb A1c MFr Bld: 6.1 % — ABNORMAL HIGH (ref 4.8–5.6)

## 2021-10-31 LAB — PSA TOTAL (REFLEX TO FREE): Prostate Specific Ag, Serum: 0.3 ng/mL (ref 0.0–4.0)

## 2021-11-03 NOTE — Progress Notes (Signed)
Annual Wellness Visit     Patient: Garrett Woods, Male    DOB: 05-22-1956, 66 y.o.   MRN: [id number] Visit Date: 10/30/2021  Today's Provider: Lelon Huh, MD    Subjective    Garrett Woods is a 66 y.o. male who presents today for his Annual Wellness Visit.    Medications: Outpatient Medications Prior to Visit  Medication Sig   acetaminophen (TYLENOL) 500 MG tablet Take 1,000 mg by mouth every 6 (six) hours as needed for mild pain.   apixaban (ELIQUIS) 5 MG TABS tablet Take 5 mg by mouth 2 (two) times daily.   carboxymethylcellulose (REFRESH PLUS) 0.5 % SOLN Place 1 drop into both eyes 3 (three) times daily as needed (dry eyes).   lovastatin (MEVACOR) 40 MG tablet TAKE 1 TABLET BY MOUTH EVERY DAY   oxymetazoline (AFRIN) 0.05 % nasal spray Place 1 spray into both nostrils 2 (two) times daily as needed for congestion.   valsartan-hydrochlorothiazide (DIOVAN-HCT) 160-25 MG tablet TAKE 1 TABLET BY MOUTH EVERY DAY   No facility-administered medications prior to visit.    Allergies  Allergen Reactions   Amoxicillin Rash    Patient Care Team: Birdie Sons, MD as PCP - General (Family Medicine) Christene Slates, MD (Dermatology) Alexis Goodell, MD as Consulting Physician (Neurology) Noreene Filbert, MD as Referring Physician (Radiation Oncology) Collier Flowers, MD as Referring Physician (Urology) Corey Skains, MD as Consulting Physician (Cardiology)        Objective     Most recent functional status assessment: In your present state of health, do you have any difficulty performing the following activities: 10/30/2021  Hearing? N  Vision? N  Difficulty concentrating or making decisions? N  Walking or climbing stairs? Y  Dressing or bathing? N  Doing errands, shopping? Y  Preparing Food and eating ? -  Using the Toilet? -  In the past six months, have you accidently leaked urine? -  Do you have problems with loss of bowel control? -   Managing your Medications? -  Managing your Finances? -  Housekeeping or managing your Housekeeping? -  Some recent data might be hidden   Most recent fall risk assessment: Fall Risk  10/30/2021  Falls in the past year? 0  Number falls in past yr: 0  Injury with Fall? 0  Risk for fall due to : No Fall Risks  Follow up Falls evaluation completed    Most recent depression screenings: PHQ 2/9 Scores 10/30/2021 10/28/2020  PHQ - 2 Score 0 0  PHQ- 9 Score 2 0   Most recent cognitive screening: No flowsheet data found. Most recent Audit-C alcohol use screening Alcohol Use Disorder Test (AUDIT) 10/30/2021  1. How often do you have a drink containing alcohol? 1  2. How many drinks containing alcohol do you have on a typical day when you are drinking? 0  3. How often do you have six or more drinks on one occasion? 0  AUDIT-C Score 1  Alcohol Brief Interventions/Follow-up -   A score of 3 or more in women, and 4 or more in men indicates increased risk for alcohol abuse, EXCEPT if all of the points are from question 1     Assessment & Plan     Annual wellness visit done today including the all of the following: Reviewed patient's Family Medical History Reviewed and updated list of patient's medical providers Assessment of cognitive impairment was done Assessed patient's functional ability Established a  written schedule for health screening services Health Risk Assessent Completed and Reviewed  Exercise Activities and Dietary recommendations  Goals   None     Immunization History  Administered Date(s) Administered   Fluad Quad(high Dose 65+) 10/15/2019, 08/27/2020   Influenza, High Dose Seasonal PF 09/10/2021   Influenza,inj,Quad PF,6+ Mos 10/08/2016   PFIZER(Purple Top)SARS-COV-2 Vaccination 11/27/2019, 12/18/2019, 08/01/2020   PNEUMOCOCCAL CONJUGATE-20 10/30/2021   Pneumococcal Polysaccharide-23 10/28/2020   Tdap 03/20/2013   Zoster Recombinat (Shingrix) 06/22/2018,  08/22/2018    Health Maintenance  Topic Date Due   Hepatitis C Screening  Never done   COVID-19 Vaccine (4 - Booster for Pfizer series) 09/26/2020   TETANUS/TDAP  03/21/2023   COLONOSCOPY (Pts 45-68yrs Insurance coverage will need to be confirmed)  02/08/2027   Pneumonia Vaccine 34+ Years old  Completed   INFLUENZA VACCINE  Completed   Zoster Vaccines- Shingrix  Completed   HPV VACCINES  Aged Out     Discussed health benefits of physical activity, and encouraged him to engage in regular exercise appropriate for his age and condition.         The entirety of the information documented in the History of Present Illness, Review of Systems and Physical Exam were personally obtained by me. Portions of this information were initially documented by the CMA and reviewed by me for thoroughness and accuracy.     Lelon Huh, MD  [facility] [phone] (phone) [phone] (fax)  [place]

## 2021-11-09 DIAGNOSIS — M1712 Unilateral primary osteoarthritis, left knee: Secondary | ICD-10-CM | POA: Diagnosis not present

## 2021-11-10 DIAGNOSIS — I442 Atrioventricular block, complete: Secondary | ICD-10-CM | POA: Diagnosis not present

## 2021-11-11 ENCOUNTER — Encounter: Payer: Self-pay | Admitting: Family Medicine

## 2021-11-11 DIAGNOSIS — C44719 Basal cell carcinoma of skin of left lower limb, including hip: Secondary | ICD-10-CM | POA: Diagnosis not present

## 2021-11-11 DIAGNOSIS — L578 Other skin changes due to chronic exposure to nonionizing radiation: Secondary | ICD-10-CM | POA: Diagnosis not present

## 2021-11-11 DIAGNOSIS — Z85828 Personal history of other malignant neoplasm of skin: Secondary | ICD-10-CM | POA: Diagnosis not present

## 2021-11-30 DIAGNOSIS — G5601 Carpal tunnel syndrome, right upper limb: Secondary | ICD-10-CM | POA: Diagnosis not present

## 2021-12-03 ENCOUNTER — Other Ambulatory Visit: Payer: Self-pay | Admitting: Family Medicine

## 2021-12-28 DIAGNOSIS — D485 Neoplasm of uncertain behavior of skin: Secondary | ICD-10-CM | POA: Diagnosis not present

## 2021-12-28 DIAGNOSIS — C44629 Squamous cell carcinoma of skin of left upper limb, including shoulder: Secondary | ICD-10-CM | POA: Diagnosis not present

## 2021-12-28 DIAGNOSIS — C44719 Basal cell carcinoma of skin of left lower limb, including hip: Secondary | ICD-10-CM | POA: Diagnosis not present

## 2021-12-28 DIAGNOSIS — C44712 Basal cell carcinoma of skin of right lower limb, including hip: Secondary | ICD-10-CM | POA: Diagnosis not present

## 2022-02-22 DIAGNOSIS — L570 Actinic keratosis: Secondary | ICD-10-CM | POA: Diagnosis not present

## 2022-02-22 DIAGNOSIS — C44719 Basal cell carcinoma of skin of left lower limb, including hip: Secondary | ICD-10-CM | POA: Diagnosis not present

## 2022-02-22 DIAGNOSIS — D492 Neoplasm of unspecified behavior of bone, soft tissue, and skin: Secondary | ICD-10-CM | POA: Diagnosis not present

## 2022-02-22 DIAGNOSIS — C44629 Squamous cell carcinoma of skin of left upper limb, including shoulder: Secondary | ICD-10-CM | POA: Diagnosis not present

## 2022-04-20 DIAGNOSIS — I442 Atrioventricular block, complete: Secondary | ICD-10-CM | POA: Diagnosis not present

## 2022-05-20 DIAGNOSIS — C44719 Basal cell carcinoma of skin of left lower limb, including hip: Secondary | ICD-10-CM | POA: Diagnosis not present

## 2022-05-20 DIAGNOSIS — I872 Venous insufficiency (chronic) (peripheral): Secondary | ICD-10-CM | POA: Diagnosis not present

## 2022-05-20 DIAGNOSIS — L570 Actinic keratosis: Secondary | ICD-10-CM | POA: Diagnosis not present

## 2022-05-20 DIAGNOSIS — C44629 Squamous cell carcinoma of skin of left upper limb, including shoulder: Secondary | ICD-10-CM | POA: Diagnosis not present

## 2022-05-20 DIAGNOSIS — D492 Neoplasm of unspecified behavior of bone, soft tissue, and skin: Secondary | ICD-10-CM | POA: Diagnosis not present

## 2022-06-07 DIAGNOSIS — C44719 Basal cell carcinoma of skin of left lower limb, including hip: Secondary | ICD-10-CM | POA: Diagnosis not present

## 2022-06-07 DIAGNOSIS — L570 Actinic keratosis: Secondary | ICD-10-CM | POA: Diagnosis not present

## 2022-06-28 NOTE — Progress Notes (Signed)
I,Sulibeya S Dimas,acting as a scribe for Lelon Huh, MD.,have documented all relevant documentation on the behalf of Lelon Huh, MD,as directed by  Lelon Huh, MD while in the presence of Lelon Huh, MD.   Established patient visit   Patient: Garrett Woods   DOB: [birth year]   66 y.o. Male  MRN: [id number] Visit Date: 06/29/2022  Today's healthcare provider: Lelon Huh, MD   Chief Complaint  Patient presents with   Rectal Pain   Subjective    HPI  Rectal pain: Patient complains of rectal pain after having bowel movements, sometimes lasts for a few hours. Patient reports pain x a few weeks after episode of constipation. He reports constipation and diarrhea on and off. Patient denies any blood in his stools. 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  He also requests referral to local neurologist for follow up of Myasthenia Gravis and drop foot. He was diagnoses with MG many years ago and had been followed by Dr. Doy Mince in [place], but has not had flare or required medications for several years so has not returned for follow up. 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 He would like to have routine follow up with local neurologist for both of these problems.   Medications: Outpatient Medications Prior to Visit  Medication Sig   acetaminophen (TYLENOL) 500 MG tablet Take 1,000 mg by mouth every 6 (six) hours as needed for mild pain.   apixaban (ELIQUIS) 5 MG TABS tablet Take 5 mg by mouth 2 (two) times daily.   carboxymethylcellulose (REFRESH PLUS) 0.5 % SOLN Place 1 drop into both eyes 3 (three) times daily as needed (dry eyes).   lovastatin (MEVACOR) 40 MG tablet TAKE 1 TABLET BY MOUTH EVERY DAY   oxymetazoline (AFRIN) 0.05 % nasal spray Place 1 spray into both nostrils 2 (two) times daily as needed for congestion.   valsartan-hydrochlorothiazide (DIOVAN-HCT) 160-25 MG tablet TAKE 1 TABLET BY MOUTH EVERY  DAY   No facility-administered medications prior to visit.    Review of Systems  Constitutional:  Negative for appetite change and fever.  Respiratory:  Negative for shortness of breath.   Cardiovascular:  Negative for chest pain.  Gastrointestinal:  Positive for constipation and diarrhea. Negative for abdominal pain, anal bleeding, blood in stool, nausea and vomiting.       Objective    BP 132/85 (BP Location: Left Arm, Patient Position: Sitting, Cuff Size: Large)   Pulse 81   Temp 98 F (36.7 C) (Oral)   Resp 16   Wt (!) 377 lb 11.2 oz (171.3 kg)   BMI 51.23 kg/m    Physical Exam   Rectal: No masses or hemorrhoids. Normal brown stool. Tender posterior rectal vault. No discrete lesions palpated. No perirectal lesions visualized.   Assessment & Plan     1. Rectal pain Suspect healing laceration of fissure since episode of constipation a few weeks ago. Try  pramoxine-hydrocortisone (ANALPRAM HC) cream; Apply topically 2 (two) times daily.  Dispense: 30 g; Refill: 1  2. Radiation proctitis  3. Foot drop, right  4. Myasthenia gravis [facility]) Previously followed by Dr. Doy Mince in [place], but would like to establish with local neurologist.  - Ambulatory referral to Neurology      The entirety of the information documented in the History of Present Illness, Review of Systems and Physical Exam were personally obtained by me. Portions of this information were initially documented by  the CMA and reviewed by me for thoroughness and accuracy.     Lelon Huh, MD  [facility] [phone] (phone) [phone] (fax)  [place]

## 2022-06-29 ENCOUNTER — Ambulatory Visit (INDEPENDENT_AMBULATORY_CARE_PROVIDER_SITE_OTHER): Payer: Medicare Other | Admitting: Family Medicine

## 2022-06-29 ENCOUNTER — Encounter: Payer: Self-pay | Admitting: Family Medicine

## 2022-06-29 VITALS — BP 132/85 | HR 81 | Temp 98.0°F | Resp 16 | Wt 377.7 lb

## 2022-06-29 DIAGNOSIS — M21371 Foot drop, right foot: Secondary | ICD-10-CM | POA: Diagnosis not present

## 2022-06-29 DIAGNOSIS — K627 Radiation proctitis: Secondary | ICD-10-CM

## 2022-06-29 DIAGNOSIS — G7000 Myasthenia gravis without (acute) exacerbation: Secondary | ICD-10-CM | POA: Diagnosis not present

## 2022-06-29 DIAGNOSIS — K6289 Other specified diseases of anus and rectum: Secondary | ICD-10-CM

## 2022-06-29 MED ORDER — PRAMOXINE-HC 1-1 % EX CREA: TOPICAL_CREAM | Freq: Two times a day (BID) | CUTANEOUS | 1 refills | Status: DC

## 2022-06-30 DIAGNOSIS — Z961 Presence of intraocular lens: Secondary | ICD-10-CM | POA: Diagnosis not present

## 2022-07-12 DIAGNOSIS — I350 Nonrheumatic aortic (valve) stenosis: Secondary | ICD-10-CM | POA: Diagnosis not present

## 2022-07-12 DIAGNOSIS — I4819 Other persistent atrial fibrillation: Secondary | ICD-10-CM | POA: Diagnosis not present

## 2022-07-12 DIAGNOSIS — Z950 Presence of cardiac pacemaker: Secondary | ICD-10-CM | POA: Diagnosis not present

## 2022-07-12 DIAGNOSIS — E782 Mixed hyperlipidemia: Secondary | ICD-10-CM | POA: Diagnosis not present

## 2022-07-12 DIAGNOSIS — I442 Atrioventricular block, complete: Secondary | ICD-10-CM | POA: Diagnosis not present

## 2022-07-12 DIAGNOSIS — I10 Essential (primary) hypertension: Secondary | ICD-10-CM | POA: Diagnosis not present

## 2022-08-17 ENCOUNTER — Ambulatory Visit (INDEPENDENT_AMBULATORY_CARE_PROVIDER_SITE_OTHER): Payer: Medicare Other | Admitting: Family Medicine

## 2022-08-17 ENCOUNTER — Encounter: Payer: Self-pay | Admitting: Family Medicine

## 2022-08-17 VITALS — BP 119/83 | HR 71 | Resp 16 | Wt 373.0 lb

## 2022-08-17 DIAGNOSIS — R198 Other specified symptoms and signs involving the digestive system and abdomen: Secondary | ICD-10-CM | POA: Diagnosis not present

## 2022-08-17 DIAGNOSIS — Z23 Encounter for immunization: Secondary | ICD-10-CM | POA: Diagnosis not present

## 2022-08-17 DIAGNOSIS — K627 Radiation proctitis: Secondary | ICD-10-CM | POA: Diagnosis not present

## 2022-08-17 DIAGNOSIS — K6289 Other specified diseases of anus and rectum: Secondary | ICD-10-CM

## 2022-08-17 MED ORDER — SUCRALFATE 1 G PO TABS: 1.0000 g | ORAL_TABLET | Freq: Three times a day (TID) | ORAL | 1 refills | Status: DC

## 2022-08-17 NOTE — Progress Notes (Signed)
I,April Miller,acting as a scribe for Lelon Huh, MD.,have documented all relevant documentation on the behalf of Lelon Huh, MD,as directed by  Lelon Huh, MD while in the presence of Lelon Huh, MD.    Established patient visit   Patient: Garrett Woods   DOB: [birth year]   66 y.o. Male  MRN: [id number] Visit Date: 08/17/2022  Today's healthcare provider: Lelon Huh, MD   Chief Complaint  Patient presents with   Rectal Pain   Follow-up   Subjective    HPI  Patient is a 66 year old male who presents for recurrent rectal pain.  He was last seen for this on 06/29/22.  At that time it was suspected he had either radiation proctitis or a healing laceration or fissure from episode of constipation prior to visit.  He was treated with Analpram HC cream which he states helped with pain for a few weeks, but seems just as bad now. States he has pain with every bowel movement. He alternates between being constipated and having diarrhea. No blood or mucous in stool. 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    Medications: Outpatient Medications Prior to Visit  Medication Sig   acetaminophen (TYLENOL) 500 MG tablet Take 1,000 mg by mouth every 6 (six) hours as needed for mild pain.   apixaban (ELIQUIS) 5 MG TABS tablet Take 5 mg by mouth 2 (two) times daily.   carboxymethylcellulose (REFRESH PLUS) 0.5 % SOLN Place 1 drop into both eyes 3 (three) times daily as needed (dry eyes).   hydrocortisone-pramoxine (ANALPRAM-HC) 2.5-1 % rectal cream 2 (two) times daily.   lovastatin (MEVACOR) 40 MG tablet TAKE 1 TABLET BY MOUTH EVERY DAY   oxymetazoline (AFRIN) 0.05 % nasal spray Place 1 spray into both nostrils 2 (two) times daily as needed for congestion.   pramoxine-hydrocortisone (ANALPRAM HC) cream Apply topically 2 (two) times daily.   valsartan-hydrochlorothiazide (DIOVAN-HCT) 160-25 MG tablet TAKE 1 TABLET BY MOUTH EVERY DAY   No facility-administered  medications prior to visit.    Review of Systems     Objective    BP 119/83 (BP Location: Right Arm, Patient Position: Sitting, Cuff Size: Large)   Pulse 71   Resp 16   Wt (!) 373 lb (169.2 kg)   SpO2 98%   BMI 50.59 kg/m    Physical Exam  General appearance: Obese male, cooperative and in no acute distress Head: Normocephalic, without obvious abnormality, atraumatic Respiratory: Respirations even and unlabored, normal respiratory rate Extremities: All extremities are intact.  Skin: Skin color, texture, turgor normal. No rashes seen  Psych: Appropriate mood and affect. Neurologic: Mental status: Alert, oriented to person, place, and time, thought content appropriate.   Assessment & Plan     1. Rectal pain Briefly improved with analpram-hc, but now back to baseline. Suspect radiatio proctitis  - Ambulatory referral to Gastroenterology  2. Radiation proctitis (suspected) try sucralfate (CARAFATE) 1 g tablet; Take 1 tablet (1 g total) by mouth 4 (four) times daily -  with meals and at bedtime.  Dispense: 120 tablet; Refill: 1  3. Alternating constipation and diarrhea Advised to start using OTC psyllium based fiber powder every day to regulate BM3.   3. Need for immunization against influenza  - Flu Vaccine QUAD High Dose(Fluad)      The entirety of the information documented in the History of Present Illness, Review of Systems and Physical Exam were personally obtained by me. Portions of this information were  initially documented by the CMA and reviewed by me for thoroughness and accuracy.     Lelon Huh, MD  [facility] [phone] (phone) [phone] (fax)  [place]

## 2022-08-23 ENCOUNTER — Encounter: Payer: Self-pay | Admitting: Family Medicine

## 2022-08-23 DIAGNOSIS — N419 Inflammatory disease of prostate, unspecified: Secondary | ICD-10-CM

## 2022-08-24 ENCOUNTER — Other Ambulatory Visit: Payer: Self-pay | Admitting: Family Medicine

## 2022-08-24 DIAGNOSIS — R339 Retention of urine, unspecified: Secondary | ICD-10-CM

## 2022-08-24 DIAGNOSIS — K6289 Other specified diseases of anus and rectum: Secondary | ICD-10-CM

## 2022-08-25 MED ORDER — HYDROCODONE-ACETAMINOPHEN 7.5-325 MG PO TABS: 1.0000 | ORAL_TABLET | Freq: Three times a day (TID) | ORAL | 0 refills | Status: AC | PRN

## 2022-09-01 DIAGNOSIS — I442 Atrioventricular block, complete: Secondary | ICD-10-CM | POA: Diagnosis not present

## 2022-09-07 ENCOUNTER — Ambulatory Visit
Admission: RE | Admit: 2022-09-07 | Discharge: 2022-09-07 | Disposition: A | Discharge status: Home / Self Care | Payer: Medicare Other | Source: Ambulatory Visit | Attending: Family Medicine | Admitting: Family Medicine

## 2022-09-07 DIAGNOSIS — K6289 Other specified diseases of anus and rectum: Secondary | ICD-10-CM | POA: Diagnosis not present

## 2022-09-07 DIAGNOSIS — K4090 Unilateral inguinal hernia, without obstruction or gangrene, not specified as recurrent: Secondary | ICD-10-CM | POA: Diagnosis not present

## 2022-09-07 DIAGNOSIS — R339 Retention of urine, unspecified: Secondary | ICD-10-CM | POA: Diagnosis not present

## 2022-09-07 DIAGNOSIS — K5730 Diverticulosis of large intestine without perforation or abscess without bleeding: Secondary | ICD-10-CM | POA: Diagnosis not present

## 2022-09-07 LAB — POCT I-STAT CREATININE: Creatinine, Ser: 1.2 mg/dL (ref 0.61–1.24)

## 2022-09-07 MED ORDER — IOHEXOL 300 MG/ML  SOLN
100.0000 mL | Freq: Once | INTRAMUSCULAR | Status: AC | PRN
  Administered 2022-09-07: 100 mL via INTRAVENOUS

## 2022-09-10 MED ORDER — CIPROFLOXACIN HCL 500 MG PO TABS: 500.0000 mg | ORAL_TABLET | Freq: Two times a day (BID) | ORAL | 0 refills | Status: AC

## 2022-09-10 NOTE — Addendum Note (Signed)
Addended by: Birdie Sons on: 09/10/2022 05:10 PM   Modules accepted: Orders

## 2022-11-03 ENCOUNTER — Telehealth: Payer: Self-pay | Admitting: Family Medicine

## 2022-11-03 NOTE — Telephone Encounter (Signed)
Left message for patient to call back and schedule Medicare Annual Wellness Visit (AWV) in office.   If not able to come in office, please offer to do virtually or by telephone.  Left office number and my jabber [phone].  Last AWV:10/30/2021   Please schedule at anytime with Nurse Health Advisor.

## 2022-11-11 DIAGNOSIS — M1712 Unilateral primary osteoarthritis, left knee: Secondary | ICD-10-CM | POA: Diagnosis not present

## 2022-11-19 ENCOUNTER — Other Ambulatory Visit: Payer: Self-pay | Admitting: Family Medicine

## 2022-11-19 DIAGNOSIS — L858 Other specified epidermal thickening: Secondary | ICD-10-CM | POA: Diagnosis not present

## 2022-11-19 DIAGNOSIS — L570 Actinic keratosis: Secondary | ICD-10-CM | POA: Diagnosis not present

## 2022-11-19 DIAGNOSIS — I10 Essential (primary) hypertension: Secondary | ICD-10-CM

## 2022-11-19 DIAGNOSIS — D492 Neoplasm of unspecified behavior of bone, soft tissue, and skin: Secondary | ICD-10-CM | POA: Diagnosis not present

## 2022-11-19 DIAGNOSIS — Z85828 Personal history of other malignant neoplasm of skin: Secondary | ICD-10-CM | POA: Diagnosis not present

## 2022-11-19 DIAGNOSIS — L821 Other seborrheic keratosis: Secondary | ICD-10-CM | POA: Diagnosis not present

## 2022-11-19 DIAGNOSIS — L578 Other skin changes due to chronic exposure to nonionizing radiation: Secondary | ICD-10-CM | POA: Diagnosis not present

## 2022-11-19 DIAGNOSIS — Z85820 Personal history of malignant melanoma of skin: Secondary | ICD-10-CM | POA: Diagnosis not present

## 2022-11-19 DIAGNOSIS — C44619 Basal cell carcinoma of skin of left upper limb, including shoulder: Secondary | ICD-10-CM | POA: Diagnosis not present

## 2022-11-19 DIAGNOSIS — D0462 Carcinoma in situ of skin of left upper limb, including shoulder: Secondary | ICD-10-CM | POA: Diagnosis not present

## 2022-11-19 DIAGNOSIS — C44519 Basal cell carcinoma of skin of other part of trunk: Secondary | ICD-10-CM | POA: Diagnosis not present

## 2022-11-24 ENCOUNTER — Other Ambulatory Visit: Payer: Self-pay | Admitting: Family Medicine

## 2022-11-24 DIAGNOSIS — I10 Essential (primary) hypertension: Secondary | ICD-10-CM

## 2022-12-13 DIAGNOSIS — C44619 Basal cell carcinoma of skin of left upper limb, including shoulder: Secondary | ICD-10-CM | POA: Diagnosis not present

## 2022-12-13 DIAGNOSIS — D0462 Carcinoma in situ of skin of left upper limb, including shoulder: Secondary | ICD-10-CM | POA: Diagnosis not present

## 2022-12-21 NOTE — Progress Notes (Unsigned)
I,Garrett Woods,acting as a scribe for Lelon Huh, MD.,have documented all relevant documentation on the behalf of Lelon Huh, MD,as directed by  Lelon Huh, MD while in the presence of Lelon Huh, MD.    Complete Physical Exam      Patient: Garrett Woods, Male    DOB: Jul 20, 1956, 67 y.o.   MRN: [id number] Visit Date: 12/22/2022  Today's Provider: Lelon Huh, MD   Chief Complaint  Patient presents with   Medicare Wellness   Subjective    Garrett Woods is a 67 y.o. male who presents today for his complete physical examination. He reports consuming a general diet. The patient does not participate in regular exercise at present. He generally feels well. He reports sleeping well. He does not have additional problems to discuss today.   He is also due to follow up lipids and hypertension, doing well on current medications. 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 He reports pain has completely resolved after taking antibiotic. He has no other complaints today.    Medications: Outpatient Medications Prior to Visit  Medication Sig   acetaminophen (TYLENOL) 500 MG tablet Take 1,000 mg by mouth every 6 (six) hours as needed for mild pain.   apixaban (ELIQUIS) 5 MG TABS tablet Take 5 mg by mouth 2 (two) times daily.   lovastatin (MEVACOR) 40 MG tablet TAKE 1 TABLET BY MOUTH EVERY DAY   oxymetazoline (AFRIN) 0.05 % nasal spray Place 1 spray into both nostrils 2 (two) times daily as needed for congestion.   valsartan-hydrochlorothiazide (DIOVAN-HCT) 160-25 MG tablet TAKE 1 TABLET BY MOUTH EVERY DAY   carboxymethylcellulose (REFRESH PLUS) 0.5 % SOLN Place 1 drop into both eyes 3 (three) times daily as needed (dry eyes).   hydrocortisone-pramoxine (ANALPRAM-HC) 2.5-1 % rectal cream 2 (two) times daily.   pramoxine-hydrocortisone (ANALPRAM HC) cream Apply topically 2 (two) times daily.   sucralfate  (CARAFATE) 1 g tablet Take 1 tablet (1 g total) by mouth 4 (four) times daily -  with meals and at bedtime.   No facility-administered medications prior to visit.    Allergies  Allergen Reactions   Amoxicillin Rash    Patient Care Team: Birdie Sons, MD as PCP - General (Family Medicine) Christene Slates, MD (Dermatology) Alexis Goodell, MD as Consulting Physician (Neurology) Noreene Filbert, MD as Referring Physician (Radiation Oncology) Collier Flowers, MD as Referring Physician (Urology) Corey Skains, MD as Consulting Physician (Cardiology)  Review of Systems  Genitourinary:  Positive for frequency.  Neurological:  Positive for weakness and numbness.  All other systems reviewed and are negative.       Objective    Vitals: BP 132/71 (BP Location: Left Arm, Patient Position: Sitting, Cuff Size: Large)   Pulse 74   Temp 97.8 F (36.6 C) (Temporal)   Resp 20   Ht 6' (1.829 m)   Wt (!) 377 lb 12.8 oz (171.4 kg)   SpO2 98%   BMI 51.24 kg/m  BP Readings from Last 3 Encounters:  12/22/22 132/71  08/17/22 119/83  06/29/22 132/85   Wt Readings from Last 3 Encounters:  12/22/22 (!) 377 lb 12.8 oz (171.4 kg)  08/17/22 (!) 373 lb (169.2 kg)  06/29/22 (!) 377 lb 11.2 oz (171.3 kg)    Physical Exam  General Appearance:    Severely obese male. Alert, cooperative, in no acute distress, appears stated age  Head:    Normocephalic,  without obvious abnormality, atraumatic  Eyes:    PERRL, conjunctiva/corneas clear, EOM's intact, fundi    benign, both eyes       Ears:    Normal TM's and external ear canals, both ears  Nose:   Nares normal, septum midline, mucosa normal, no drainage   or sinus tenderness  Throat:   Lips, mucosa, and tongue normal; teeth and gums normal  Neck:   Supple, symmetrical, trachea midline, no adenopathy;       thyroid:  No enlargement/tenderness/nodules; no carotid   bruit or JVD  Back:     Symmetric, no curvature, ROM normal, no CVA  tenderness  Lungs:     Clear to auscultation bilaterally, respirations unlabored  Chest wall:    No tenderness or deformity  Heart:    Normal heart rate. Normal rhythm. No murmurs, rubs, or gallops.  S1 and S2 normal  Abdomen:     Soft, non-tender, bowel sounds active all four quadrants,    no masses, no organomegaly  Genitalia:    deferred  Rectal:    deferred  Extremities:   All extremities are intact. No cyanosis or edema  Pulses:   2+ and symmetric all extremities  Skin:   Skin color, texture, turgor normal. Extensive solar keratoses across back. Continue regular follow up with dermatology  Lymph nodes:   Cervical, supraclavicular, and axillary nodes normal  Neurologic:   CNII-XII intact. Normal strength, sensation and reflexes      throughout      Assessment & Plan     1. Annual physical exam   2. Prostate cancer screening  - PSA  3. Essential (primary) hypertension Well controlled.  Continue current medications.   - Comprehensive metabolic panel  4. Pure hypercholesterolemia He is tolerating lovastatin well with no adverse effects.   - Lipid Panel With LDL/HDL Ratio - TSH  5. Prediabetes  - Hemoglobin A1c  6. Obesity, morbid (more than 100 lbs over ideal weight or BMI > 40) (HCC)   7. Status post placement of cardiac pacemaker   8. Acquired complete AV block (HCC) Asymptomatic. Compliant with medication.  Continue aggressive risk factor modification.  Has been followed by Dr. Nehemiah Massed who has retired, he is going to try to change to Dr. Saralyn Pilar.   9. Myasthenia gravis ([place]) Continue regular follow up with Dr. Manuella Ghazi  10. Prostatitis, unspecified prostatitis type Resolved.       The entirety of the information documented in the History of Present Illness, Review of Systems and Physical Exam were personally obtained by me. Portions of this information were initially documented by the CMA and reviewed by me for thoroughness and accuracy.     Lelon Huh, MD  [place] [phone] (phone) [phone] (fax)  [place]

## 2022-12-22 ENCOUNTER — Ambulatory Visit (INDEPENDENT_AMBULATORY_CARE_PROVIDER_SITE_OTHER): Payer: Medicare Other | Admitting: Family Medicine

## 2022-12-22 ENCOUNTER — Encounter: Payer: Self-pay | Admitting: Family Medicine

## 2022-12-22 VITALS — BP 132/71 | HR 74 | Temp 97.8°F | Resp 20 | Ht 72.0 in | Wt 377.8 lb

## 2022-12-22 DIAGNOSIS — E7800 Pure hypercholesterolemia, unspecified: Secondary | ICD-10-CM

## 2022-12-22 DIAGNOSIS — N419 Inflammatory disease of prostate, unspecified: Secondary | ICD-10-CM

## 2022-12-22 DIAGNOSIS — R7303 Prediabetes: Secondary | ICD-10-CM | POA: Diagnosis not present

## 2022-12-22 DIAGNOSIS — I10 Essential (primary) hypertension: Secondary | ICD-10-CM | POA: Diagnosis not present

## 2022-12-22 DIAGNOSIS — Z0000 Encounter for general adult medical examination without abnormal findings: Secondary | ICD-10-CM

## 2022-12-22 DIAGNOSIS — R739 Hyperglycemia, unspecified: Secondary | ICD-10-CM | POA: Insufficient documentation

## 2022-12-22 DIAGNOSIS — Z950 Presence of cardiac pacemaker: Secondary | ICD-10-CM

## 2022-12-22 DIAGNOSIS — Z125 Encounter for screening for malignant neoplasm of prostate: Secondary | ICD-10-CM

## 2022-12-22 DIAGNOSIS — G7000 Myasthenia gravis without (acute) exacerbation: Secondary | ICD-10-CM

## 2022-12-22 DIAGNOSIS — I442 Atrioventricular block, complete: Secondary | ICD-10-CM

## 2022-12-22 NOTE — Patient Instructions (Signed)
Please review the attached list of medications and notify my office if there are any errors.   You are due for a Tdap (tetanus-diptheria-pertussis vaccine) which protects you from tetanus and whooping cough. Please check with your insurance plan or pharmacy regarding coverage for this vaccine.   I recommend getting the updated Covid vaccine at your local pharmacy

## 2022-12-22 NOTE — Progress Notes (Signed)
Annual Wellness Visit     Patient: Garrett Woods, Male    DOB: 12/24/1955, 67 y.o.   MRN: [id number] Visit Date: 12/22/2022  Today's Provider: Lelon Huh, MD   Chief Complaint  Patient presents with   Medicare Wellness   Subjective    Garrett Woods is a 67 y.o. male who presents today for his Annual Wellness Visit.   Medications: Outpatient Medications Prior to Visit  Medication Sig   acetaminophen (TYLENOL) 500 MG tablet Take 1,000 mg by mouth every 6 (six) hours as needed for mild pain.   apixaban (ELIQUIS) 5 MG TABS tablet Take 5 mg by mouth 2 (two) times daily.   lovastatin (MEVACOR) 40 MG tablet TAKE 1 TABLET BY MOUTH EVERY DAY   oxymetazoline (AFRIN) 0.05 % nasal spray Place 1 spray into both nostrils 2 (two) times daily as needed for congestion.   valsartan-hydrochlorothiazide (DIOVAN-HCT) 160-25 MG tablet TAKE 1 TABLET BY MOUTH EVERY DAY   [DISCONTINUED] carboxymethylcellulose (REFRESH PLUS) 0.5 % SOLN Place 1 drop into both eyes 3 (three) times daily as needed (dry eyes).   [DISCONTINUED] hydrocortisone-pramoxine (ANALPRAM-HC) 2.5-1 % rectal cream 2 (two) times daily.   [DISCONTINUED] pramoxine-hydrocortisone (ANALPRAM HC) cream Apply topically 2 (two) times daily.   [DISCONTINUED] sucralfate (CARAFATE) 1 g tablet Take 1 tablet (1 g total) by mouth 4 (four) times daily -  with meals and at bedtime.   No facility-administered medications prior to visit.    Allergies  Allergen Reactions   Amoxicillin Rash    Patient Care Team: Birdie Sons, MD as PCP - General (Family Medicine) Christene Slates, MD (Dermatology) Alexis Goodell, MD as Consulting Physician (Neurology) Noreene Filbert, MD as Referring Physician (Radiation Oncology) Collier Flowers, MD as Referring Physician (Urology) Corey Skains, MD as Consulting Physician (Cardiology)        Objective     Most recent functional status assessment:    12/22/2022    8:18 AM  In  your present state of health, do you have any difficulty performing the following activities:  Hearing? 0  Vision? 0  Difficulty concentrating or making decisions? 0  Walking or climbing stairs? 1  Dressing or bathing? 0  Doing errands, shopping? 0   Most recent fall risk assessment:    12/22/2022    8:18 AM  Fall Risk   Falls in the past year? 0  Number falls in past yr: 0  Injury with Fall? 0  Risk for fall due to : No Fall Risks  Follow up Falls evaluation completed    Most recent depression screenings:    12/22/2022    8:18 AM 08/17/2022    9:21 AM  PHQ 2/9 Scores  PHQ - 2 Score 0 0  PHQ- 9 Score 0 3   Most recent cognitive screening:    12/22/2022    8:18 AM  6CIT Screen  What Year? 0 points  What month? 0 points  What time? 0 points  Count back from 20 0 points  Months in reverse 0 points  Repeat phrase 2 points  Total Score 2 points   Most recent Audit-C alcohol use screening    12/22/2022    8:18 AM  Alcohol Use Disorder Test (AUDIT)  1. How often do you have a drink containing alcohol? 1  2. How many drinks containing alcohol do you have on a typical day when you are drinking? 0  3. How often do you have six or  more drinks on one occasion? 0  AUDIT-C Score 1   A score of 3 or more in women, and 4 or more in men indicates increased risk for alcohol abuse, EXCEPT if all of the points are from question 1   No results found for any visits on 12/22/22.  Assessment & Plan     Annual wellness visit done today including the all of the following: Reviewed patient's Family Medical History Reviewed and updated list of patient's medical providers Assessment of cognitive impairment was done Assessed patient's functional ability Established a written schedule for health screening services Health Risk Assessent Completed and Reviewed  Exercise Activities and Dietary recommendations  Goals   None     Immunization History  Administered Date(s) Administered    Fluad Quad(high Dose 65+) 10/15/2019, 08/27/2020, 08/17/2022   Influenza, High Dose Seasonal PF 09/10/2021   Influenza,inj,Quad PF,6+ Mos 10/08/2016   PFIZER(Purple Top)SARS-COV-2 Vaccination 11/27/2019, 12/18/2019, 08/01/2020   PNEUMOCOCCAL CONJUGATE-20 10/30/2021   Pneumococcal Polysaccharide-23 10/28/2020   Tdap 03/20/2013   Zoster Recombinat (Shingrix) 06/22/2018, 08/22/2018    Health Maintenance  Topic Date Due   Hepatitis C Screening  Never done   COVID-19 Vaccine (4 - 2023-24 season) 07/02/2022   DTaP/Tdap/Td (2 - Td or Tdap) 03/21/2023   Medicare Annual Wellness (AWV)  12/23/2023   COLONOSCOPY (Pts 45-14yr Insurance coverage will need to be confirmed)  02/08/2027   Pneumonia Vaccine 69 Years old  Completed   INFLUENZA VACCINE  Completed   Zoster Vaccines- Shingrix  Completed   HPV VACCINES  Aged Out     Discussed health benefits of physical activity, and encouraged him to engage in regular exercise appropriate for his age and condition.      Return in about 1 year (around 12/23/2023) for Yearly Physical, Annual Wellness Visit.        DLelon Huh MD  [phone](phone) [phone](fax)  [place]

## 2022-12-23 LAB — COMPREHENSIVE METABOLIC PANEL
ALT: 26 IU/L (ref 0–44)
AST: 25 IU/L (ref 0–40)
Albumin/Globulin Ratio: 1.8 (ref 1.2–2.2)
Albumin: 4.8 g/dL (ref 3.9–4.9)
Alkaline Phosphatase: 72 IU/L (ref 44–121)
BUN/Creatinine Ratio: 15 (ref 10–24)
BUN: 18 mg/dL (ref 8–27)
Bilirubin Total: 0.5 mg/dL (ref 0.0–1.2)
CO2: 22 mmol/L (ref 20–29)
Calcium: 9.7 mg/dL (ref 8.6–10.2)
Chloride: 101 mmol/L (ref 96–106)
Creatinine, Ser: 1.17 mg/dL (ref 0.76–1.27)
Globulin, Total: 2.6 g/dL (ref 1.5–4.5)
Glucose: 133 mg/dL — ABNORMAL HIGH (ref 70–99)
Potassium: 4.5 mmol/L (ref 3.5–5.2)
Sodium: 140 mmol/L (ref 134–144)
Total Protein: 7.4 g/dL (ref 6.0–8.5)
eGFR: 68 mL/min/{1.73_m2} (ref >59)

## 2022-12-23 LAB — LIPID PANEL WITH LDL/HDL RATIO
Cholesterol, Total: 186 mg/dL (ref 100–199)
HDL: 38 mg/dL — ABNORMAL LOW (ref >39)
LDL Chol Calc (NIH): 125 mg/dL — ABNORMAL HIGH (ref 0–99)
LDL/HDL Ratio: 3.3 ratio (ref 0.0–3.6)
Triglycerides: 127 mg/dL (ref 0–149)
VLDL Cholesterol Cal: 23 mg/dL (ref 5–40)

## 2022-12-23 LAB — HEMOGLOBIN A1C
Est. average glucose Bld gHb Est-mCnc: 134 mg/dL
Hgb A1c MFr Bld: 6.3 % — ABNORMAL HIGH (ref 4.8–5.6)

## 2022-12-23 LAB — TSH: TSH: 3.04 u[IU]/mL (ref 0.450–4.500)

## 2022-12-23 LAB — PSA: Prostate Specific Ag, Serum: 0.5 ng/mL (ref 0.0–4.0)

## 2022-12-27 ENCOUNTER — Ambulatory Visit: Payer: Medicare Other | Admitting: Gastroenterology

## 2022-12-30 DIAGNOSIS — C44519 Basal cell carcinoma of skin of other part of trunk: Secondary | ICD-10-CM | POA: Diagnosis not present

## 2022-12-30 DIAGNOSIS — L920 Granuloma annulare: Secondary | ICD-10-CM | POA: Diagnosis not present

## 2023-01-10 DIAGNOSIS — C44719 Basal cell carcinoma of skin of left lower limb, including hip: Secondary | ICD-10-CM | POA: Diagnosis not present

## 2023-01-10 DIAGNOSIS — D492 Neoplasm of unspecified behavior of bone, soft tissue, and skin: Secondary | ICD-10-CM | POA: Diagnosis not present

## 2023-01-19 DIAGNOSIS — H43813 Vitreous degeneration, bilateral: Secondary | ICD-10-CM | POA: Diagnosis not present

## 2023-01-21 ENCOUNTER — Telehealth: Payer: Medicare Other | Admitting: Family Medicine

## 2023-01-21 DIAGNOSIS — J029 Acute pharyngitis, unspecified: Secondary | ICD-10-CM | POA: Diagnosis not present

## 2023-01-21 DIAGNOSIS — H9209 Otalgia, unspecified ear: Secondary | ICD-10-CM | POA: Diagnosis not present

## 2023-01-21 NOTE — Progress Notes (Signed)
E-Visit for Sore Throat  We are sorry that you are not feeling well.  Here is how we plan to help!  Your symptoms indicate a likely viral infection (Pharyngitis).   Pharyngitis is inflammation in the back of the throat which can cause a sore throat, scratchiness and sometimes difficulty swallowing.   Pharyngitis is typically caused by a respiratory virus and will just run its course.  Please keep in mind that your symptoms could last up to 10 days.  For throat pain, we recommend over the counter oral pain relief medications such as acetaminophen or aspirin, or anti-inflammatory medications such as ibuprofen or naproxen sodium.  Topical treatments such as oral throat lozenges or sprays may be used as needed.  Avoid close contact with loved ones, especially the very young and elderly.  Remember to wash your hands thoroughly throughout the day as this is the number one way to prevent the spread of infection and wipe down door knobs and counters with disinfectant.  After careful review of your answers, I would not recommend an antibiotic for your condition.  Antibiotics should not be used to treat conditions that we suspect are caused by viruses like the virus that causes the common cold or flu. However, some people can have Strep with atypical symptoms. You may need formal testing in clinic or office to confirm if your symptoms continue or worsen.  Providers prescribe antibiotics to treat infections caused by bacteria. Antibiotics are very powerful in treating bacterial infections when they are used properly.  To maintain their effectiveness, they should be used only when necessary.  Overuse of antibiotics has resulted in the development of super bugs that are resistant to treatment!    Given the ear pain you might also have some post nasal drainage and pressure changes- for this it is recommended that you use Flonase daily as directed on the bottle. It is allergy season and a lot of allergy symptoms look  like viral and bacterial ones.  Home Care: Only take medications as instructed by your medical team. Do not drink alcohol while taking these medications. A steam or ultrasonic humidifier can help congestion.  You can place a towel over your head and breathe in the steam from hot water coming from a faucet. Avoid close contacts especially the very young and the elderly. Cover your mouth when you cough or sneeze. Always remember to wash your hands.  Get Help Right Away If: You develop worsening fever or throat pain. You develop a severe head ache or visual changes. Your symptoms persist after you have completed your treatment plan.  Make sure you Understand these instructions. Will watch your condition. Will get help right away if you are not doing well or get worse.   Thank you for choosing an e-visit.  Your e-visit answers were reviewed by a board certified advanced clinical practitioner to complete your personal care plan. Depending upon the condition, your plan could have included both over the counter or prescription medications.  Please review your pharmacy choice. Make sure the pharmacy is open so you can pick up prescription now. If there is a problem, you may contact your provider through [employer] and have the prescription routed to another pharmacy.  Your safety is important to [place]. If you have drug allergies check your prescription carefully.   For the next 24 hours you can use MyChart to ask questions about today's visit, request a non-urgent call back, or ask for a work or school excuse. You  will get an email in the next two days asking about your experience. I hope that your e-visit has been valuable and will speed your recovery.  I provided 5 minutes of non face-to-face time during this encounter for chart review, medication and order placement, as well as and documentation.

## 2023-02-10 DIAGNOSIS — C44719 Basal cell carcinoma of skin of left lower limb, including hip: Secondary | ICD-10-CM | POA: Diagnosis not present

## 2023-02-19 ENCOUNTER — Other Ambulatory Visit: Payer: Self-pay | Admitting: Family Medicine

## 2023-03-02 ENCOUNTER — Telehealth (INDEPENDENT_AMBULATORY_CARE_PROVIDER_SITE_OTHER): Payer: Medicare Other | Admitting: Family Medicine

## 2023-03-02 ENCOUNTER — Encounter: Payer: Self-pay | Admitting: Family Medicine

## 2023-03-02 ENCOUNTER — Ambulatory Visit: Payer: Self-pay | Admitting: *Deleted

## 2023-03-02 DIAGNOSIS — U071 COVID-19: Secondary | ICD-10-CM | POA: Diagnosis not present

## 2023-03-02 MED ORDER — NIRMATRELVIR/RITONAVIR (PAXLOVID)TABLET: 3.0000 | ORAL_TABLET | Freq: Two times a day (BID) | ORAL | 0 refills | Status: AC

## 2023-03-02 NOTE — Telephone Encounter (Signed)
Message from Rufus sent at 03/02/2023 11:21 AM EDT  Summary: Pt tested positive for Covid and has questions about medications   Pt reports that he tested positive for Covid this morning and would like to know if there are any medications that he needs to be taking. Cb# [phone]          Call History   Type Contact Phone/Fax User  03/02/2023 11:19 AM EDT Phone (Incoming) Cleaster Corin, Santiago Bumpers "Bill" (Self) [phone] Rita Ohara, Jannifer Rodney   Reason for Disposition  [1] HIGH RISK patient (e.g., weak immune system, age > 64 years, obesity with BMI 30 or higher, pregnant, chronic lung disease or other chronic medical condition) AND [2] COVID symptoms (e.g., cough, fever)  (Exceptions: Already seen by PCP and no new or worsening symptoms.)  Answer Assessment - Initial Assessment Questions 1. COVID-19 DIAGNOSIS: "How do you know that you have COVID?" (e.g., positive lab test or self-test, diagnosed by doctor or NP/PA, symptoms after exposure).     Positive for Covid this morning Congestion  2. COVID-19 EXPOSURE: "Was there any known exposure to COVID before the symptoms began?" CDC Definition of close contact: within 6 feet (2 meters) for a total of 15 minutes or more over a 24-hour period.      Family reunion  46. ONSET: "When did the COVID-19 symptoms start?"      Started with a scratchy throat on Mon and has gotten worse.  Nasal congestion, coughing a little mucus green, no fever.   Yesterday had diarrhea but not today.   No vomiting.    4. WORST SYMPTOM: "What is your worst symptom?" (e.g., cough, fever, shortness of breath, muscle aches)     No chest pain, my chest in congestion with a little shortness of breath.   5. COUGH: "Do you have a cough?" If Yes, ask: "How bad is the cough?"       Yes 6. FEVER: "Do you have a fever?" If Yes, ask: "What is your temperature, how was it measured, and when did it start?"     No fever 7. RESPIRATORY STATUS: "Describe your breathing?" (e.g.,  normal; shortness of breath, wheezing, unable to speak)      A little short of breath  8. BETTER-SAME-WORSE: "Are you getting better, staying the same or getting worse compared to yesterday?"  If getting worse, ask, "In what way?"     same 9. OTHER SYMPTOMS: "Do you have any other symptoms?"  (e.g., chills, fatigue, headache, loss of smell or taste, muscle pain, sore throat)     Scratchy throat, nasal congestion, coughing. 10. HIGH RISK DISEASE: "Do you have any chronic medical problems?" (e.g., asthma, heart or lung disease, weak immune system, obesity, etc.)       Yes A. Fib     11. VACCINE: "Have you had the COVID-19 vaccine?" If Yes, ask: "Which one, how many shots, when did you get it?"       Not asked 12. PREGNANCY: "Is there any chance you are pregnant?" "When was your last menstrual period?"       N/A 13. O2 SATURATION MONITOR:  "Do you use an oxygen saturation monitor (pulse oximeter) at home?" If Yes, ask "What is your reading (oxygen level) today?" "What is your usual oxygen saturation reading?" (e.g., 95%)       N/A  Protocols used: Coronavirus (COVID-19) Diagnosed or Suspected-A-AH

## 2023-03-02 NOTE — Patient Instructions (Addendum)
Potential Paxlovid side effects include but are not limited to altered sense of taste, loss of taste, diarrhea and myalgias (muscle aches).  Medication ADJUSTMENTS: - Take a HALF TABLET OF ELIQUIS twice daily while taking paxlovid. May restart full dose the day after completion of Paxlovid. - DISCONTINUE use of LOVASTATIN at least 12 hours before, during, and 5 days after treatment with Paxlovid.  - Acetaminophen - try minimize use  - Valsartan/HCTZ - may continue to take - oxymetazoline - may continue to take  Do not take additional Coricidin HBP while taking Paxlovid.

## 2023-03-02 NOTE — Progress Notes (Signed)
Vivien Rota DeSanto,acting as a Neurosurgeon for [employer], DO.,have documented all relevant documentation on the behalf of [employer], DO,as directed by  [employer], DO while in the presence of Davonte Siebenaler N Shakendra Griffeth, DO.   MyChart Video Visit    Virtual Visit via Video Note   This format is felt to be most appropriate for this patient at this time. Physical exam was limited by quality of the video and audio technology used for the visit.   Patient location: home Provider location: office  I discussed the limitations of evaluation and management by telemedicine and the availability of in person appointments. The patient expressed understanding and agreed to proceed.  Patient: Garrett Woods   DOB: 05/07/1956   67 y.o. Male  MRN: [id number] Visit Date: 03/02/2023  Today's healthcare provider: Sherlyn Hay, DO   Chief Complaint  Patient presents with   Covid Positive   Subjective    HPI  Patient is a 67 year old male who presents via video visit for evaluation after having tested positive for Covid-19.  He states he first started having symptoms on 02/28/23.  He tested negative for Covid on 2 occasions and then positive today. He has been taking Coricidin HBP OTC to manage his symptoms but notes they have been becoming worse.    Medications: Outpatient Medications Prior to Visit  Medication Sig   acetaminophen (TYLENOL) 500 MG tablet Take 1,000 mg by mouth every 6 (six) hours as needed for mild pain.   apixaban (ELIQUIS) 5 MG TABS tablet Take 5 mg by mouth 2 (two) times daily.   lovastatin (MEVACOR) 40 MG tablet TAKE 1 TABLET BY MOUTH EVERY DAY   oxymetazoline (AFRIN) 0.05 % nasal spray Place 1 spray into both nostrils 2 (two) times daily as needed for congestion.   valsartan-hydrochlorothiazide (DIOVAN-HCT) 160-25 MG tablet TAKE 1 TABLET BY MOUTH EVERY DAY   No facility-administered medications prior to visit.    Review of Systems  Constitutional:  Positive for activity  change, appetite change, diaphoresis, fatigue and fever. Negative for chills.  HENT:  Positive for congestion, postnasal drip, sinus pressure, sneezing and sore throat (scratchy). Negative for ear discharge, ear pain, hearing loss, nosebleeds, rhinorrhea, sinus pain, tinnitus and trouble swallowing.   Eyes:  Negative for photophobia, pain, discharge, redness, itching and visual disturbance.  Respiratory:  Positive for cough (mostly non productive) and chest tightness. Negative for shortness of breath and wheezing.   Cardiovascular:  Negative for chest pain.  Gastrointestinal:  Positive for diarrhea and nausea. Negative for abdominal pain, constipation and vomiting.  Musculoskeletal:  Positive for myalgias.  Neurological:  Positive for headaches. Negative for dizziness and light-headedness.       Objective    There were no vitals taken for this visit.     Physical Exam Constitutional:      Appearance: He is obese.  Pulmonary:     Comments: Mildly short of breath while talking but able to speak in complete sentences without abnormal pauses. Neurological:     Mental Status: He is alert and oriented to person, place, and time.  Psychiatric:        Mood and Affect: Mood normal.        Behavior: Behavior normal.        Assessment & Plan     1. COVID-19 with multiple comorbidities The patient is comorbidities of atrial fibrillation on anticoagulation with Eliquis, myasthenia gravis, hypertension, and dyspnea on exertion at baseline,  will go ahead and prescribe Paxlovid as noted below.  - nirmatrelvir/ritonavir (PAXLOVID) 20 x 150 MG & 10 x 100MG  TABS; Take 3 tablets by mouth 2 (two) times daily for 5 days. (Take nirmatrelvir 150 mg two tablets twice daily for 5 days and ritonavir 100 mg one tablet twice daily for 5 days) Patient GFR is 68  Dispense: 30 tablet; Refill: 0  Reviewed patient's medications and gave him instructions for dosage adjustments. Instructed patient to avoid  taking his lovastatin while taking paxlovid and to hold it for an additional 5 additional days thereafter. Also instructed patient to cut his apixaban dose in half and take only a half tablet twice daily, rather than a full tablet twice daily.  Also discussed minimizing acetaminophen use, refraining from using Coricidin HBP, and that he may continue to take his valsartan/hydrochlorothiazide changed.  Included this information in the AVS, which patient states he is able to access without problem. Patient expressed understanding of instructions.  His wife lives with him and is available to help him if he runs into any problems.  Patient is aware to call the office if he experiences any symptoms including but not limited to worsening shortness of breath, worsening diarrhea, chest pain, abdominal pain and vomiting.  He is aware that, if any symptoms he experiences seem highly concerning to him, he should proceed immediately to the emergency department.  No follow-ups on file.     I discussed the assessment and treatment plan with the patient. The patient was provided an opportunity to ask questions and all were answered. The patient agreed with the plan and demonstrated an understanding of the instructions.   The patient was advised to call back or seek an in-person evaluation if the symptoms worsen or if the condition fails to improve as anticipated.  I provided 25 minutes of non-face-to-face time during this encounter.  The entirety of the information documented in the History of Present Illness, Review of Systems and Physical Exam were personally obtained by me. Portions of this information were initially documented by the CMA, Adline Peals, and reviewed by me for thoroughness and accuracy.    Sherlyn Hay, DO [facility] [phone] (phone) [phone] (fax)  [facility]

## 2023-03-02 NOTE — Telephone Encounter (Signed)
  Chief Complaint: Positive for Covid.   Wanting to know about an antiviral since he is high risk Symptoms: nasal congestion, cough with occasional green mucus being coughed up, chest congestion with mild shortness of breath, no chest pain.  Started with a scratchy throat.   Had diarrhea yesterday but not today Frequency: Started Monday with his symptoms Pertinent Negatives: Patient denies body aches or fever.   No vomiting or diarrhea.    Disposition: [] ED /[] Urgent Care (no appt availability in office) / [x] Appointment(In office/virtual)/ []  [place] [facility]/ [] Home Care/ [] Refused Recommended Disposition /[] [facility]/ []  Follow-up with PCP Additional Notes: Virtual visit scheduled with Dr. Jacquenette Shone for today at 1:20.

## 2023-06-30 DIAGNOSIS — C44212 Basal cell carcinoma of skin of right ear and external auricular canal: Secondary | ICD-10-CM | POA: Diagnosis not present

## 2023-06-30 DIAGNOSIS — L578 Other skin changes due to chronic exposure to nonionizing radiation: Secondary | ICD-10-CM | POA: Diagnosis not present

## 2023-06-30 DIAGNOSIS — L570 Actinic keratosis: Secondary | ICD-10-CM | POA: Diagnosis not present

## 2023-06-30 DIAGNOSIS — D492 Neoplasm of unspecified behavior of bone, soft tissue, and skin: Secondary | ICD-10-CM | POA: Diagnosis not present

## 2023-06-30 DIAGNOSIS — C44719 Basal cell carcinoma of skin of left lower limb, including hip: Secondary | ICD-10-CM | POA: Diagnosis not present

## 2023-06-30 DIAGNOSIS — C44722 Squamous cell carcinoma of skin of right lower limb, including hip: Secondary | ICD-10-CM | POA: Diagnosis not present

## 2023-07-06 DIAGNOSIS — H43813 Vitreous degeneration, bilateral: Secondary | ICD-10-CM | POA: Diagnosis not present

## 2023-07-06 DIAGNOSIS — G7000 Myasthenia gravis without (acute) exacerbation: Secondary | ICD-10-CM | POA: Diagnosis not present

## 2023-08-10 DIAGNOSIS — R200 Anesthesia of skin: Secondary | ICD-10-CM | POA: Diagnosis not present

## 2023-08-10 DIAGNOSIS — R7309 Other abnormal glucose: Secondary | ICD-10-CM | POA: Diagnosis not present

## 2023-08-10 DIAGNOSIS — G7000 Myasthenia gravis without (acute) exacerbation: Secondary | ICD-10-CM | POA: Diagnosis not present

## 2023-08-10 DIAGNOSIS — M21371 Foot drop, right foot: Secondary | ICD-10-CM | POA: Diagnosis not present

## 2023-08-22 DIAGNOSIS — I350 Nonrheumatic aortic (valve) stenosis: Secondary | ICD-10-CM | POA: Diagnosis not present

## 2023-08-22 DIAGNOSIS — I482 Chronic atrial fibrillation, unspecified: Secondary | ICD-10-CM | POA: Diagnosis not present

## 2023-08-22 DIAGNOSIS — E782 Mixed hyperlipidemia: Secondary | ICD-10-CM | POA: Diagnosis not present

## 2023-08-22 DIAGNOSIS — I442 Atrioventricular block, complete: Secondary | ICD-10-CM | POA: Diagnosis not present

## 2023-08-22 DIAGNOSIS — I10 Essential (primary) hypertension: Secondary | ICD-10-CM | POA: Diagnosis not present

## 2023-08-22 DIAGNOSIS — I4819 Other persistent atrial fibrillation: Secondary | ICD-10-CM | POA: Diagnosis not present

## 2023-08-22 DIAGNOSIS — Z950 Presence of cardiac pacemaker: Secondary | ICD-10-CM | POA: Diagnosis not present

## 2023-08-22 DIAGNOSIS — I429 Cardiomyopathy, unspecified: Secondary | ICD-10-CM | POA: Diagnosis not present

## 2023-08-25 DIAGNOSIS — M1712 Unilateral primary osteoarthritis, left knee: Secondary | ICD-10-CM | POA: Diagnosis not present

## 2023-09-01 DIAGNOSIS — L570 Actinic keratosis: Secondary | ICD-10-CM | POA: Diagnosis not present

## 2023-09-01 DIAGNOSIS — C44722 Squamous cell carcinoma of skin of right lower limb, including hip: Secondary | ICD-10-CM | POA: Diagnosis not present

## 2023-09-07 DIAGNOSIS — I4819 Other persistent atrial fibrillation: Secondary | ICD-10-CM | POA: Diagnosis not present

## 2023-09-07 DIAGNOSIS — I4729 Other ventricular tachycardia: Secondary | ICD-10-CM | POA: Diagnosis not present

## 2023-10-06 DIAGNOSIS — C44722 Squamous cell carcinoma of skin of right lower limb, including hip: Secondary | ICD-10-CM | POA: Diagnosis not present

## 2023-10-06 DIAGNOSIS — D492 Neoplasm of unspecified behavior of bone, soft tissue, and skin: Secondary | ICD-10-CM | POA: Diagnosis not present

## 2023-10-31 DIAGNOSIS — D492 Neoplasm of unspecified behavior of bone, soft tissue, and skin: Secondary | ICD-10-CM | POA: Diagnosis not present

## 2023-10-31 DIAGNOSIS — L988 Other specified disorders of the skin and subcutaneous tissue: Secondary | ICD-10-CM | POA: Diagnosis not present

## 2023-10-31 DIAGNOSIS — C44722 Squamous cell carcinoma of skin of right lower limb, including hip: Secondary | ICD-10-CM | POA: Diagnosis not present

## 2023-12-30 ENCOUNTER — Ambulatory Visit: Payer: Medicare Other | Admitting: Family Medicine

## 2023-12-30 ENCOUNTER — Encounter: Payer: Self-pay | Admitting: Family Medicine

## 2023-12-30 VITALS — BP 127/75 | HR 64 | Resp 17 | Ht 72.0 in | Wt 379.0 lb

## 2023-12-30 DIAGNOSIS — Z23 Encounter for immunization: Secondary | ICD-10-CM | POA: Diagnosis not present

## 2023-12-30 DIAGNOSIS — Z1159 Encounter for screening for other viral diseases: Secondary | ICD-10-CM

## 2023-12-30 DIAGNOSIS — R739 Hyperglycemia, unspecified: Secondary | ICD-10-CM | POA: Diagnosis not present

## 2023-12-30 DIAGNOSIS — I442 Atrioventricular block, complete: Secondary | ICD-10-CM

## 2023-12-30 DIAGNOSIS — G7000 Myasthenia gravis without (acute) exacerbation: Secondary | ICD-10-CM

## 2023-12-30 DIAGNOSIS — Z950 Presence of cardiac pacemaker: Secondary | ICD-10-CM

## 2023-12-30 DIAGNOSIS — I10 Essential (primary) hypertension: Secondary | ICD-10-CM | POA: Diagnosis not present

## 2023-12-30 DIAGNOSIS — E7800 Pure hypercholesterolemia, unspecified: Secondary | ICD-10-CM | POA: Diagnosis not present

## 2023-12-30 DIAGNOSIS — Z0000 Encounter for general adult medical examination without abnormal findings: Secondary | ICD-10-CM | POA: Diagnosis not present

## 2023-12-30 DIAGNOSIS — Z125 Encounter for screening for malignant neoplasm of prostate: Secondary | ICD-10-CM | POA: Diagnosis not present

## 2023-12-30 DIAGNOSIS — E538 Deficiency of other specified B group vitamins: Secondary | ICD-10-CM | POA: Diagnosis not present

## 2023-12-30 DIAGNOSIS — E559 Vitamin D deficiency, unspecified: Secondary | ICD-10-CM

## 2023-12-30 MED ORDER — TETANUS-DIPHTH-ACELL PERTUSSIS 5-2.5-18.5 LF-MCG/0.5 IM SUSY: 0.5000 mL | PREFILLED_SYRINGE | Freq: Once | INTRAMUSCULAR | 0 refills | Status: AC

## 2023-12-30 NOTE — Patient Instructions (Addendum)
 Please review the attached list of medications and notify my office if there are any errors.   You are due for a Tdap (tetanus-diptheria-pertussis vaccine) which protects you from tetanus and whooping cough. Please check with your insurance plan or pharmacy regarding coverage for this vaccine.   Check with your insurance formulary to see if Wegovy or Zepbound are covered

## 2023-12-31 LAB — PSA TOTAL (REFLEX TO FREE): Prostate Specific Ag, Serum: 0.3 ng/mL (ref 0.0–4.0)

## 2023-12-31 LAB — COMPREHENSIVE METABOLIC PANEL
ALT: 31 IU/L (ref 0–44)
AST: 30 IU/L (ref 0–40)
Albumin: 4.4 g/dL (ref 3.9–4.9)
Alkaline Phosphatase: 63 IU/L (ref 44–121)
BUN/Creatinine Ratio: 20 (ref 10–24)
BUN: 24 mg/dL (ref 8–27)
Bilirubin Total: 0.6 mg/dL (ref 0.0–1.2)
CO2: 19 mmol/L — ABNORMAL LOW (ref 20–29)
Calcium: 9.6 mg/dL (ref 8.6–10.2)
Chloride: 104 mmol/L (ref 96–106)
Creatinine, Ser: 1.21 mg/dL (ref 0.76–1.27)
Globulin, Total: 2.5 g/dL (ref 1.5–4.5)
Glucose: 136 mg/dL — ABNORMAL HIGH (ref 70–99)
Potassium: 4.6 mmol/L (ref 3.5–5.2)
Sodium: 140 mmol/L (ref 134–144)
Total Protein: 6.9 g/dL (ref 6.0–8.5)
eGFR: 65 mL/min/{1.73_m2} (ref >59)

## 2023-12-31 LAB — CBC
Hematocrit: 41.1 % (ref 37.5–51.0)
Hemoglobin: 13.8 g/dL (ref 13.0–17.7)
MCH: 32.9 pg (ref 26.6–33.0)
MCHC: 33.6 g/dL (ref 31.5–35.7)
MCV: 98 fL — ABNORMAL HIGH (ref 79–97)
Platelets: 208 10*3/uL (ref 150–450)
RBC: 4.2 x10E6/uL (ref 4.14–5.80)
RDW: 13.7 % (ref 11.6–15.4)
WBC: 9.1 10*3/uL (ref 3.4–10.8)

## 2023-12-31 LAB — VITAMIN B12: Vitamin B-12: 475 pg/mL (ref 232–1245)

## 2023-12-31 LAB — LIPID PANEL
Chol/HDL Ratio: 4.5 ratio (ref 0.0–5.0)
Cholesterol, Total: 154 mg/dL (ref 100–199)
HDL: 34 mg/dL — ABNORMAL LOW (ref >39)
LDL Chol Calc (NIH): 89 mg/dL (ref 0–99)
Triglycerides: 179 mg/dL — ABNORMAL HIGH (ref 0–149)
VLDL Cholesterol Cal: 31 mg/dL (ref 5–40)

## 2023-12-31 LAB — HEPATITIS C ANTIBODY: Hep C Virus Ab: NONREACTIVE

## 2023-12-31 LAB — HEMOGLOBIN A1C
Est. average glucose Bld gHb Est-mCnc: 128 mg/dL
Hgb A1c MFr Bld: 6.1 % — ABNORMAL HIGH (ref 4.8–5.6)

## 2023-12-31 LAB — VITAMIN D 25 HYDROXY (VIT D DEFICIENCY, FRACTURES): Vit D, 25-Hydroxy: 26 ng/mL — ABNORMAL LOW (ref 30.0–100.0)

## 2024-01-04 ENCOUNTER — Encounter: Payer: Self-pay | Admitting: Family Medicine

## 2024-01-12 DIAGNOSIS — L814 Other melanin hyperpigmentation: Secondary | ICD-10-CM | POA: Diagnosis not present

## 2024-01-12 DIAGNOSIS — C44612 Basal cell carcinoma of skin of right upper limb, including shoulder: Secondary | ICD-10-CM | POA: Diagnosis not present

## 2024-01-12 DIAGNOSIS — L570 Actinic keratosis: Secondary | ICD-10-CM | POA: Diagnosis not present

## 2024-01-12 DIAGNOSIS — D0462 Carcinoma in situ of skin of left upper limb, including shoulder: Secondary | ICD-10-CM | POA: Diagnosis not present

## 2024-01-12 DIAGNOSIS — D492 Neoplasm of unspecified behavior of bone, soft tissue, and skin: Secondary | ICD-10-CM | POA: Diagnosis not present

## 2024-01-12 DIAGNOSIS — L905 Scar conditions and fibrosis of skin: Secondary | ICD-10-CM | POA: Diagnosis not present

## 2024-01-12 DIAGNOSIS — L821 Other seborrheic keratosis: Secondary | ICD-10-CM | POA: Diagnosis not present

## 2024-01-20 NOTE — Progress Notes (Signed)
 Annual Wellness Visit     Patient: Garrett Woods, Male    DOB: 03-23-1956, 68 y.o.   MRN: [id number] Visit Date: 12/30/2023  Today's Provider: Mila Merry, MD    Subjective    San Jetty is a 68 y.o. male who presents today for his Annual Wellness Visit.   Medications: Outpatient Medications Prior to Visit  Medication Sig   acetaminophen (TYLENOL) 500 MG tablet Take 1,000 mg by mouth every 6 (six) hours as needed for mild pain.   apixaban (ELIQUIS) 5 MG TABS tablet Take 5 mg by mouth 2 (two) times daily.   buPROPion ER (WELLBUTRIN SR) 100 MG 12 hr tablet Take 100 mg by mouth 2 (two) times daily.   cholecalciferol (VITAMIN D3) 25 MCG (1000 UNIT) tablet Take 1,000 Units by mouth daily.   lovastatin (MEVACOR) 40 MG tablet TAKE 1 TABLET BY MOUTH EVERY DAY   metoprolol succinate (TOPROL-XL) 50 MG 24 hr tablet Take 50 mg by mouth daily.   oxymetazoline (AFRIN) 0.05 % nasal spray Place 1 spray into both nostrils 2 (two) times daily as needed for congestion.   valsartan-hydrochlorothiazide (DIOVAN-HCT) 160-25 MG tablet TAKE 1 TABLET BY MOUTH EVERY DAY   No facility-administered medications prior to visit.    Allergies  Allergen Reactions   Amoxicillin Rash    Patient Care Team: Malva Limes, MD as PCP - General (Family Medicine) Nancy Marus, MD (Dermatology) Thana Farr, MD as Consulting Physician (Neurology) Carmina Miller, MD as Referring Physician (Radiation Oncology) Lorraine Lax, MD as Referring Physician (Urology) Corky Sing Meryl Dare, MD as Consulting Physician (Cardiology) Lonell Face, MD as Consulting Physician (Neurology)    Objective     Most recent functional status assessment:    12/30/2023    8:21 AM  In your present state of health, do you have any difficulty performing the following activities:  Hearing? 0  Vision? 0  Difficulty concentrating or making decisions? 0  Walking or climbing stairs? 1  Dressing or bathing? 0   Doing errands, shopping? 0   Most recent fall risk assessment:    12/30/2023    8:22 AM  Fall Risk   Falls in the past year? 0  Number falls in past yr: 0  Injury with Fall? 0  Risk for fall due to : No Fall Risks    Most recent depression screenings:    12/30/2023    8:22 AM 12/22/2022    8:18 AM  PHQ 2/9 Scores  PHQ - 2 Score 0 0  PHQ- 9 Score 1 0   Most recent cognitive screening:    12/30/2023    8:23 AM  6CIT Screen  What Year? 0 points  What month? 0 points  What time? 0 points  Count back from 20 0 points  Months in reverse 0 points  Repeat phrase 2 points  Total Score 2 points   Most recent Audit-C alcohol use screening    12/30/2023    8:12 AM  Alcohol Use Disorder Test (AUDIT)  1. How often do you have a drink containing alcohol? 1  2. How many drinks containing alcohol do you have on a typical day when you are drinking? 0  3. How often do you have six or more drinks on one occasion? 0  AUDIT-C Score 1   A score of 3 or more in women, and 4 or more in men indicates increased risk for alcohol abuse, EXCEPT if all of the  points are from question 1     Assessment & Plan     Annual wellness visit done today including the all of the following: Reviewed patient's Family Medical History Reviewed and updated list of patient's medical providers Assessment of cognitive impairment was done Assessed patient's functional ability Established a written schedule for health screening services Health Risk Assessent Completed and Reviewed  Exercise Activities and Dietary recommendations  Goals   None     Immunization History  Administered Date(s) Administered   Fluad Quad(high Dose 65+) 10/15/2019, 08/27/2020, 08/17/2022   Fluad Trivalent(High Dose 65+) 12/30/2023   Influenza, High Dose Seasonal PF 09/10/2021   Influenza,inj,Quad PF,6+ Mos 10/08/2016   PFIZER(Purple Top)SARS-COV-2 Vaccination 11/27/2019, 12/18/2019, 08/01/2020   PNEUMOCOCCAL CONJUGATE-20  10/30/2021   Pneumococcal Polysaccharide-23 10/28/2020   Tdap 03/20/2013   Zoster Recombinant(Shingrix) 06/22/2018, 08/22/2018    Health Maintenance  Topic Date Due   DTaP/Tdap/Td (2 - Td or Tdap) 03/21/2023   COVID-19 Vaccine (4 - 2024-25 season) 07/03/2023   Medicare Annual Wellness (AWV)  12/29/2024   Colonoscopy  02/08/2027   Pneumonia Vaccine 63+ Years old  Completed   INFLUENZA VACCINE  Completed   Hepatitis C Screening  Completed   Zoster Vaccines- Shingrix  Completed   HPV VACCINES  Aged Out     Discussed health benefits of physical activity, and encouraged him to engage in regular exercise appropriate for his age and condition.           Mila Merry, MD  [facility] [phone] (phone) [phone] (fax)  [facility]

## 2024-01-20 NOTE — Progress Notes (Signed)
 Complete physical exam   Patient: Garrett Woods   DOB: 1955/11/10   68 y.o. Male  MRN: [id number] Visit Date: 12/30/2023  Today's healthcare provider: Mila Merry, MD    Subjective    Garrett Woods is a 68 y.o. male who presents today for a complete physical exam and follow up multiple chronic medication problems.  He reports consuming a  regular  diet.  He generally feels fairly well. He reports sleeping fairly well. He does not have additional problems to discuss today.   Garrett Jetty "Annette Stable" is a 68 year old male with atrial fibrillation and myasthenia gravis who presents for a routine follow-up.  He is currently taking Eliquis for atrial fibrillation and has an upcoming appointment in April for an echocardiogram with his new cardiologist. His pacemaker check revealed five instances of high heart rate lasting a second or two, leading to the prescription of metoprolol to prevent these episodes.  He is under the care of Dr. Sherryll Burger for myasthenia gravis and neuropathy. He was prescribed bupropion to aid in weight loss. He monitors his blood pressure at home, which is generally stable, rarely exceeding 140/80 mmHg, though it was notably low this morning.  His A1c was slightly elevated in the prediabetes range during blood work done in October. He is attempting to manage this through improved dietary habits.  He has a history of vitamin D deficiency and completed a course of 50,000 units for four to six weeks, followed by a daily intake of 1,000 mg. He has not had his levels checked since completing the course.  He is currently taking lovastatin for cholesterol and has received his flu shot.  He has a basal cell carcinoma on his ear and is scheduled for Mohs surgery in April. 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  Routine health maintenance includes vaccinations and screenings. He is due for a tetanus booster and has had a flu shot. Colonoscopy was  clear in 2018 with a 10-year follow-up interval.  No significant swelling in his hands, feet, or ankles.       Past Medical History:  Diagnosis Date   Allergy    Amoxicillin   Arthritis    Cataract 2022   Foot drop, right    since spinal fusion 8/17   Gout    Heart murmur    History of chicken pox    Hyperlipidemia    Hypertension    Microscopic hematuria    MRSA (methicillin resistant staph aureus) culture positive    Nasal culture. Prior to Back surgery. Used "ointment"   Myasthenia gravis [facility])    Pacemaker    medtronic [id number] Advisa   Prostate cancer [facility])    Radiation   Past Surgical History:  Procedure Laterality Date   BACK SURGERY  06/2016   lumbar fusion   BASAL CELL CARCINOMA EXCISION  12/28/2012   Anterior neck, done by Dr. Adriana Simas at [facility]   CARPAL TUNNEL RELEASE Left    CATARACT EXTRACTION W/PHACO Right 04/20/2021   Procedure: CATARACT EXTRACTION PHACO AND INTRAOCULAR LENS PLACEMENT (IOC) RIGHT;  Surgeon: Nevada Crane, MD;  Location: [facility];  Service: Ophthalmology;  Laterality: Right;  3.30 0:25.1   CATARACT EXTRACTION W/PHACO Left 05/11/2021   Procedure: CATARACT EXTRACTION PHACO AND INTRAOCULAR LENS PLACEMENT (IOC) LEFT 2.45 00:22.5;  Surgeon: Nevada Crane, MD;  Location: [facility];  Service: Ophthalmology;  Laterality: Left;   COLONOSCOPY W/ POLYPECTOMY  COLONOSCOPY WITH PROPOFOL N/A 02/07/2017   Procedure: COLONOSCOPY WITH PROPOFOL;  Surgeon: Midge Minium, MD;  Location: [facility];  Service: Endoscopy;  Laterality: N/A;   EYE SURGERY  July 2022   Cataracts   INSERT / REPLACE / REMOVE PACEMAKER  12/24/2014   Medtronic   JOINT REPLACEMENT  December 2017   Knee   KNEE SURGERY Right 1988   Tear of LCL of knee   MELANOMA EXCISION  2013,2015   X 2; Neck & Back   MOHS SURGERY Left    BCC left leg 11/11/2021 [facility]   PPM GENERATOR CHANGEOUT N/A 08/04/2021   Procedure: PPM GENERATOR CHANGEOUT;  Surgeon: Marcina Millard, MD;  Location: [facility] INVAS[facility] CV [facility];  Service: Cardiovascular;  Laterality: N/A;   SPINE SURGERY  August 2017   Fusion   TOTAL KNEE ARTHROPLASTY Right 10/05/2016   Procedure: TOTAL KNEE ARTHROPLASTY;  Surgeon: Kennedy Bucker, MD;  Location: [facility] ORS;  Service: Orthopedics;  Laterality: Right;  +MRSA PCR    Social History   Socioeconomic History   Marital status: Married    Spouse name: Not on file   Number of children: Not on file   Years of education: Not on file   Highest education level: Some college, no degree  Occupational History   Occupation: [employer]    Comment: VP dinning services  Tobacco Use   Smoking status: Former    Current packs/day: 0.00    Average packs/day: 0.5 packs/day for 15.0 years (7.5 ttl pk-yrs)    Types: Cigarettes    Quit date: 11/02/1983    Years since quitting: 40.2   Smokeless tobacco: Never  Vaping Use   Vaping status: Never Used  Substance and Sexual Activity   Alcohol use: Yes    Alcohol/week: 1.0 standard drink of alcohol    Types: 1 Standard drinks or equivalent per week    Comment: 2 drinks every weekend   Drug use: No   Sexual activity: Yes    Birth control/protection: Post-menopausal  Other Topics Concern   Not on file  Social History Narrative   Not on file   Social Drivers of Health   Financial Resource Strain: Low Risk  (12/30/2023)   Overall Financial Resource Strain (CARDIA)    Difficulty of Paying Living Expenses: Not hard at all  Food Insecurity: No Food Insecurity (12/30/2023)   Hunger Vital Sign    Worried About Running Out of Food in the Last Year: Never true    Ran Out of Food in the Last Year: Never true  Transportation Needs: No Transportation Needs (12/30/2023)   PRAPARE - Transportation    Lack of Transportation (Medical): No    Lack of Transportation (Non-Medical): No  Physical Activity: Inactive (12/30/2023)   Exercise Vital Sign    Days of Exercise per Week: 0 days    Minutes of Exercise per Session: 0  min  Stress: No Stress Concern Present (12/26/2023)   [employer] of Occupational Health - Occupational Stress Questionnaire    Feeling of Stress : Not at all  Social Connections: Socially Integrated (12/30/2023)   Social Connection and Isolation Panel [NHANES]    Frequency of Communication with Friends and Family: Three times a week    Frequency of Social Gatherings with Friends and Family: Once a week    Attends Religious Services: 1 to 4 times per year    Active Member of [employer] or Organizations: Yes    Attends Banker Meetings: Patient declined  Marital Status: Married  Catering manager Violence: Patient Declined (12/30/2023)   Humiliation, Afraid, Rape, and Kick questionnaire    Fear of Current or Ex-Partner: Patient declined    Emotionally Abused: Patient declined    Physically Abused: Patient declined    Sexually Abused: Patient declined   Family Status  Relation Name Status   Father MAKANA ROSTAD Sr Deceased at age 15   Mother Taim Wurm Deceased at age 75       died of old age   Other pos fam fx: Alive   Sister  Alive   Sister  Alive   Neg Hx  (Not Specified)  No partnership data on file   Family History  Problem Relation Age of Onset   Heart failure Father    Heart attack Father    Heart disease Father    Hypertension Mother    Heart Problems Mother    Heart disease Other    Hyperthyroidism Other    Diabetes Other    Cancer Neg Hx    Allergies  Allergen Reactions   Amoxicillin Rash    Patient Care Team: Malva Limes, MD as PCP - General (Family Medicine) Nancy Marus, MD (Dermatology) Thana Farr, MD as Consulting Physician (Neurology) Carmina Miller, MD as Referring Physician (Radiation Oncology) Lorraine Lax, MD as Referring Physician (Urology) Corky Sing Meryl Dare, MD as Consulting Physician (Cardiology) Lonell Face, MD as Consulting Physician (Neurology)   Medications: Outpatient Medications Prior to Visit   Medication Sig   acetaminophen (TYLENOL) 500 MG tablet Take 1,000 mg by mouth every 6 (six) hours as needed for mild pain.   apixaban (ELIQUIS) 5 MG TABS tablet Take 5 mg by mouth 2 (two) times daily.   buPROPion ER (WELLBUTRIN SR) 100 MG 12 hr tablet Take 100 mg by mouth 2 (two) times daily.   cholecalciferol (VITAMIN D3) 25 MCG (1000 UNIT) tablet Take 1,000 Units by mouth daily.   lovastatin (MEVACOR) 40 MG tablet TAKE 1 TABLET BY MOUTH EVERY DAY   metoprolol succinate (TOPROL-XL) 50 MG 24 hr tablet Take 50 mg by mouth daily.   oxymetazoline (AFRIN) 0.05 % nasal spray Place 1 spray into both nostrils 2 (two) times daily as needed for congestion.   valsartan-hydrochlorothiazide (DIOVAN-HCT) 160-25 MG tablet TAKE 1 TABLET BY MOUTH EVERY DAY   No facility-administered medications prior to visit.    Review of Systems  Constitutional:  Negative for appetite change, chills and fever.  Respiratory:  Negative for chest tightness, shortness of breath and wheezing.   Cardiovascular:  Negative for chest pain and palpitations.  Gastrointestinal:  Negative for abdominal pain, nausea and vomiting.      Objective    BP 127/75 (BP Location: Left Arm, Patient Position: Sitting, Cuff Size: Large)   Pulse 64   Resp 17   Ht 6' (1.829 m)   Wt (!) 379 lb (171.9 kg)   SpO2 98%   BMI 51.40 kg/m    Physical Exam   General Appearance:    Severely obese male. Alert, cooperative, in no acute distress, appears stated age  Head:    Normocephalic, without obvious abnormality, atraumatic  Eyes:    PERRL, conjunctiva/corneas clear, EOM's intact, fundi    benign, both eyes       Ears:    Neoplastic appearing lesion tip of right ear.   Nose:   Nares normal, septum midline, mucosa normal, no drainage   or sinus tenderness  Throat:   Lips, mucosa,  and tongue normal; teeth and gums normal  Neck:   Supple, symmetrical, trachea midline, no adenopathy;       thyroid:  No enlargement/tenderness/nodules; no  carotid   bruit or JVD  Back:     Symmetric, no curvature, ROM normal, no CVA tenderness  Lungs:     Clear to auscultation bilaterally, respirations unlabored  Chest wall:    No tenderness or deformity  Heart:    Normal heart rate. Normal rhythm. No murmurs, rubs, or gallops.  S1 and S2 normal  Abdomen:     Soft, non-tender, bowel sounds active all four quadrants,    no masses, no organomegaly  Genitalia:    deferred  Rectal:    deferred  Extremities:   All extremities are intact. No cyanosis or edema  Pulses:   2+ and symmetric all extremities  Skin:   Skin color, texture, turgor normal, no rashes or lesions  Lymph nodes:   Cervical, supraclavicular, and axillary nodes normal  Neurologic:   CNII-XII intact. Normal strength, sensation and reflexes      throughout      Assessment & Plan    Routine Health Maintenance and Physical Exam  Exercise Activities and Dietary recommendations  Goals   None     Immunization History  Administered Date(s) Administered   Fluad Quad(high Dose 65+) 10/15/2019, 08/27/2020, 08/17/2022   Fluad Trivalent(High Dose 65+) 12/30/2023   Influenza, High Dose Seasonal PF 09/10/2021   Influenza,inj,Quad PF,6+ Mos 10/08/2016   PFIZER(Purple Top)SARS-COV-2 Vaccination 11/27/2019, 12/18/2019, 08/01/2020   PNEUMOCOCCAL CONJUGATE-20 10/30/2021   Pneumococcal Polysaccharide-23 10/28/2020   Tdap 03/20/2013   Zoster Recombinant(Shingrix) 06/22/2018, 08/22/2018    Health Maintenance  Topic Date Due   DTaP/Tdap/Td (2 - Td or Tdap) 03/21/2023   COVID-19 Vaccine (4 - 2024-25 season) 07/03/2023   Medicare Annual Wellness (AWV)  12/29/2024   Colonoscopy  02/08/2027   Pneumonia Vaccine 40+ Years old  Completed   INFLUENZA VACCINE  Completed   Hepatitis C Screening  Completed   Zoster Vaccines- Shingrix  Completed   HPV VACCINES  Aged Out    Discussed health benefits of physical activity, and encouraged him to engage in regular exercise appropriate for his  age and condition.   2. Prostate cancer screening  - PSA Total (Reflex To Free)  3. Need for influenza vaccination  - Flu Vaccine Trivalent High Dose (Fluad)  4. Prescription for Tdap. Vaccine not administered in office.   - Tdap (BOOSTRIX) 5-2.5-18.5 LF-MCG/0.5 injection; Inject 0.5 mLs into the muscle once for 1 dose.  Dispense: 0.5 mL; Refill: 0  5. Need for hepatitis C screening test  - Hepatitis C antibody  6. Essential (primary) hypertension   7. Hyperglycemia  - Hemoglobin A1c  8. Pure hypercholesterolemia He is tolerating lovastatin well with no adverse effects.    - CBC - Comprehensive metabolic panel - Lipid panel  9. Acquired complete AV block (HCC)  10. Status post placement of cardiac pacemaker Continue routine cardiology fu  11. Obesity, morbid (more than 100 lbs over ideal weight or BMI > 40) (HCC) Work on diet exercise.   12. Vitamin D deficiency  - VITAMIN D 25 Hydroxy (Vit-D Deficiency, Fractures)  13. B12 deficiency  14. Myasthenia Gravis Continue regular follow up with Dr. Lorin Glass, MD  [facility] [phone] (phone) [phone] (fax)  [facility]

## 2024-02-02 DIAGNOSIS — D485 Neoplasm of uncertain behavior of skin: Secondary | ICD-10-CM | POA: Diagnosis not present

## 2024-02-02 DIAGNOSIS — C44612 Basal cell carcinoma of skin of right upper limb, including shoulder: Secondary | ICD-10-CM | POA: Diagnosis not present

## 2024-02-08 ENCOUNTER — Other Ambulatory Visit: Payer: Self-pay | Admitting: Family Medicine

## 2024-02-08 DIAGNOSIS — I10 Essential (primary) hypertension: Secondary | ICD-10-CM

## 2024-02-13 DIAGNOSIS — L814 Other melanin hyperpigmentation: Secondary | ICD-10-CM | POA: Diagnosis not present

## 2024-02-13 DIAGNOSIS — L578 Other skin changes due to chronic exposure to nonionizing radiation: Secondary | ICD-10-CM | POA: Diagnosis not present

## 2024-02-13 DIAGNOSIS — C44212 Basal cell carcinoma of skin of right ear and external auricular canal: Secondary | ICD-10-CM | POA: Diagnosis not present

## 2024-02-13 DIAGNOSIS — L988 Other specified disorders of the skin and subcutaneous tissue: Secondary | ICD-10-CM | POA: Diagnosis not present

## 2024-02-23 DIAGNOSIS — L988 Other specified disorders of the skin and subcutaneous tissue: Secondary | ICD-10-CM | POA: Diagnosis not present

## 2024-02-23 DIAGNOSIS — C44612 Basal cell carcinoma of skin of right upper limb, including shoulder: Secondary | ICD-10-CM | POA: Diagnosis not present

## 2024-03-02 DIAGNOSIS — I350 Nonrheumatic aortic (valve) stenosis: Secondary | ICD-10-CM | POA: Diagnosis not present

## 2024-03-02 DIAGNOSIS — I429 Cardiomyopathy, unspecified: Secondary | ICD-10-CM | POA: Diagnosis not present

## 2024-03-02 DIAGNOSIS — I482 Chronic atrial fibrillation, unspecified: Secondary | ICD-10-CM | POA: Diagnosis not present

## 2024-03-05 DIAGNOSIS — L814 Other melanin hyperpigmentation: Secondary | ICD-10-CM | POA: Diagnosis not present

## 2024-03-05 DIAGNOSIS — L821 Other seborrheic keratosis: Secondary | ICD-10-CM | POA: Diagnosis not present

## 2024-03-05 DIAGNOSIS — L570 Actinic keratosis: Secondary | ICD-10-CM | POA: Diagnosis not present

## 2024-03-05 DIAGNOSIS — Z4801 Encounter for change or removal of surgical wound dressing: Secondary | ICD-10-CM | POA: Diagnosis not present

## 2024-03-05 DIAGNOSIS — L905 Scar conditions and fibrosis of skin: Secondary | ICD-10-CM | POA: Diagnosis not present

## 2024-03-06 DIAGNOSIS — I442 Atrioventricular block, complete: Secondary | ICD-10-CM | POA: Diagnosis not present

## 2024-03-08 DIAGNOSIS — I482 Chronic atrial fibrillation, unspecified: Secondary | ICD-10-CM | POA: Diagnosis not present

## 2024-03-08 DIAGNOSIS — I442 Atrioventricular block, complete: Secondary | ICD-10-CM | POA: Diagnosis not present

## 2024-03-08 DIAGNOSIS — I429 Cardiomyopathy, unspecified: Secondary | ICD-10-CM | POA: Diagnosis not present

## 2024-03-08 DIAGNOSIS — I4729 Other ventricular tachycardia: Secondary | ICD-10-CM | POA: Diagnosis not present

## 2024-05-08 ENCOUNTER — Other Ambulatory Visit: Payer: Self-pay | Admitting: Family Medicine

## 2024-06-18 DIAGNOSIS — L570 Actinic keratosis: Secondary | ICD-10-CM | POA: Diagnosis not present

## 2024-06-18 DIAGNOSIS — D492 Neoplasm of unspecified behavior of bone, soft tissue, and skin: Secondary | ICD-10-CM | POA: Diagnosis not present

## 2024-06-18 DIAGNOSIS — C44311 Basal cell carcinoma of skin of nose: Secondary | ICD-10-CM | POA: Diagnosis not present

## 2024-06-18 DIAGNOSIS — L281 Prurigo nodularis: Secondary | ICD-10-CM | POA: Diagnosis not present

## 2024-06-18 DIAGNOSIS — L905 Scar conditions and fibrosis of skin: Secondary | ICD-10-CM | POA: Diagnosis not present

## 2024-06-25 ENCOUNTER — Ambulatory Visit (INDEPENDENT_AMBULATORY_CARE_PROVIDER_SITE_OTHER): Admitting: Family Medicine

## 2024-06-25 VITALS — BP 133/80 | HR 74 | Ht 72.0 in | Wt 395.0 lb

## 2024-06-25 DIAGNOSIS — K4090 Unilateral inguinal hernia, without obstruction or gangrene, not specified as recurrent: Secondary | ICD-10-CM

## 2024-06-25 DIAGNOSIS — R32 Unspecified urinary incontinence: Secondary | ICD-10-CM | POA: Diagnosis not present

## 2024-06-25 DIAGNOSIS — L600 Ingrowing nail: Secondary | ICD-10-CM | POA: Diagnosis not present

## 2024-06-25 DIAGNOSIS — Z8546 Personal history of malignant neoplasm of prostate: Secondary | ICD-10-CM

## 2024-06-25 DIAGNOSIS — R1031 Right lower quadrant pain: Secondary | ICD-10-CM

## 2024-06-25 NOTE — Progress Notes (Signed)
 Established patient visit   Patient: Garrett Woods   DOB: 05-Dec-1955   68 y.o. Male  MRN: [id number] Visit Date: 06/25/2024  Today's healthcare provider: Nancyann Perry, MD   Chief Complaint  Patient presents with   Myalgia    Patient complains of muscle pain in his legs.  Sates it has been for a few months.  He feels it when he lifts his right leg he has pain go into the groin.  Does not radiate pain down the back of his leg or up his back.  He denies any known trauma.   Subjective    Discussed the use of AI scribe software for clinical note transcription with the patient, who gave verbal consent to proceed.  History of Present Illness   Garrett Woods is a 68 year old male who presents with right leg pain.  He has been experiencing pain in his right leg for a few months, initially suspecting a pulled groin muscle despite no specific injury. The pain has improved somewhat but persists when lifting his leg, particularly when sitting or attempting to lift his leg onto the bed. The pain is localized to the groin area and does not occur when standing or walking. He denies any history of hernias.  He has a history of prostate cancer treated years ago and experiences bladder control issues, including urgency and incomplete emptying. He gets up at night to urinate, sometimes once, sometimes up to three times. He has not seen a urologist in years.  He mentions having neuropathy and drop foot, which are ongoing issues. There is a discussion about Georjean being used by a neighbor for neuropathy, but he is not aware of this use.  He has a new ingrown toenail on his right big toe, which does not always cause pain but is noticeable. He has not seen a podiatrist for this issue.       Medications: Outpatient Medications Prior to Visit  Medication Sig   acetaminophen  (TYLENOL ) 500 MG tablet Take 1,000 mg by mouth every 6 (six) hours as needed for mild pain.   apixaban (ELIQUIS) 5  MG TABS tablet Take 5 mg by mouth 2 (two) times daily.   cholecalciferol (VITAMIN D3) 25 MCG (1000 UNIT) tablet Take 1,000 Units by mouth daily.   lovastatin  (MEVACOR ) 40 MG tablet TAKE 1 TABLET BY MOUTH EVERY DAY   metoprolol succinate (TOPROL-XL) 50 MG 24 hr tablet Take 50 mg by mouth daily.   oxymetazoline (AFRIN) 0.05 % nasal spray Place 1 spray into both nostrils 2 (two) times daily as needed for congestion.   valsartan -hydrochlorothiazide  (DIOVAN -HCT) 160-25 MG tablet TAKE 1 TABLET BY MOUTH EVERY DAY   buPROPion ER (WELLBUTRIN SR) 100 MG 12 hr tablet Take 100 mg by mouth 2 (two) times daily.   No facility-administered medications prior to visit.   Review of Systems     Objective    BP 133/80 (BP Location: Left Arm, Patient Position: Sitting, Cuff Size: Large)   Pulse 74   Ht 6' (1.829 m)   Wt (!) 395 lb (179.2 kg)   SpO2 98%   BMI 53.57 kg/m   Physical Exam  Physical Exam   GENITOURINARY: Tenderness in right groin area on palpation. Small hernia in right groin area. No thigh tenderness or swelling.       Assessment & Plan    1. Right inguinal pain (Primary) Likely secondary to hernia, may be inguinal strain as well. Is  to call back for surgery referral if pain worsens or a bulge develops. Otherwise is to avoid strenuous activities.   2. Right inguinal hernia   3. Ingrown right big toenail  - Ambulatory referral to Podiatry  4. Urinary incontinence, unspecified type   5. History of prostate cancer  - Ambulatory referral to Urology      Nancyann Perry, MD  [facility] [phone] (phone) [phone] (fax)  [facility]

## 2024-07-10 DIAGNOSIS — C4362 Malignant melanoma of left upper limb, including shoulder: Secondary | ICD-10-CM | POA: Diagnosis not present

## 2024-07-10 DIAGNOSIS — D485 Neoplasm of uncertain behavior of skin: Secondary | ICD-10-CM | POA: Diagnosis not present

## 2024-07-11 DIAGNOSIS — C4360 Malignant melanoma of unspecified upper limb, including shoulder: Secondary | ICD-10-CM | POA: Diagnosis not present

## 2024-07-13 ENCOUNTER — Ambulatory Visit: Admitting: Family Medicine

## 2024-07-20 DIAGNOSIS — I4811 Longstanding persistent atrial fibrillation: Secondary | ICD-10-CM | POA: Diagnosis not present

## 2024-07-20 DIAGNOSIS — E785 Hyperlipidemia, unspecified: Secondary | ICD-10-CM | POA: Diagnosis not present

## 2024-07-20 DIAGNOSIS — I10 Essential (primary) hypertension: Secondary | ICD-10-CM | POA: Diagnosis not present

## 2024-07-20 DIAGNOSIS — Z950 Presence of cardiac pacemaker: Secondary | ICD-10-CM | POA: Diagnosis not present

## 2024-08-01 DIAGNOSIS — Z950 Presence of cardiac pacemaker: Secondary | ICD-10-CM | POA: Diagnosis not present

## 2024-08-01 DIAGNOSIS — I442 Atrioventricular block, complete: Secondary | ICD-10-CM | POA: Diagnosis not present

## 2024-08-05 ENCOUNTER — Other Ambulatory Visit: Payer: Self-pay | Admitting: Family Medicine

## 2024-08-06 DIAGNOSIS — C4362 Malignant melanoma of left upper limb, including shoulder: Secondary | ICD-10-CM | POA: Diagnosis not present

## 2024-08-06 DIAGNOSIS — C4360 Malignant melanoma of unspecified upper limb, including shoulder: Secondary | ICD-10-CM | POA: Diagnosis not present

## 2024-08-07 DIAGNOSIS — Z950 Presence of cardiac pacemaker: Secondary | ICD-10-CM | POA: Diagnosis not present

## 2024-08-07 DIAGNOSIS — C4362 Malignant melanoma of left upper limb, including shoulder: Secondary | ICD-10-CM | POA: Diagnosis not present

## 2024-08-07 DIAGNOSIS — I10 Essential (primary) hypertension: Secondary | ICD-10-CM | POA: Diagnosis not present

## 2024-08-07 DIAGNOSIS — I4811 Longstanding persistent atrial fibrillation: Secondary | ICD-10-CM | POA: Diagnosis not present

## 2024-08-07 DIAGNOSIS — E785 Hyperlipidemia, unspecified: Secondary | ICD-10-CM | POA: Diagnosis not present

## 2024-08-07 DIAGNOSIS — Z6841 Body Mass Index (BMI) 40.0 and over, adult: Secondary | ICD-10-CM | POA: Diagnosis not present

## 2024-08-07 DIAGNOSIS — G7000 Myasthenia gravis without (acute) exacerbation: Secondary | ICD-10-CM | POA: Diagnosis not present

## 2024-08-07 DIAGNOSIS — Z87891 Personal history of nicotine dependence: Secondary | ICD-10-CM | POA: Diagnosis not present

## 2024-08-20 DIAGNOSIS — C4360 Malignant melanoma of unspecified upper limb, including shoulder: Secondary | ICD-10-CM | POA: Diagnosis not present

## 2024-08-22 ENCOUNTER — Ambulatory Visit: Admitting: Urology

## 2024-09-03 DIAGNOSIS — C4360 Malignant melanoma of unspecified upper limb, including shoulder: Secondary | ICD-10-CM | POA: Diagnosis not present

## 2024-09-06 DIAGNOSIS — I482 Chronic atrial fibrillation, unspecified: Secondary | ICD-10-CM | POA: Diagnosis not present

## 2024-09-06 DIAGNOSIS — I429 Cardiomyopathy, unspecified: Secondary | ICD-10-CM | POA: Diagnosis not present

## 2024-09-06 DIAGNOSIS — I442 Atrioventricular block, complete: Secondary | ICD-10-CM | POA: Diagnosis not present

## 2024-09-06 DIAGNOSIS — Z950 Presence of cardiac pacemaker: Secondary | ICD-10-CM | POA: Diagnosis not present

## 2024-09-13 DIAGNOSIS — G7000 Myasthenia gravis without (acute) exacerbation: Secondary | ICD-10-CM | POA: Diagnosis not present

## 2024-09-13 DIAGNOSIS — H43813 Vitreous degeneration, bilateral: Secondary | ICD-10-CM | POA: Diagnosis not present

## 2024-09-13 DIAGNOSIS — Z961 Presence of intraocular lens: Secondary | ICD-10-CM | POA: Diagnosis not present

## 2024-09-13 DIAGNOSIS — H04123 Dry eye syndrome of bilateral lacrimal glands: Secondary | ICD-10-CM | POA: Diagnosis not present

## 2024-09-18 ENCOUNTER — Ambulatory Visit: Admitting: Urology

## 2024-10-01 DIAGNOSIS — C4360 Malignant melanoma of unspecified upper limb, including shoulder: Secondary | ICD-10-CM | POA: Diagnosis not present

## 2024-11-07 ENCOUNTER — Other Ambulatory Visit: Payer: Self-pay | Admitting: Family Medicine

## 2024-12-03 ENCOUNTER — Ambulatory Visit: Admitting: Urology
